# Patient Record
Sex: Male | Born: 1960 | Race: Black or African American | Hispanic: No | Marital: Single | Smoking: Former smoker
Health system: Southern US, Community
[De-identification: ages and names within clinical notes are randomized; demographics above are authoritative.]

## PROBLEM LIST (undated history)

## (undated) DIAGNOSIS — B1920 Unspecified viral hepatitis C without hepatic coma: Secondary | ICD-10-CM

## (undated) DIAGNOSIS — D649 Anemia, unspecified: Secondary | ICD-10-CM

## (undated) DIAGNOSIS — I2699 Other pulmonary embolism without acute cor pulmonale: Secondary | ICD-10-CM

## (undated) DIAGNOSIS — C220 Liver cell carcinoma: Secondary | ICD-10-CM

## (undated) DIAGNOSIS — M109 Gout, unspecified: Secondary | ICD-10-CM

## (undated) HISTORY — DX: Gout, unspecified: M10.9

---

## 2006-07-06 ENCOUNTER — Emergency Department: Payer: Self-pay | Admitting: Emergency Medicine

## 2012-07-17 ENCOUNTER — Emergency Department: Payer: Self-pay | Admitting: Emergency Medicine

## 2013-03-02 ENCOUNTER — Emergency Department: Payer: Self-pay | Admitting: Internal Medicine

## 2017-10-07 ENCOUNTER — Emergency Department
Admission: EM | Admit: 2017-10-07 | Discharge: 2017-10-07 | Disposition: A | Discharge status: Home / Self Care | Payer: Medicaid Other | Attending: Emergency Medicine | Admitting: Emergency Medicine

## 2017-10-07 ENCOUNTER — Emergency Department: Payer: Medicaid Other

## 2017-10-07 ENCOUNTER — Encounter: Payer: Self-pay | Admitting: Emergency Medicine

## 2017-10-07 ENCOUNTER — Other Ambulatory Visit: Payer: Self-pay

## 2017-10-07 DIAGNOSIS — W3182XA Contact with other commercial machinery, initial encounter: Secondary | ICD-10-CM | POA: Insufficient documentation

## 2017-10-07 DIAGNOSIS — S63297A Dislocation of distal interphalangeal joint of left little finger, initial encounter: Secondary | ICD-10-CM | POA: Diagnosis not present

## 2017-10-07 DIAGNOSIS — F17210 Nicotine dependence, cigarettes, uncomplicated: Secondary | ICD-10-CM | POA: Insufficient documentation

## 2017-10-07 DIAGNOSIS — Y939 Activity, unspecified: Secondary | ICD-10-CM | POA: Diagnosis not present

## 2017-10-07 DIAGNOSIS — Y929 Unspecified place or not applicable: Secondary | ICD-10-CM | POA: Diagnosis not present

## 2017-10-07 DIAGNOSIS — S63259A Unspecified dislocation of unspecified finger, initial encounter: Secondary | ICD-10-CM

## 2017-10-07 DIAGNOSIS — Y999 Unspecified external cause status: Secondary | ICD-10-CM | POA: Insufficient documentation

## 2017-10-07 DIAGNOSIS — S61217A Laceration without foreign body of left little finger without damage to nail, initial encounter: Secondary | ICD-10-CM

## 2017-10-07 DIAGNOSIS — S6992XA Unspecified injury of left wrist, hand and finger(s), initial encounter: Secondary | ICD-10-CM | POA: Diagnosis present

## 2017-10-07 MED ORDER — HYDROCODONE-ACETAMINOPHEN 5-325 MG PO TABS: 1.0000 | ORAL_TABLET | ORAL | 0 refills | Status: DC | PRN

## 2017-10-07 MED ORDER — MORPHINE SULFATE (PF) 4 MG/ML IV SOLN
4.0000 mg | Freq: Once | INTRAVENOUS | Status: AC
  Administered 2017-10-07: 4 mg via INTRAVENOUS

## 2017-10-07 MED ORDER — CEPHALEXIN 500 MG PO CAPS
500.0000 mg | ORAL_CAPSULE | Freq: Once | ORAL | Status: AC
  Administered 2017-10-07: 500 mg via ORAL
  Filled 2017-10-07: qty 1

## 2017-10-07 MED ORDER — LIDOCAINE HCL (PF) 1 % IJ SOLN
INTRAMUSCULAR | Status: AC
  Filled 2017-10-07: qty 10

## 2017-10-07 MED ORDER — TETANUS-DIPHTH-ACELL PERTUSSIS 5-2.5-18.5 LF-MCG/0.5 IM SUSP
0.5000 mL | Freq: Once | INTRAMUSCULAR | Status: DC
  Filled 2017-10-07: qty 0.5

## 2017-10-07 MED ORDER — ONDANSETRON HCL 4 MG/2ML IJ SOLN
4.0000 mg | Freq: Once | INTRAMUSCULAR | Status: AC
  Administered 2017-10-07: 4 mg via INTRAVENOUS

## 2017-10-07 MED ORDER — MORPHINE SULFATE (PF) 4 MG/ML IV SOLN
INTRAVENOUS | Status: AC
  Administered 2017-10-07: 4 mg via INTRAVENOUS
  Filled 2017-10-07: qty 1

## 2017-10-07 MED ORDER — CEPHALEXIN 500 MG PO CAPS: 500.0000 mg | ORAL_CAPSULE | Freq: Three times a day (TID) | ORAL | 0 refills | Status: DC

## 2017-10-07 MED ORDER — ONDANSETRON HCL 4 MG/2ML IJ SOLN
INTRAMUSCULAR | Status: AC
  Administered 2017-10-07: 4 mg via INTRAVENOUS
  Filled 2017-10-07: qty 2

## 2017-10-07 NOTE — ED Notes (Signed)
Pt discharged to appointment with [facility] after verbalizing understanding of discharge instructions; nad noted.

## 2017-10-07 NOTE — ED Notes (Signed)
Pt presents with hand wrapped. When he arrived in room, stated that it is no longer numb and "hurts real bad." Pt writhing around and diaphoretic during assessment.

## 2017-10-07 NOTE — ED Notes (Signed)
Pt hand wrapped and finger splint placed.

## 2017-10-07 NOTE — ED Triage Notes (Signed)
Pt got left fourth and fifth digit caught in a drill, bone protruding through skin, bleeding control, hand is pink, positive pulses, pt reports fingers are numb

## 2017-10-07 NOTE — Discharge Instructions (Signed)
You have been seen in the emergency department following a dislocation of your fourth and fifth fingers of her left hand.  The dislocation was reduced in both fingers however as you did suffer from a laceration with exposed bone to the left fifth finger please proceed directly to orthopedics for further evaluation, they are expecting her arrival.  Your tetanus vaccine has been updated today.  Please take your antibiotics and pain medication as written.  Return to the emergency department for any significant increase in pain, any numbness or coldness of the fingers, or any signs of infection such as increased redness, pus or fever.

## 2017-10-07 NOTE — ED Provider Notes (Signed)
[facility] Emergency Department Provider Note  Time seen: 9:21 AM  I have reviewed the triage vital signs and the nursing notes.   HISTORY  Chief Complaint Laceration    HPI Vernon [name] is a 56 y.o. male with no past medical history who presents to the emergency department for left hand pain.  According to the patient he was using a drill mixer.  He states his left gloved hand got stuck in the drill and was pulled into the drill mixer.  Patient states significant pain in the left hand denies any other pain or injuries.  Largely negative review of systems.  History reviewed. No pertinent past medical history.  There are no active problems to display for this patient.   History reviewed. No pertinent surgical history.  Prior to Admission medications   Not on File    No Known Allergies  No family history on file.  Social History Social History   Tobacco Use  . Smoking status: Current Every Day Smoker    Packs/day: 1.00    Types: Cigarettes  Substance Use Topics  . Alcohol use: Yes  . Drug use: Not on file    Review of Systems Constitutional: Negative for LOC Cardiovascular: Negative for chest pain. Respiratory: Negative for shortness of breath. Gastrointestinal: Negative for abdominal pain Musculoskeletal: Left hand pain Neurological: Negative for headache All other ROS negative  ____________________________________________   PHYSICAL EXAM:  Constitutional: Alert and oriented. Well appearing, mild distress due to pain Eyes: Normal exam ENT   Head: Normocephalic and atraumatic.   Mouth/Throat: Mucous membranes are moist. Cardiovascular: Normal rate, regular rhythm. No murmur Respiratory: Normal respiratory effort without tachypnea nor retractions. Breath sounds are clear Gastrointestinal: Soft and nontender. No distention.  Musculoskeletal: Patient has deformity to the left fourth finger, no laceration.  Neurovascularly  intact distally with good cap refill and states normal sensation.  Patient has a deformed left fifth finger with a laceration overlying the PIP joint, with exposed tendon.  Neurovascularly intact distally with good cap refill and normal sensation. Neurologic:  Normal speech and language. No gross focal neurologic deficits Skin:  Skin is warm, dry.  1.5 cm laceration over the palmar aspect of the fifth finger/PIP area. Psychiatric: Mood and affect are normal.   ____________________________________________   RADIOLOGY  IMPRESSION: Dislocations at the fourth and fifth PIP joint levels with dislocation dorsally of the fourth and fifth middle and distal phalanges with respect to the root proximal phalanges. Small avulsion, probably arising from the proximal aspect of the fifth middle phalanx with the small avulsed fragment noted volar to the distal aspect of the fifth proximal phalanx.  No other fractures or dislocation. Areas of osteoarthritic change laterally.  ____________________________________________   INITIAL IMPRESSION / ASSESSMENT AND PLAN / ED COURSE  Pertinent labs & imaging results that were available during my care of the patient were reviewed by me and considered in my medical decision making (see chart for details).  The patient presents to the emergency department after getting his hand stuck in drill mixer.  Differential includes laceration, dislocation, fracture, tendon/ligament injury.  On exam patient appears to have a dislocation of the fourth finger with a dislocation possible fracture of the fifth finger with a laceration over the fifth finger of the left hand.  Both of which are neurovascular intact with good capillary refill and normal sensation.  We will obtain an x-ray.  I performed a digital block on the fourth and fifth fingers for pain  relief.  We will also dose pain medication and continue to closely monitor in the emergency department.  Patient agreeable to  this plan.  X-ray shows dislocations of the fourth and fifth digits without fracture.  After digital block I was able to reduce the fourth digit.  Extensively irrigated the fifth PIP joint and extruding bone prior to reduction.  After the digits were reduced I again assessed each joint and all tendons appeared to be intact patient able to flex each phalanx when the joint was held stable.  Continues to have good capillary refill.  Patient was neurologically intact prior to digital blocks.  I discussed the patient with orthopedics who recommends suturing the laceration and having the patient proceed directly to their office across the street for evaluation.  Patient is agreeable to this plan.  He is not sure when his last tetanus shot was we will update his tetanus in the emergency department.  We will place the patient on pain medication as well as antibiotics as the bone was protruding from the skin.  The patient understands he is to proceed directly to [facility] for further evaluation.  Reduction of dislocation Date/Time: 10:03 AM Performed by: Harvest Dark Authorized by: Harvest Dark Consent: Verbal consent obtained. Consent given by: patient Required items: required equipment available  Patient sedated: No (Digital block  Vitals: Vital signs were monitored during sedation. Patient tolerance: Patient tolerated the procedure well with no immediate complications. Joint: left 4th and 5th PIPs Reduction technique: traction  LACERATION REPAIR Performed by: Harvest Dark Authorized by: Harvest Dark Consent: Verbal consent obtained. Risks and benefits: risks, benefits and alternatives were discussed Consent given by: patient Patient identity confirmed: provided demographic data Prepped and Draped in normal sterile fashion Wound explored  Laceration Location: left 5th PIP   Laceration Length: 1.5cm  No Foreign Bodies seen or palpated  Anesthesia: local  infiltration  Local anesthetic: lidocaine 1% w/o epinephrine (digital block)  Anesthetic total: 4 ml  Irrigation method: syringe Amount of cleaning: standard  Skin closure: 4-0 prolene  Number of sutures: 4  Technique: simple interrupted  Patient tolerance: Patient tolerated the procedure well with no immediate complications.     ____________________________________________   FINAL CLINICAL IMPRESSION(S) / ED DIAGNOSES  Left hand pain    Harvest Dark, MD 10/07/17 1005

## 2017-12-20 ENCOUNTER — Other Ambulatory Visit: Payer: Self-pay | Admitting: *Deleted

## 2017-12-20 ENCOUNTER — Ambulatory Visit
Admission: RE | Admit: 2017-12-20 | Discharge: 2017-12-20 | Disposition: A | Discharge status: Home / Self Care | Payer: Disability Insurance | Source: Ambulatory Visit | Attending: Physician Assistant | Admitting: Physician Assistant

## 2017-12-20 ENCOUNTER — Ambulatory Visit
Admission: RE | Admit: 2017-12-20 | Discharge: 2017-12-20 | Disposition: A | Discharge status: Home / Self Care | Payer: Disability Insurance | Source: Ambulatory Visit | Attending: *Deleted | Admitting: *Deleted

## 2017-12-20 DIAGNOSIS — M25561 Pain in right knee: Secondary | ICD-10-CM

## 2017-12-20 DIAGNOSIS — M25562 Pain in left knee: Secondary | ICD-10-CM | POA: Insufficient documentation

## 2018-05-25 ENCOUNTER — Emergency Department
Admission: EM | Admit: 2018-05-25 | Discharge: 2018-05-25 | Disposition: A | Discharge status: Home / Self Care | Payer: Medicaid Other | Attending: Emergency Medicine | Admitting: Emergency Medicine

## 2018-05-25 ENCOUNTER — Encounter: Payer: Self-pay | Admitting: Emergency Medicine

## 2018-05-25 ENCOUNTER — Other Ambulatory Visit: Payer: Self-pay

## 2018-05-25 DIAGNOSIS — M25571 Pain in right ankle and joints of right foot: Secondary | ICD-10-CM | POA: Diagnosis present

## 2018-05-25 DIAGNOSIS — Z79899 Other long term (current) drug therapy: Secondary | ICD-10-CM | POA: Diagnosis not present

## 2018-05-25 DIAGNOSIS — M10071 Idiopathic gout, right ankle and foot: Secondary | ICD-10-CM | POA: Insufficient documentation

## 2018-05-25 DIAGNOSIS — F17210 Nicotine dependence, cigarettes, uncomplicated: Secondary | ICD-10-CM | POA: Insufficient documentation

## 2018-05-25 DIAGNOSIS — R7309 Other abnormal glucose: Secondary | ICD-10-CM

## 2018-05-25 LAB — CBC WITH DIFFERENTIAL/PLATELET
BASOS ABS: 0 10*3/uL (ref 0–0.1)
BASOS PCT: 0 %
EOS ABS: 0.1 10*3/uL (ref 0–0.7)
Eosinophils Relative: 3 %
HEMATOCRIT: 40.5 % (ref 40.0–52.0)
HEMOGLOBIN: 13.7 g/dL (ref 13.0–18.0)
Lymphocytes Relative: 34 %
Lymphs Abs: 1.6 10*3/uL (ref 1.0–3.6)
MCH: 34.5 pg — ABNORMAL HIGH (ref 26.0–34.0)
MCHC: 33.9 g/dL (ref 32.0–36.0)
MCV: 101.7 fL — ABNORMAL HIGH (ref 80.0–100.0)
Monocytes Absolute: 0.5 10*3/uL (ref 0.2–1.0)
Monocytes Relative: 10 %
NEUTROS ABS: 2.5 10*3/uL (ref 1.4–6.5)
Neutrophils Relative %: 53 %
Platelets: 105 10*3/uL — ABNORMAL LOW (ref 150–440)
RBC: 3.98 MIL/uL — AB (ref 4.40–5.90)
RDW: 14.1 % (ref 11.5–14.5)
WBC: 4.7 10*3/uL (ref 3.8–10.6)

## 2018-05-25 LAB — BASIC METABOLIC PANEL
Anion gap: 9 (ref 5–15)
BUN: 25 mg/dL — ABNORMAL HIGH (ref 6–20)
CHLORIDE: 103 mmol/L (ref 98–111)
CO2: 26 mmol/L (ref 22–32)
Calcium: 9.4 mg/dL (ref 8.9–10.3)
Creatinine, Ser: 1.21 mg/dL (ref 0.61–1.24)
GFR calc Af Amer: >60 mL/min (ref >60)
GFR calc non Af Amer: >60 mL/min (ref >60)
GLUCOSE: 131 mg/dL — AB (ref 70–99)
POTASSIUM: 5 mmol/L (ref 3.5–5.1)
Sodium: 138 mmol/L (ref 135–145)

## 2018-05-25 LAB — URINALYSIS, COMPLETE (UACMP) WITH MICROSCOPIC
BILIRUBIN URINE: NEGATIVE
Bacteria, UA: NONE SEEN
GLUCOSE, UA: NEGATIVE mg/dL
HGB URINE DIPSTICK: NEGATIVE
KETONES UR: NEGATIVE mg/dL
LEUKOCYTES UA: NEGATIVE
NITRITE: NEGATIVE
PH: 5 (ref 5.0–8.0)
Protein, ur: NEGATIVE mg/dL
SPECIFIC GRAVITY, URINE: 1.016 (ref 1.005–1.030)
Squamous Epithelial / LPF: NONE SEEN (ref 0–5)

## 2018-05-25 LAB — SEDIMENTATION RATE: SED RATE: 9 mm/h (ref 0–20)

## 2018-05-25 LAB — URIC ACID: URIC ACID, SERUM: 9.6 mg/dL — AB (ref 3.7–8.6)

## 2018-05-25 MED ORDER — COLCHICINE 0.6 MG PO TABS: 0.6000 mg | ORAL_TABLET | Freq: Every day | ORAL | 0 refills | Status: DC

## 2018-05-25 MED ORDER — SODIUM CHLORIDE 0.9 % IV BOLUS
1000.0000 mL | Freq: Once | INTRAVENOUS | Status: AC
  Administered 2018-05-25: 1000 mL via INTRAVENOUS

## 2018-05-25 MED ORDER — CYCLOBENZAPRINE HCL 10 MG PO TABS: 10.0000 mg | ORAL_TABLET | Freq: Three times a day (TID) | ORAL | 0 refills | Status: DC | PRN

## 2018-05-25 NOTE — ED Provider Notes (Addendum)
[facility] Emergency Department Provider Note   ____________________________________________   First MD Initiated Contact with Patient 05/25/18 1057     (approximate)  I have reviewed the triage vital signs and the nursing notes.   HISTORY  Chief Complaint Leg Pain    HPI Vernon [name] is a 57 y.o. male patient complained of 3 weeks of bilateral leg and ankle cramping.  Patient stated no history of provocative incident except for working outside.  Patient denies dyspnea, chest pain or leg numbness.  Patient has degenerative changes in the patellofemoral compartments bilaterally.  Patient rates his pain discomfort a 10/10.  Patient described the pain as "crampy".  No relief over-the-counter anti-inflammatory medications.   History reviewed. No pertinent past medical history.  There are no active problems to display for this patient.   History reviewed. No pertinent surgical history.  Prior to Admission medications   Medication Sig Start Date End Date Taking? Authorizing Provider  cephALEXin (KEFLEX) 500 MG capsule Take 1 capsule (500 mg total) 3 (three) times daily by mouth. 10/07/17   Harvest Dark, MD  colchicine 0.6 MG tablet Take 1 tablet (0.6 mg total) by mouth daily. 05/25/18 05/25/19  Sable Feil, PA-C  cyclobenzaprine (FLEXERIL) 10 MG tablet Take 1 tablet (10 mg total) by mouth 3 (three) times daily as needed. 05/25/18   Sable Feil, PA-C  HYDROcodone-acetaminophen (NORCO/VICODIN) 5-325 MG tablet Take 1 tablet every 4 (four) hours as needed by mouth. 10/07/17   Harvest Dark, MD    Allergies Patient has no known allergies.  No family history on file.  Social History Social History   Tobacco Use  . Smoking status: Current Every Day Smoker    Packs/day: 1.00    Types: Cigarettes  . Smokeless tobacco: Former Network engineer Use Topics  . Alcohol use: Yes  . Drug use: Not on file    Review of Systems Constitutional: No  fever/chills Eyes: No visual changes. ENT: No sore throat. Cardiovascular: Denies chest pain. Respiratory: Denies shortness of breath. Gastrointestinal: No abdominal pain.  No nausea, no vomiting.  No diarrhea.  No constipation. Genitourinary: Negative for dysuria. Musculoskeletal: Bilateral leg cramping.   Skin: Negative for rash. Neurological: Negative for headaches, focal weakness or numbness.   ____________________________________________   PHYSICAL EXAM:  VITAL SIGNS: ED Triage Vitals  Enc Vitals Group     BP 05/25/18 1015 (!) 128/114     Pulse Rate 05/25/18 1013 (!) 104     Resp --      Temp 05/25/18 1013 98.2 F (36.8 C)     Temp Source 05/25/18 1013 Oral     SpO2 05/25/18 1013 98 %     Weight 05/25/18 1014 170 lb (77.1 kg)     Height 05/25/18 1014 5\' 6"  (1.676 m)     Head Circumference --      Peak Flow --      Pain Score 05/25/18 1020 10     Pain Loc --      Pain Edu? --      Excl. in [place]? --    Constitutional: Alert and oriented. Well appearing and in no acute distress. Neck: No stridor. Cardiovascular: Normal rate, regular rhythm. Grossly normal heart sounds.  Good peripheral circulation. Respiratory: Normal respiratory effort.  No retractions. Lungs CTAB. Musculoskeletal: No lower extremity tenderness nor edema.  No joint effusions. Neurologic:  Normal speech and language. No gross focal neurologic deficits are appreciated. No gait instability. Skin:  Skin is warm, dry and intact. No rash noted. Psychiatric: Mood and affect are normal. Speech and behavior are normal.  ____________________________________________   LABS (all labs ordered are listed, but only abnormal results are displayed)  Labs Reviewed  BASIC METABOLIC PANEL - Abnormal; Notable for the following components:      Result Value   Glucose, Bld 131 (*)    BUN 25 (*)    All other components within normal limits  CBC WITH DIFFERENTIAL/PLATELET - Abnormal; Notable for the following  components:   RBC 3.98 (*)    MCV 101.7 (*)    MCH 34.5 (*)    Platelets 105 (*)    All other components within normal limits  URIC ACID - Abnormal; Notable for the following components:   Uric Acid, Serum 9.6 (*)    All other components within normal limits  URINALYSIS, COMPLETE (UACMP) WITH MICROSCOPIC - Abnormal; Notable for the following components:   Color, Urine YELLOW (*)    APPearance CLEAR (*)    All other components within normal limits  SEDIMENTATION RATE   ____________________________________________  EKG   ____________________________________________  RADIOLOGY  ED MD interpretation:    Official radiology report(s): No results found.  ____________________________________________   PROCEDURES  Procedure(s) performed: None  Procedures  Critical Care performed: No  ____________________________________________   INITIAL IMPRESSION / ASSESSMENT AND PLAN / ED COURSE  As part of my medical decision making, I reviewed the following data within the [place]    Patient presents with bilateral leg pain mostly around the ankles.  Patient also complaining of leg cramps.  Discussed lab results with patient with concern for elevated glucose, uric acid and BUN.  Patient was rehydrated with 1000 cc of normal saline.  Patient advised to establish care with the [facility] for further evaluation.  Patient given a prescription for colchicine and Flexeril.     ____________________________________________   FINAL CLINICAL IMPRESSION(S) / ED DIAGNOSES  Final diagnoses:  Idiopathic gout of right ankle, unspecified chronicity  Elevated glucose     ED Discharge Orders        Ordered    colchicine 0.6 MG tablet  Daily     05/25/18 1245    cyclobenzaprine (FLEXERIL) 10 MG tablet  3 times daily PRN     05/25/18 1245       Note:  This document was prepared using Dragon voice recognition software and may include unintentional  dictation errors.    Sable Feil, PA-C 05/25/18 1248    Sable Feil, PA-C 05/25/18 1249    Earleen Newport, MD 05/25/18 [phone]

## 2018-05-25 NOTE — Discharge Instructions (Addendum)
Advised to contact the [facility] to schedule appointment to establish care and further evaluate elevated glucose and uric acid.

## 2018-05-25 NOTE — ED Triage Notes (Signed)
Pt states left leg and ankle pain for a few weeks now, denies injury to area.

## 2018-05-25 NOTE — ED Notes (Signed)
See triage note  Presents with pain to left leg  Stats cramping started at feet and moves up into lag  Sx's started several weeks ago  No injury  Ambulates well to room

## 2018-06-13 ENCOUNTER — Ambulatory Visit (INDEPENDENT_AMBULATORY_CARE_PROVIDER_SITE_OTHER): Payer: Medicaid Other | Admitting: Nurse Practitioner

## 2018-06-13 ENCOUNTER — Encounter: Payer: Self-pay | Admitting: Nurse Practitioner

## 2018-06-13 ENCOUNTER — Other Ambulatory Visit: Payer: Self-pay

## 2018-06-13 VITALS — BP 114/72 | HR 100 | Temp 98.5°F | Ht 66.0 in | Wt 161.2 lb

## 2018-06-13 DIAGNOSIS — M109 Gout, unspecified: Secondary | ICD-10-CM | POA: Diagnosis not present

## 2018-06-13 DIAGNOSIS — R945 Abnormal results of liver function studies: Secondary | ICD-10-CM

## 2018-06-13 DIAGNOSIS — Z13220 Encounter for screening for lipoid disorders: Secondary | ICD-10-CM | POA: Diagnosis not present

## 2018-06-13 DIAGNOSIS — R42 Dizziness and giddiness: Secondary | ICD-10-CM

## 2018-06-13 DIAGNOSIS — R160 Hepatomegaly, not elsewhere classified: Secondary | ICD-10-CM

## 2018-06-13 DIAGNOSIS — D7589 Other specified diseases of blood and blood-forming organs: Secondary | ICD-10-CM

## 2018-06-13 DIAGNOSIS — R7989 Other specified abnormal findings of blood chemistry: Secondary | ICD-10-CM

## 2018-06-13 DIAGNOSIS — K769 Liver disease, unspecified: Secondary | ICD-10-CM

## 2018-06-13 MED ORDER — ALLOPURINOL 100 MG PO TABS: 100.0000 mg | ORAL_TABLET | Freq: Every day | ORAL | 6 refills | Status: DC

## 2018-06-13 NOTE — Progress Notes (Signed)
Subjective:    Patient ID: Vernon [name], male    DOB: [birth year], 59 y.o.   MRN: [id number]  Vernon [name] is a 57 y.o. male presenting on 06/13/2018 for Leg Pain (bilateral leg and ankle cramping mostly in the left x 5 weeks. Left leg cramping is worse at bedtime) and Establish Care (dizziness w/ sudden movement. x 6 mths)   HPI Establish Care New Provider Pt never seen by PCP in recent or remote past.  Patient was most recently cared for in ED at [facility] for episodic care.   Dizziness occus intermittently and not every day.  Over last 6 weeks is more regular.  Started about 6 months ago with very intermittent symptoms. - Is drinking water regularly at least 1/2 - 1 gallon daily.  History of Gout - Takes colchicine about 4 times per week since flare 2 weeks ago. - Patient has not had any uric acid level drawn or preventative medication in past for gout prevention.  Macrocytosis Found on CBC in ED.  No prior treatment.  No other workup.  Patient has no prior knowledge about this change.  He is regular consumer of alcohol and is reluctant to share his alcohol drinking history.  Currently drinks 1-2 beer per night, but these are 40-oz beers. - Eats lunch, breakfast occasionally, supper daily.  Supper is usually very small meal.  He never eats meals out.  Prepares food at home.   - Has meat product daily.  Past Medical History:  Diagnosis Date  . Gout    History reviewed. No pertinent surgical history.   Social History   Socioeconomic History  . Marital status: Single    Spouse name: Not on file  . Number of children: 3  . Years of education: 10  . Highest education level: 10th grade  Occupational History  . 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 Financial resource strain: Somewhat hard  . Food insecurity:    Worry: Sometimes true    Inability: Sometimes true  . Transportation needs:    Medical: No    Non-medical: No  Tobacco  Use  . Smoking status: Current Every Day Smoker    Packs/day: 1.00    Types: Cigarettes  . Smokeless tobacco: Former Network engineer and Sexual Activity  . Alcohol use: Yes    Alcohol/week: 4.2 oz    Types: 7 Cans of beer per week    Comment: daily  . Drug use: Never  . Sexual activity: Not Currently  Lifestyle  . Physical activity:    Days per week: 2 days    Minutes per session: Not on file  . Stress: To some extent  Relationships  . Social connections:    Talks on phone: Not on file    Gets together: Not on file    Attends religious service: Not on file    Active member of club or organization: Not on file    Attends meetings of clubs or organizations: Not on file    Relationship status: Not on file  . Intimate partner violence:    Fear of current or ex partner: Not on file    Emotionally abused: Not on file    Physically abused: Not on file    Forced sexual activity: Not on file  Other Topics Concern  . Not on file  Social History Narrative  . Not on file   Family History  Problem Relation Age  of Onset  . Cancer Mother   . Cancer Father   . Hypertension Maternal Uncle   . Healthy Daughter   . Healthy Son    Current Outpatient Medications on File Prior to Visit  Medication Sig  . colchicine 0.6 MG tablet Take 1 tablet (0.6 mg total) by mouth daily.  . cyclobenzaprine (FLEXERIL) 10 MG tablet Take 1 tablet (10 mg total) by mouth 3 (three) times daily as needed. (Patient not taking: Reported on 06/13/2018)   No current facility-administered medications on file prior to visit.     Review of Systems Per HPI unless specifically indicated above     Objective:    BP 114/72 (BP Location: Right Arm)   Pulse 100   Temp 98.5 F (36.9 C) (Oral)   Ht 5\' 6"  (1.676 m)   Wt 161 lb 3.2 oz (73.1 kg)   SpO2 97%   BMI 26.02 kg/m   Wt Readings from Last 3 Encounters:  06/13/18 161 lb 3.2 oz (73.1 kg)  05/25/18 170 lb (77.1 kg)  10/07/17 170 lb (77.1 kg)    Physical  Exam  Constitutional: He is oriented to person, place, and time. He appears well-developed. No distress.  Malnourished appearance.  Sunken temples.  Normal body habitus.  HENT:  Head: Normocephalic and atraumatic.  Cardiovascular: Normal rate, regular rhythm, S1 normal, S2 normal, normal heart sounds and intact distal pulses.  Pulmonary/Chest: Effort normal and breath sounds normal. No respiratory distress.  Abdominal: Soft. Bowel sounds are normal. He exhibits no distension. There is no hepatosplenomegaly. There is no tenderness. No hernia.  Neurological: He is alert and oriented to person, place, and time.  Skin: Skin is warm and dry.  Psychiatric: He has a normal mood and affect. His behavior is normal.  Vitals reviewed.    Results for orders placed or performed in visit on 06/13/18  COMPLETE METABOLIC PANEL WITH GFR  Result Value Ref Range   Glucose, Bld 123 (H) 65 - 99 mg/dL   BUN 15 7 - 25 mg/dL   Creat 1.15 0.70 - 1.33 mg/dL   GFR, Est Non African American 69 > OR = 60 mL/min/1.58m2   GFR, Est African American 80 > OR = 60 mL/min/1.2m2   BUN/Creatinine Ratio NOT APPLICABLE 6 - 22 (calc)   Sodium 139 135 - 146 mmol/L   Potassium 5.4 (H) 3.5 - 5.3 mmol/L   Chloride 104 98 - 110 mmol/L   CO2 29 20 - 32 mmol/L   Calcium 9.4 8.6 - 10.3 mg/dL   Total Protein 7.7 6.1 - 8.1 g/dL   Albumin 4.0 3.6 - 5.1 g/dL   Globulin 3.7 1.9 - 3.7 g/dL (calc)   AG Ratio 1.1 1.0 - 2.5 (calc)   Total Bilirubin 0.9 0.2 - 1.2 mg/dL   Alkaline phosphatase (APISO) 115 40 - 115 U/L   AST 122 (H) 10 - 35 U/L   ALT 83 (H) 9 - 46 U/L  Vitamin B12  Result Value Ref Range   Vitamin B-12 558 200 - 1,100 pg/mL  Folate  Result Value Ref Range   Folate 8.2 ng/mL  Hemoglobin A1c  Result Value Ref Range   Hgb A1c MFr Bld 4.9 <5.7 % of total Hgb   Mean Plasma Glucose 94 (calc)   eAG (mmol/L) 5.2 (calc)  TSH  Result Value Ref Range   TSH 1.32 0.40 - 4.50 mIU/L  Iron, TIBC and Ferritin Panel  Result  Value Ref Range   Iron 250 (H)  50 - 180 mcg/dL   TIBC 480 (H) 250 - 425 mcg/dL (calc)   %SAT 52 (H) 20 - 48 % (calc)   Ferritin 254 38 - 380 ng/mL  Lipid panel  Result Value Ref Range   Cholesterol 106 <200 mg/dL   HDL 59 >40 mg/dL   Triglycerides 101 <150 mg/dL   LDL Cholesterol (Calc) 29 mg/dL (calc)   Total CHOL/HDL Ratio 1.8 <5.0 (calc)   Non-HDL Cholesterol (Calc) 47 <130 mg/dL (calc)      Assessment & Plan:   Problem List Items Addressed This Visit    None    Visit Diagnoses    Acute gout of left knee, unspecified cause    -  Primary   Relevant Medications   allopurinol (ZYLOPRIM) 100 MG tablet   Other Relevant Orders   COMPLETE METABOLIC PANEL WITH GFR (Completed)   Dizziness       Relevant Orders   COMPLETE METABOLIC PANEL WITH GFR (Completed)   Vitamin B12 (Completed)   Folate (Completed)   Hemoglobin A1c (Completed)   TSH (Completed)   Iron, TIBC and Ferritin Panel   Lipid screening       Relevant Orders   Lipid panel   Elevated LFTs       Relevant Orders   Ambulatory referral to Gastroenterology   US Abdomen Limited RUQ   Macrocytosis without anemia       Relevant Orders   Ambulatory referral to Gastroenterology   US Abdomen Limited RUQ        # Establish Care No previous PCP. Records in [facility] reviewed.  Past medical, family, and surgical history reviewed w/ patient in clinic.  # Gout Stable and resolving.  Last prior uric acid level elevated.  No preventative medication.  - Stop colchicine after acute flare is resolved. - Start allopurinol 100 mg once daily.  - Recheck uric acid level 4-6 weeks.  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  Cannot exclude nutritional deficiencies. - Labs today: indicate this is macrocytosis without anemia or deficiencies - RUQ [place] evaluate liver in presence of elevated LFTs - Referral GI - Followup prn   # Dizziness Subacute to chronic problem without prior diagnostic  workup.  Cause is currently unknown. - Labs today as ordered above. - Continue adequate hydration with water. - Encouraged good nutrition with regular meals - Followup after labs and if symptoms persist.    Meds ordered this encounter  Medications  . allopurinol (ZYLOPRIM) 100 MG tablet    Sig: Take 1 tablet (100 mg total) by mouth daily.    Dispense:  30 tablet    Refill:  6    Order Specific Question:   Supervising Provider    Answer:   Olin Hauser [[id number]]     Follow up plan: Return in about 1 month (around 07/11/2018) for restless legs.  Cassell Smiles, DNP, AGPCNP-BC Adult Gerontology Primary Care Nurse Practitioner [facility] 06/21/2018, 8:05 AM

## 2018-06-13 NOTE — Patient Instructions (Addendum)
Vernon [name],   Thank you for coming in to clinic today.  1. You are continuing to have gout pain.  Continue colchicine until your pain is gone. - START allopurinol 100 mg once daily to prevent gout.  2. You may also have restless leg syndrome.  We will start medication in about 4 weeks for your as long as your dizziness is getting better.  Sometimes these medications worsen dizziness.  Please schedule a follow-up appointment with Cassell Smiles, AGNP. Return in about 1 month (around 07/11/2018) for restless legs.  If you have any other questions or concerns, please feel free to call the clinic or send a message through [facility]. You may also schedule an earlier appointment if necessary.  You will receive a survey after today's visit either digitally by e-mail or paper by [employer]. Your experiences and feedback matter to [place].  Please respond so we know how we are doing as we provide care for you.   Cassell Smiles, DNP, AGNP-BC Adult Gerontology Nurse Practitioner [facility], [facility]   Restless Legs Syndrome Restless legs syndrome is a condition that causes uncomfortable feelings or sensations in the legs, especially while sitting or lying down. The sensations usually cause an overwhelming urge to move the legs. The arms can also sometimes be affected. The condition can range from mild to severe. The symptoms often interfere with a person's ability to sleep. What are the causes? The cause of this condition is not known. What increases the risk? This condition is more likely to develop in:  People who are older than age 13.  Pregnant women. In general, restless legs syndrome is more common in women than in men.  People who have a family history of the condition.  People who have certain medical conditions, such as iron deficiency, kidney disease, Parkinson disease, or nerve damage.  People who take certain medicines, such as medicines for high blood pressure, nausea,  colds, allergies, depression, and some heart conditions.  What are the signs or symptoms? The main symptom of this condition is uncomfortable sensations in the legs. These sensations may be:  Described as pulling, tingling, prickling, throbbing, crawling, or burning.  Worse while you are sitting or lying down.  Worse during periods of rest or inactivity.  Worse at night, often interfering with your sleep.  Accompanied by a very strong urge to move your legs.  Temporarily relieved by movement of your legs.  The sensations usually affect both sides of the body. The arms can also be affected, but this is rare. People who have this condition often have tiredness during the day because of their lack of sleep at night. How is this diagnosed? This condition may be diagnosed based on your description of the symptoms. You may also have tests, including blood tests, to check for other conditions that may lead to your symptoms. In some cases, you may be asked to spend some time in a sleep lab so your sleeping can be monitored. How is this treated? Treatment for this condition is focused on managing the symptoms. Treatment may include:  Self-help and lifestyle changes.  Medicines.  Follow these instructions at home:  Take medicines only as directed by your health care provider.  Try these methods to get temporary relief from the uncomfortable sensations: ? Massage your legs. ? Walk or stretch. ? Take a cold or hot bath.  Practice good sleep habits. For example, go to bed and get up at the same time every  day.  Exercise regularly.  Practice ways of relaxing, such as yoga or meditation.  Avoid caffeine and alcohol.  Do not use any tobacco products, including cigarettes, chewing tobacco, or electronic cigarettes. If you need help quitting, ask your health care provider.  Keep all follow-up visits as directed by your health care provider. This is important. Contact a health care  provider if: Your symptoms do not improve with treatment, or they get worse. This information is not intended to replace advice given to you by your health care provider. Make sure you discuss any questions you have with your health care provider. Document Released: 11/06/2002 Document Revised: 04/23/2016 Document Reviewed: 11/12/2014 Elsevier Interactive Patient Education  2018 [employer].    Gout Gout is painful swelling that can happen in some of your joints. Gout is a type of arthritis. This condition is caused by having too much uric acid in your body. Uric acid is a chemical that is made when your body breaks down substances called purines. If your body has too much uric acid, sharp crystals can form and build up in your joints. This causes pain and swelling. Gout attacks can happen quickly and be very painful (acute gout). Over time, the attacks can affect more joints and happen more often (chronic gout). Follow these instructions at home: During a Gout Attack  If directed, put ice on the painful area: ? Put ice in a plastic bag. ? Place a towel between your skin and the bag. ? Leave the ice on for 20 minutes, 2-3 times a day.  Rest the joint as much as possible. If the joint is in your leg, you may be given crutches to use.  Raise (elevate) the painful joint above the level of your heart as often as you can.  Drink enough fluids to keep your pee (urine) clear or pale yellow.  Take over-the-counter and prescription medicines only as told by your doctor.  Do not drive or use heavy machinery while taking prescription pain medicine.  Follow instructions from your doctor about what you can or cannot eat and drink.  Return to your normal activities as told by your doctor. Ask your doctor what activities are safe for you. Avoiding Future Gout Attacks  Follow a low-purine diet as told by a specialist (dietitian) or your doctor. Avoid foods and drinks that have a lot of purines,  such as: ? Liver. ? Kidney. ? Anchovies. ? Asparagus. ? Herring. ? Mushrooms ? Mussels. ? Beer.  Limit alcohol intake to no more than 1 drink a day for nonpregnant women and 2 drinks a day for men. One drink equals 12 oz of beer, 5 oz of wine, or 1 oz of hard liquor.  Stay at a healthy weight or lose weight if you are overweight. If you want to lose weight, talk with your doctor. It is important that you do not lose weight too fast.  Start or continue an exercise plan as told by your doctor.  Drink enough fluids to keep your pee clear or pale yellow.  Take over-the-counter and prescription medicines only as told by your doctor.  Keep all follow-up visits as told by your doctor. This is important. Contact a doctor if:  You have another gout attack.  You still have symptoms of a gout attack after10 days of treatment.  You have problems (side effects) because of your medicines.  You have chills or a fever.  You have burning pain when you pee (urinate).  You  have pain in your lower back or belly. Get help right away if:  You have very bad pain.  Your pain cannot be controlled.  You cannot pee. This information is not intended to replace advice given to you by your health care provider. Make sure you discuss any questions you have with your health care provider. Document Released: 08/25/2008 Document Revised: 04/23/2016 Document Reviewed: 08/29/2015 Elsevier Interactive Patient Education  Ozan Schein.

## 2018-06-14 LAB — COMPLETE METABOLIC PANEL WITH GFR
AG Ratio: 1.1 (calc) (ref 1.0–2.5)
ALT: 83 U/L — ABNORMAL HIGH (ref 9–46)
AST: 122 U/L — ABNORMAL HIGH (ref 10–35)
Albumin: 4 g/dL (ref 3.6–5.1)
Alkaline phosphatase (APISO): 115 U/L (ref 40–115)
BUN: 15 mg/dL (ref 7–25)
CO2: 29 mmol/L (ref 20–32)
Calcium: 9.4 mg/dL (ref 8.6–10.3)
Chloride: 104 mmol/L (ref 98–110)
Creat: 1.15 mg/dL (ref 0.70–1.33)
GFR, Est African American: 80 mL/min/{1.73_m2} (ref >=60)
GFR, Est Non African American: 69 mL/min/{1.73_m2} (ref >=60)
Globulin: 3.7 g/dL (calc) (ref 1.9–3.7)
Glucose, Bld: 123 mg/dL — ABNORMAL HIGH (ref 65–99)
Potassium: 5.4 mmol/L — ABNORMAL HIGH (ref 3.5–5.3)
Sodium: 139 mmol/L (ref 135–146)
Total Bilirubin: 0.9 mg/dL (ref 0.2–1.2)
Total Protein: 7.7 g/dL (ref 6.1–8.1)

## 2018-06-14 LAB — LIPID PANEL
Cholesterol: 106 mg/dL (ref <200)
HDL: 59 mg/dL (ref >40)
LDL Cholesterol (Calc): 29 mg/dL (calc)
Non-HDL Cholesterol (Calc): 47 mg/dL (calc) (ref <130)
Total CHOL/HDL Ratio: 1.8 (calc) (ref <5.0)
Triglycerides: 101 mg/dL (ref <150)

## 2018-06-14 LAB — HEMOGLOBIN A1C
Hgb A1c MFr Bld: 4.9 % of total Hgb (ref <5.7)
Mean Plasma Glucose: 94 (calc)
eAG (mmol/L): 5.2 (calc)

## 2018-06-14 LAB — VITAMIN B12: Vitamin B-12: 558 pg/mL (ref 200–1100)

## 2018-06-14 LAB — IRON,TIBC AND FERRITIN PANEL
%SAT: 52 % (calc) — ABNORMAL HIGH (ref 20–48)
Ferritin: 254 ng/mL (ref 38–380)
Iron: 250 ug/dL — ABNORMAL HIGH (ref 50–180)
TIBC: 480 mcg/dL (calc) — ABNORMAL HIGH (ref 250–425)

## 2018-06-14 LAB — TSH: TSH: 1.32 mIU/L (ref 0.40–4.50)

## 2018-06-14 LAB — FOLATE: Folate: 8.2 ng/mL

## 2018-06-15 ENCOUNTER — Encounter: Payer: Self-pay | Admitting: Gastroenterology

## 2018-06-21 ENCOUNTER — Encounter: Payer: Self-pay | Admitting: Nurse Practitioner

## 2018-06-29 ENCOUNTER — Ambulatory Visit: Payer: Medicaid Other

## 2018-06-29 ENCOUNTER — Telehealth: Payer: Self-pay | Admitting: Nurse Practitioner

## 2018-06-29 NOTE — Telephone Encounter (Signed)
Please call patient to ask if he knew about the appointment.   - If not, reschedule and confirm next appointment.   - If so, please ask what his barrier to keeping appointment was?  Does he still want the test to be done? - If so, please reschedule.

## 2018-06-29 NOTE — Telephone Encounter (Signed)
[place]  Called said that pt did not come to his appt. today

## 2018-06-29 NOTE — Telephone Encounter (Signed)
I spoke w/ the pt to f/u with him on why he missed his Ultrasound appt. Pt states he forgot about the appointment. I gave him scheduling phone number for him to call and reschedule his appointment.

## 2018-07-04 ENCOUNTER — Ambulatory Visit: Payer: Medicaid Other

## 2018-07-07 ENCOUNTER — Ambulatory Visit
Admission: RE | Admit: 2018-07-07 | Discharge: 2018-07-07 | Disposition: A | Discharge status: Home / Self Care | Payer: Medicaid Other | Source: Ambulatory Visit | Attending: Nurse Practitioner | Admitting: Nurse Practitioner

## 2018-07-07 DIAGNOSIS — R7989 Other specified abnormal findings of blood chemistry: Secondary | ICD-10-CM | POA: Diagnosis present

## 2018-07-07 DIAGNOSIS — R160 Hepatomegaly, not elsewhere classified: Secondary | ICD-10-CM | POA: Insufficient documentation

## 2018-07-07 DIAGNOSIS — D7589 Other specified diseases of blood and blood-forming organs: Secondary | ICD-10-CM | POA: Diagnosis not present

## 2018-07-07 DIAGNOSIS — R945 Abnormal results of liver function studies: Secondary | ICD-10-CM

## 2018-07-07 NOTE — Addendum Note (Signed)
Addended by: Cleaster Corin on: 07/07/2018 06:01 PM   Modules accepted: Orders

## 2018-07-12 ENCOUNTER — Telehealth: Payer: Self-pay | Admitting: Nurse Practitioner

## 2018-07-12 DIAGNOSIS — T1590XS Foreign body on external eye, part unspecified, unspecified eye, sequela: Secondary | ICD-10-CM

## 2018-07-12 NOTE — Telephone Encounter (Signed)
Pt needs a xray order for orbits put in system due to previous work where he got shavings in eyes before he can have MRI on Friday.

## 2018-07-12 NOTE — Telephone Encounter (Signed)
Order placed

## 2018-07-15 ENCOUNTER — Ambulatory Visit
Admission: RE | Admit: 2018-07-15 | Discharge: 2018-07-15 | Disposition: A | Discharge status: Home / Self Care | Payer: Disability Insurance | Source: Ambulatory Visit | Attending: Family Medicine | Admitting: Family Medicine

## 2018-07-15 ENCOUNTER — Ambulatory Visit
Admission: RE | Admit: 2018-07-15 | Discharge: 2018-07-15 | Disposition: A | Discharge status: Home / Self Care | Payer: Disability Insurance | Source: Ambulatory Visit | Attending: Nurse Practitioner | Admitting: Nurse Practitioner

## 2018-07-15 ENCOUNTER — Other Ambulatory Visit: Payer: Self-pay | Admitting: Family Medicine

## 2018-07-15 DIAGNOSIS — M545 Low back pain: Secondary | ICD-10-CM

## 2018-07-15 DIAGNOSIS — M25511 Pain in right shoulder: Secondary | ICD-10-CM

## 2018-07-15 DIAGNOSIS — K769 Liver disease, unspecified: Secondary | ICD-10-CM | POA: Insufficient documentation

## 2018-07-15 DIAGNOSIS — R160 Hepatomegaly, not elsewhere classified: Secondary | ICD-10-CM | POA: Diagnosis present

## 2018-07-15 DIAGNOSIS — M25512 Pain in left shoulder: Secondary | ICD-10-CM | POA: Diagnosis not present

## 2018-07-15 DIAGNOSIS — T1590XS Foreign body on external eye, part unspecified, unspecified eye, sequela: Secondary | ICD-10-CM

## 2018-07-15 DIAGNOSIS — M4316 Spondylolisthesis, lumbar region: Secondary | ICD-10-CM | POA: Diagnosis not present

## 2018-07-15 DIAGNOSIS — Z0100 Encounter for examination of eyes and vision without abnormal findings: Secondary | ICD-10-CM | POA: Diagnosis not present

## 2018-07-15 MED ORDER — GADOBENATE DIMEGLUMINE 529 MG/ML IV SOLN
15.0000 mL | Freq: Once | INTRAVENOUS | Status: AC | PRN
  Administered 2018-07-15: 15 mL via INTRAVENOUS

## 2018-07-18 ENCOUNTER — Ambulatory Visit: Payer: Medicaid Other | Admitting: Nurse Practitioner

## 2018-07-18 ENCOUNTER — Other Ambulatory Visit: Payer: Self-pay | Admitting: Nurse Practitioner

## 2018-07-18 DIAGNOSIS — R160 Hepatomegaly, not elsewhere classified: Secondary | ICD-10-CM

## 2018-07-18 DIAGNOSIS — R7989 Other specified abnormal findings of blood chemistry: Secondary | ICD-10-CM

## 2018-07-18 DIAGNOSIS — R945 Abnormal results of liver function studies: Principal | ICD-10-CM

## 2018-07-18 NOTE — Addendum Note (Signed)
Addended by: Cassell Smiles R on: 07/18/2018 10:40 AM   Modules accepted: Orders

## 2018-07-21 ENCOUNTER — Ambulatory Visit: Payer: Medicaid Other | Admitting: Nurse Practitioner

## 2018-07-29 ENCOUNTER — Other Ambulatory Visit: Payer: Self-pay

## 2018-07-29 ENCOUNTER — Inpatient Hospital Stay: Payer: Medicaid Other

## 2018-07-29 ENCOUNTER — Encounter: Payer: Self-pay | Admitting: Hematology and Oncology

## 2018-07-29 ENCOUNTER — Other Ambulatory Visit: Payer: Self-pay | Admitting: Hematology and Oncology

## 2018-07-29 ENCOUNTER — Inpatient Hospital Stay: Payer: Medicaid Other | Attending: Hematology and Oncology | Admitting: Hematology and Oncology

## 2018-07-29 VITALS — BP 128/83 | HR 96 | Temp 98.3°F | Ht 66.0 in | Wt 164.8 lb

## 2018-07-29 DIAGNOSIS — R790 Abnormal level of blood mineral: Secondary | ICD-10-CM

## 2018-07-29 DIAGNOSIS — R52 Pain, unspecified: Secondary | ICD-10-CM | POA: Diagnosis not present

## 2018-07-29 DIAGNOSIS — R160 Hepatomegaly, not elsewhere classified: Secondary | ICD-10-CM

## 2018-07-29 DIAGNOSIS — K769 Liver disease, unspecified: Secondary | ICD-10-CM

## 2018-07-29 DIAGNOSIS — F17210 Nicotine dependence, cigarettes, uncomplicated: Secondary | ICD-10-CM | POA: Diagnosis not present

## 2018-07-29 DIAGNOSIS — C220 Liver cell carcinoma: Secondary | ICD-10-CM | POA: Insufficient documentation

## 2018-07-29 LAB — COMPREHENSIVE METABOLIC PANEL
ALT: 75 U/L — ABNORMAL HIGH (ref 0–44)
AST: 89 U/L — ABNORMAL HIGH (ref 15–41)
Albumin: 3.6 g/dL (ref 3.5–5.0)
Alkaline Phosphatase: 102 U/L (ref 38–126)
Anion gap: 7 (ref 5–15)
BUN: 16 mg/dL (ref 6–20)
CO2: 26 mmol/L (ref 22–32)
Calcium: 9 mg/dL (ref 8.9–10.3)
Chloride: 105 mmol/L (ref 98–111)
Creatinine, Ser: 1.15 mg/dL (ref 0.61–1.24)
GFR calc Af Amer: >60 mL/min (ref >60)
GFR calc non Af Amer: >60 mL/min (ref >60)
Glucose, Bld: 109 mg/dL — ABNORMAL HIGH (ref 70–99)
Potassium: 4.9 mmol/L (ref 3.5–5.1)
Sodium: 138 mmol/L (ref 135–145)
Total Bilirubin: 0.5 mg/dL (ref 0.3–1.2)
Total Protein: 7.6 g/dL (ref 6.5–8.1)

## 2018-07-29 LAB — PROTIME-INR
INR: 0.97
Prothrombin Time: 12.8 seconds (ref 11.4–15.2)

## 2018-07-29 LAB — CBC WITH DIFFERENTIAL/PLATELET
Basophils Absolute: 0.1 10*3/uL (ref 0–0.1)
Basophils Relative: 2 %
Eosinophils Absolute: 0.3 10*3/uL (ref 0–0.7)
Eosinophils Relative: 5 %
HCT: 38.2 % — ABNORMAL LOW (ref 40.0–52.0)
Hemoglobin: 12.8 g/dL — ABNORMAL LOW (ref 13.0–18.0)
Lymphocytes Relative: 41 %
Lymphs Abs: 2.2 10*3/uL (ref 1.0–3.6)
MCH: 33.2 pg (ref 26.0–34.0)
MCHC: 33.5 g/dL (ref 32.0–36.0)
MCV: 99.2 fL (ref 80.0–100.0)
Monocytes Absolute: 0.6 10*3/uL (ref 0.2–1.0)
Monocytes Relative: 10 %
Neutro Abs: 2.3 10*3/uL (ref 1.4–6.5)
Neutrophils Relative %: 42 %
Platelets: 128 10*3/uL — ABNORMAL LOW (ref 150–440)
RBC: 3.85 MIL/uL — ABNORMAL LOW (ref 4.40–5.90)
RDW: 12.4 % (ref 11.5–14.5)
WBC: 5.5 10*3/uL (ref 3.8–10.6)

## 2018-07-29 NOTE — Progress Notes (Signed)
Met with Vernon [name], and his daughter, before, during, and after consult with Dr. Mike Gip. Introduced Therapist, nutritional and provided contact information for future needs. PET and follow up arranged. Went over instructions for PET scan. All questions answered. Oncology Nurse Navigator Documentation  Navigator Location: [facility] (07/29/18 1500)   )Navigator Encounter Type: Initial MedOnc (07/29/18 1500)                     Patient Visit Type: MedOnc;Initial (07/29/18 1500)   Barriers/Navigation Needs: Coordination of Care;Education (07/29/18 1500)                Acuity: Level 2 (07/29/18 1500)   Acuity Level 2: Initial guidance, education and coordination as needed;Ongoing guidance and education throughout treatment as needed (07/29/18 1500)     Time Spent with Patient: 45 (07/29/18 1500)

## 2018-07-29 NOTE — Progress Notes (Signed)
Patient here for initial evaluation.  °

## 2018-07-29 NOTE — Progress Notes (Signed)
[facility] day:  07/29/2018  Chief Complaint: Vernon [name] is a 57 y.o. male with liver masses who is referred by Cassell Smiles, NP in consultation for assessment and management.  HPI:  He was seen in clinic on 06/13/2018 to establish care.  He complained of bilateral leg pain and cramping and dizziness with sudden movement x 6 months.  Labs on 05/30/2018 revealed a hematocrit of 40.5, hemoglobin 13.7, MCV 101.7, platelets 105,000, WBC 4700 with an ANC of 2500.  Labs on 06/13/2018 revealed a creatinine 1.15, AST 122, ALT 83, bilirubin 0.9.  Folate was 8.2.  B12 was 558.  Ferritin was 254, iron saturation was 52%, and TIBC 480.  TSH was 1.32.  Right upper quadrant ultrasound on 07/07/2018 revealed abnormal hypoechoic masses of varying sizes within the liver worrisome for malignancy.  Near the dome of the liver was a 4.6 x 4.4 x 4.5 cm mass and a 1.3 x 1.1 x 1.3 cm mass near the porta hepatitis.  Liver MRI on 07/15/2018 revealed multiple hypervascular liver lesions highly suspicious for malignancy.  There was a 5.5 x 4.5 cm lesion in segment 8 and multiple other smaller lesions (up to 2 cm) in both the right and left hepatic lobe.  There was no gross morphologic changes of cirrhosis.  Differential diagnosis included multifocal hepatocellular carcinoma and metastatic disease.  Ultrasound guided percutaneous biopsy was recommended.  Plain films on the pelvis, lumbar, and cervical spine on 07/15/2018 revealed degenerative disease.  He denies any history of hepatitis.  He has never received a blood transfusion.  He smokes 1-2 cigarettes/day. He previously smoked 1 pack.day x 10 years.  He drinks 1-2 beers/night (40 oz).  His parents died of cancer (unknown type).   Past Medical History:  Diagnosis Date  . Gout     History reviewed. No pertinent surgical history.  Family History  Problem Relation Age of Onset  . Cancer Mother   . Cancer Father    . Hypertension Maternal Uncle   . Healthy Daughter   . Healthy Son     Social History:  reports that he has been smoking cigarettes. He has smoked for the past 18.00 years. He has never used smokeless tobacco. He reports that he drank alcohol. He reports that he does not use drugs.  He smokes 1-2 cigarettes/day. He previously smoked 1 pack/day x 10 years.  He drinks 1-2 beers/night (40 oz).  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  He is trying to get disability.  He lives with hsi girlfriend.  The patient is accompanied by his daughter, Delana Meyer, today.  Allergies: No Known Allergies  Possible tape allergy.  Current Medications: Current Outpatient Medications  Medication Sig Dispense Refill  . allopurinol (ZYLOPRIM) 100 MG tablet Take 1 tablet (100 mg total) by mouth daily. 30 tablet 6  . colchicine 0.6 MG tablet Take 1 tablet (0.6 mg total) by mouth daily. (Patient not taking: Reported on 07/29/2018) 30 tablet 0  . cyclobenzaprine (FLEXERIL) 10 MG tablet Take 1 tablet (10 mg total) by mouth 3 (three) times daily as needed. (Patient not taking: Reported on 07/29/2018) 15 tablet 0   No current facility-administered medications for this visit.     Review of Systems:  GENERAL:  Feels "ok".  No fevers, sweats or weight loss. PERFORMANCE STATUS (ECOG):  1 HEENT:  No visual changes, runny nose, sore throat, mouth sores or tenderness. Lungs: No shortness  of breath or cough.  No hemoptysis. Cardiac:  No chest pain, palpitations, orthopnea, or PND. GI:  No nausea, vomiting, diarrhea, constipation, melena or hematochezia.  No prior colonoscopy. GU:  No urgency, frequency, dysuria, or hematuria. Musculoskeletal:  Back, knee and shoulder pain secondary to job.  No muscle tenderness. Extremities:  No pain or swelling. Skin:  No rashes or skin changes. Neuro:  No headache, numbness or weakness, balance or coordination issues. Endocrine:  No diabetes, thyroid issues, hot flashes  or night sweats. Psych:  No mood changes, depression or anxiety. Pain:  No focal pain. Review of systems:  All other systems reviewed and found to be negative.  Physical Exam: Blood pressure 128/83, pulse 96, temperature 98.3 F (36.8 C), temperature source Oral, height 5\' 6"  (1.676 m), weight 164 lb 12.8 oz (74.8 kg). GENERAL:  Well developed, well nourished,gentleman sitting comfortably in the exam room in no acute distress. MENTAL STATUS:  Alert and oriented to person, place and time. HEAD:  Wearing a cap  Dark hair.  Slight goatee.  Normocephalic, atraumatic, face symmetric, no Cushingoid features. EYES:  Brown eyes.  Pupils equal round and reactive to light and accomodation.  No conjunctivitis.  Muddy sclera. ENT:  Oropharynx clear without lesion.  Tongue normal.  Edentulous.  Mucous membranes moist.  RESPIRATORY:  Clear to auscultation without rales, wheezes or rhonchi. CARDIOVASCULAR:  Regular rate and rhythm without murmur, rub or gallop. ABDOMEN:  Soft, non-tender, with active bowel sounds, and no splenomegaly.  Liver palpable 3 FB below RCM.  No masses. SKIN:  Nail trauma.  No rashes, ulcers or lesions. EXTREMITIES: No edema, no skin discoloration or tenderness.  No palpable cords. LYMPH NODES: Right neck fullness/adenopathy.  No palpable supraclavicular, axillary or inguinal adenopathy  NEUROLOGICAL: Unremarkable. PSYCH:  Appropriate.   No visits with results within 3 Day(s) from this visit.  Latest known visit with results is:  Office Visit on 06/13/2018  Component Date Value Ref Range Status  . Glucose, Bld 06/13/2018 123* 65 - 99 mg/dL Final   Comment: .            Fasting reference interval . For someone without known diabetes, a glucose value between 100 and 125 mg/dL is consistent with prediabetes and should be confirmed with a follow-up test. .   . BUN 06/13/2018 15  7 - 25 mg/dL Final  . Creat 06/13/2018 1.15  0.70 - 1.33 mg/dL Final   Comment: For patients  >54 years of age, the reference limit for Creatinine is approximately 13% higher for people identified as African-American. .   . GFR, Est Non African American 06/13/2018 69  > OR = 60 mL/min/1.57m2 Final  . GFR, Est African American 06/13/2018 80  > OR = 60 mL/min/1.55m2 Final  . BUN/Creatinine Ratio 32/99/2426 NOT APPLICABLE  6 - 22 (calc) Final  . Sodium 06/13/2018 139  135 - 146 mmol/L Final  . Potassium 06/13/2018 5.4* 3.5 - 5.3 mmol/L Final  . Chloride 06/13/2018 104  98 - 110 mmol/L Final  . CO2 06/13/2018 29  20 - 32 mmol/L Final  . Calcium 06/13/2018 9.4  8.6 - 10.3 mg/dL Final  . Total Protein 06/13/2018 7.7  6.1 - 8.1 g/dL Final  . Albumin 06/13/2018 4.0  3.6 - 5.1 g/dL Final  . Globulin 06/13/2018 3.7  1.9 - 3.7 g/dL (calc) Final  . AG Ratio 06/13/2018 1.1  1.0 - 2.5 (calc) Final  . Total Bilirubin 06/13/2018 0.9  0.2 - 1.2 mg/dL Final  .  Alkaline phosphatase (APISO) 06/13/2018 115  40 - 115 U/L Final  . AST 06/13/2018 122* 10 - 35 U/L Final  . ALT 06/13/2018 83* 9 - 46 U/L Final  . Vitamin B-12 06/13/2018 558  200 - 1,100 pg/mL Final  . Folate 06/13/2018 8.2  ng/mL Final   Comment:                            Reference Range                            Low:           <3.4                            Borderline:    3.4-5.4                            Normal:        >5.4 .   . Hgb A1c MFr Bld 06/13/2018 4.9  <5.7 % of total Hgb Final   Comment: For the purpose of screening for the presence of diabetes: . <5.7%       Consistent with the absence of diabetes 5.7-6.4%    Consistent with increased risk for diabetes             (prediabetes) > or =6.5%  Consistent with diabetes . This assay result is consistent with a decreased risk of diabetes. . Currently, no consensus exists regarding use of hemoglobin A1c for diagnosis of diabetes in children. . According to American Diabetes Association (ADA) guidelines, hemoglobin A1c <7.0% represents optimal control in  non-pregnant diabetic patients. Different metrics may apply to specific patient populations.  Standards of Medical Care in Diabetes(ADA). .   . Mean Plasma Glucose 06/13/2018 94  (calc) Final  . eAG (mmol/L) 06/13/2018 5.2  (calc) Final  . TSH 06/13/2018 1.32  0.40 - 4.50 mIU/L Final  . Iron 06/13/2018 250* 50 - 180 mcg/dL Final  . TIBC 06/13/2018 480* 250 - 425 mcg/dL (calc) Final  . %SAT 06/13/2018 52* 20 - 48 % (calc) Final  . Ferritin 06/13/2018 254  38 - 380 ng/mL Final  . Cholesterol 06/13/2018 106  <200 mg/dL Final  . HDL 06/13/2018 59  >40 mg/dL Final  . Triglycerides 06/13/2018 101  <150 mg/dL Final  . LDL Cholesterol (Calc) 06/13/2018 29  mg/dL (calc) Final   Comment: Reference range: <100 . Desirable range <100 mg/dL for primary prevention;   <70 mg/dL for patients with CHD or diabetic patients  with > or = 2 CHD risk factors. Marland Kitchen LDL-C is now calculated using the [name]-Hopkins  calculation, which is a validated novel method providing  better accuracy than the Friedewald equation in the  estimation of LDL-C.  Cresenciano Genre et al. Annamaria Helling. 5638;756(43): 2061-2068  (http://education.[employer].com/faq/FAQ164)   . Total CHOL/HDL Ratio 06/13/2018 1.8  <5.0 (calc) Final  . Non-HDL Cholesterol (Calc) 06/13/2018 47  <130 mg/dL (calc) Final   Comment: For patients with diabetes plus 1 major ASCVD risk  factor, treating to a non-HDL-C goal of <100 mg/dL  (LDL-C of <70 mg/dL) is considered a therapeutic  option.     Assessment:  COLVIN BLATT is a 58 y.o. male with multiple liver lesions worrisome for hepatocellular carcinoma or metastatic disease.  Liver MRI on 07/15/2018  revealed multiple hypervascular liver lesions highly suspicious for malignancy.  There was a 5.5 x 4.5 cm lesion in segment 8 and multiple other smaller lesions (up to 2 cm) in both the right and left hepatic lobe.  There was no gross morphologic changes of cirrhosis.  Differential diagnosis included  multifocal hepatocellalur carcinoma and metastatic disease.  Ultrasound guided percutaneous biopsy was recommended.  He has never had a colonoscopy. Ferritin was 254 and iron saturation was 52% on 05/30/2018.  He has a > 10 pack year smoking history.  Symptomatically, he has occupational related pain (tiler).  Exam reveals hepatomegaly and right neck fullness.  Plan: 1.   Review imaging with patient and daughter.  Images personally reviewed.  Etiology of liver lesions unclear, but may be due to multi-focal hepatocellular carcinoma or metastatic disease.  Exam reveals right neck fullness.  Discuss obtaining a PET scan to assess for disease outside of liver and best site to biopsy.  Unclear significance of elevated iron saturation.  Discuss checking hemochromatosis assay (increased risk of [place]).  Multiple questions asked and answered. 2.   Labs today:  CBC with diff, CMP, hepatitis B surface antigen, hepatitis B core antibody, hepatitis C antibody, AFP, hemochromatosis assay, CEA. 3.   Schedule PET scan. 4.   RTC after PET scan for MD assessment, review of work-up nd discussion regarding direction of therapy.   Lequita Asal, MD  07/29/2018, 2:42 PM

## 2018-07-30 LAB — HEPATITIS C ANTIBODY: HCV Ab: >11 s/co ratio — ABNORMAL HIGH (ref 0.0–0.9)

## 2018-07-30 LAB — CEA: CEA: 7.5 ng/mL — ABNORMAL HIGH (ref 0.0–4.7)

## 2018-07-30 LAB — HEPATITIS B CORE ANTIBODY, TOTAL: Hep B Core Total Ab: NEGATIVE

## 2018-07-30 LAB — AFP TUMOR MARKER: AFP, Serum, Tumor Marker: 21.6 ng/mL — ABNORMAL HIGH (ref 0.0–8.3)

## 2018-08-03 LAB — HEMOCHROMATOSIS DNA-PCR(C282Y,H63D)

## 2018-08-04 ENCOUNTER — Ambulatory Visit: Payer: Medicaid Other

## 2018-08-05 ENCOUNTER — Ambulatory Visit: Payer: Disability Insurance | Admitting: Hematology and Oncology

## 2018-08-11 ENCOUNTER — Encounter
Admission: RE | Admit: 2018-08-11 | Discharge: 2018-08-11 | Disposition: A | Discharge status: Home / Self Care | Payer: Medicaid Other | Source: Ambulatory Visit | Attending: Hematology and Oncology | Admitting: Hematology and Oncology

## 2018-08-11 DIAGNOSIS — K769 Liver disease, unspecified: Secondary | ICD-10-CM | POA: Diagnosis present

## 2018-08-11 LAB — GLUCOSE, CAPILLARY: Glucose-Capillary: 119 mg/dL — ABNORMAL HIGH (ref 70–99)

## 2018-08-11 MED ORDER — FLUDEOXYGLUCOSE F - 18 (FDG) INJECTION
8.6400 | Freq: Once | INTRAVENOUS | Status: AC | PRN
  Administered 2018-08-11: 8.64 via INTRAVENOUS

## 2018-08-12 ENCOUNTER — Other Ambulatory Visit: Payer: Self-pay | Admitting: Hematology and Oncology

## 2018-08-12 ENCOUNTER — Telehealth: Payer: Self-pay

## 2018-08-12 ENCOUNTER — Inpatient Hospital Stay: Payer: Disability Insurance | Attending: Hematology and Oncology | Admitting: Hematology and Oncology

## 2018-08-12 DIAGNOSIS — B1920 Unspecified viral hepatitis C without hepatic coma: Secondary | ICD-10-CM

## 2018-08-12 DIAGNOSIS — R160 Hepatomegaly, not elsewhere classified: Secondary | ICD-10-CM

## 2018-08-12 NOTE — Progress Notes (Deleted)
[facility] day:  08/12/2018  Chief Complaint: Vernon [name] is a 57 y.o. male with liver masses who is referred by Cassell Smiles, NP in consultation for assessment and management.  HPI:  The patient was last seen in the medical oncology clinic on 07/29/2018 for new patient assessment.   He had multiple liver lesions worrisome for hepatocellular carcinoma or metastatic disease.   Exam reveals hepatomegaly and right neck fullness.  Laboratory work-up was positive for hepatitis C.  AFP was 21.6 (0-8.3).  PT was 12.8 (INR 0.97).  CEA was 7.5.  Hematocrit was 38.2, hemoglobin 12.8, MCV 99.2, and platelets 128,000.  AST was 89 and ALT 75.  Negative studies included:  hepatitis B core antibody and hemochromatosis assay.  PET scan on 08/11/2018 revealed no hypermetabolic masses identified within the liver, or elsewhere within the neck, chest, abdomen, or pelvis. This makes hepatic metastases extremely unlikely, however multifocal hepatocellular carcinoma cannot be excluded.  Symptomatically,   Past Medical History:  Diagnosis Date  . Gout     No past surgical history on file.  Family History  Problem Relation Age of Onset  . Cancer Mother   . Cancer Father   . Hypertension Maternal Uncle   . Healthy Daughter   . Healthy Son     Social History:  reports that he has been smoking cigarettes. He has smoked for the past 18.00 years. He has never used smokeless tobacco. He reports that he drank alcohol. He reports that he does not use drugs.  He smokes 1-2 cigarettes/day. He previously smoked 1 pack/day x 10 years.  He drinks 1-2 beers/night (40 oz).  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  He is trying to get disability.  He lives with hsi girlfriend.  The patient is accompanied by his daughter, Vernon [name], today.  Allergies: No Known Allergies  Possible tape allergy.  Current Medications: Current Outpatient Medications   Medication Sig Dispense Refill  . allopurinol (ZYLOPRIM) 100 MG tablet Take 1 tablet (100 mg total) by mouth daily. 30 tablet 6  . colchicine 0.6 MG tablet Take 1 tablet (0.6 mg total) by mouth daily. (Patient not taking: Reported on 07/29/2018) 30 tablet 0  . cyclobenzaprine (FLEXERIL) 10 MG tablet Take 1 tablet (10 mg total) by mouth 3 (three) times daily as needed. (Patient not taking: Reported on 07/29/2018) 15 tablet 0   No current facility-administered medications for this visit.     Review of Systems:  GENERAL:  Feels "ok".  No fevers, sweats or weight loss. PERFORMANCE STATUS (ECOG):  1 HEENT:  No visual changes, runny nose, sore throat, mouth sores or tenderness. Lungs: No shortness of breath or cough.  No hemoptysis. Cardiac:  No chest pain, palpitations, orthopnea, or PND. GI:  No nausea, vomiting, diarrhea, constipation, melena or hematochezia.  No prior colonoscopy. GU:  No urgency, frequency, dysuria, or hematuria. Musculoskeletal:  Back, knee and shoulder pain secondary to job.  No muscle tenderness. Extremities:  No pain or swelling. Skin:  No rashes or skin changes. Neuro:  No headache, numbness or weakness, balance or coordination issues. Endocrine:  No diabetes, thyroid issues, hot flashes or night sweats. Psych:  No mood changes, depression or anxiety. Pain:  No focal pain. Review of systems:  All other systems reviewed and found to be negative.  Physical Exam: There were no vitals taken for this visit. GENERAL:  Well developed, well nourished,gentleman sitting comfortably in  the exam room in no acute distress. MENTAL STATUS:  Alert and oriented to person, place and time. HEAD:  Wearing a cap  Dark hair.  Slight goatee.  Normocephalic, atraumatic, face symmetric, no Cushingoid features. EYES:  Brown eyes.  Pupils equal round and reactive to light and accomodation.  No conjunctivitis.  Muddy sclera. ENT:  Oropharynx clear without lesion.  Tongue normal.  Edentulous.   Mucous membranes moist.  RESPIRATORY:  Clear to auscultation without rales, wheezes or rhonchi. CARDIOVASCULAR:  Regular rate and rhythm without murmur, rub or gallop. ABDOMEN:  Soft, non-tender, with active bowel sounds, and no splenomegaly.  Liver palpable 3 FB below RCM.  No masses. SKIN:  Nail trauma.  No rashes, ulcers or lesions. EXTREMITIES: No edema, no skin discoloration or tenderness.  No palpable cords. LYMPH NODES: Right neck fullness/adenopathy.  No palpable supraclavicular, axillary or inguinal adenopathy  NEUROLOGICAL: Unremarkable. PSYCH:  Appropriate.   Hospital Outpatient Visit on 08/11/2018  Component Date Value Ref Range Status  . Glucose-Capillary 08/11/2018 119* 70 - 99 mg/dL Final    Assessment:  Vernon [name] is a 57 y.o. male with multiple liver lesions worrisome for hepatocellular carcinoma or metastatic disease.  Liver MRI on 07/15/2018 revealed multiple hypervascular liver lesions highly suspicious for malignancy.  There was a 5.5 x 4.5 cm lesion in segment 8 and multiple other smaller lesions (up to 2 cm) in both the right and left hepatic lobe.  There was no gross morphologic changes of cirrhosis.  Differential diagnosis included multifocal hepatocellalur carcinoma and metastatic disease.  Ultrasound guided percutaneous biopsy was recommended.  PET scan on 08/11/2018 revealed no hypermetabolic masses identified within the liver, or elsewhere within the neck, chest, abdomen, or pelvis. This makes hepatic metastases extremely unlikely, however multifocal hepatocellular carcinoma cannot be excluded.  Laboratory work-up on 07/29/2018 revealed hepatitis C.  AFP was 21.6 (0-8.3).  CEA was 7.5.  Negative studies included:  hepatitis B core antibody and hemochromatosis assay.  He has never had a colonoscopy. Ferritin was 254 and iron saturation was 52% on 05/30/2018.  He has a > 10 pack year smoking history.  Symptomatically,  he has occupational related pain  (tiler).  Exam reveals hepatomegaly and right neck fullness.  Plan: 1.   Review laboratory work-up.  Elevated AFP and CEA.  Hepatitis C +. 2.   Review PET scan.  No evidence of metastatic disease.  Suspect multi-focal hepatocellular carcinoma. 3.   Liver lesions:  Etiology suspicious for hepatocellular carcinoma.  Discuss ultrasound guided liver biopsy. 4.  Hepatitis C:  Check hepatitis C genotype and quantitative assay. 5.  Schedule ultrasound guided liver biopsy. 6.  RTC 3 days after biopsy for MD assessment, review of pathology, and discussion regarding direction of therapy.    Lequita Asal, MD  08/12/2018, 3:35 AM   I saw and evaluated the patient, participating in the key portions of the service and reviewing pertinent diagnostic studies and records.  I reviewed the nurse practitioner's note and agree with the findings and the plan.  The assessment and plan were discussed with the patient.  Additional diagnostic studies of *** are needed to clarify *** and would change the clinical management.  A few ***multiple questions were asked by the patient and answered.   Nolon Stalls, MD 08/12/2018,3:35 AM

## 2018-08-12 NOTE — Telephone Encounter (Signed)
Voicemail left with Mr. Godbolt to return call. Would like to discuss PET results and discuss U/S liver biopsy recommended by Dr. Mike Gip. Oncology Nurse Navigator Documentation  Navigator Location: [facility] (08/12/18 1400)   )Navigator Encounter Type: Telephone (08/12/18 1400) Telephone: [place] Call (08/12/18 1400)                                                  Time Spent with Patient: 15 (08/12/18 1400)

## 2018-08-15 ENCOUNTER — Telehealth: Payer: Self-pay

## 2018-08-15 ENCOUNTER — Telehealth: Payer: Self-pay | Admitting: *Deleted

## 2018-08-15 ENCOUNTER — Ambulatory Visit: Payer: Medicaid Other | Admitting: Nurse Practitioner

## 2018-08-15 NOTE — Telephone Encounter (Signed)
Called patient to inform him that his liver biopsy has been scheduled for Thursday, September 26th.  Patient instructed to arrive at the medical mall @ 7:30 AM for 8:30 AM procedure.  Patient verbalized understanding.

## 2018-08-15 NOTE — Telephone Encounter (Signed)
Called and discussed PET results with Mr. Vernon [name]. Educated that Dr. Mike Gip would like to proceed with liver biopsy as previously discussed. Educated further on liver biopsy. He is agreeable to biopsy. Orders have been placed and we will call him with the details once scheduled. Oncology Nurse Navigator Documentation  Navigator Location: [facility] (08/15/18 0900)   )Navigator Encounter Type: Telephone (08/15/18 0900) Telephone: Outgoing Call;Diagnostic Results;Education (08/15/18 0900)                                                  Time Spent with Patient: 15 (08/15/18 0900)

## 2018-08-22 ENCOUNTER — Inpatient Hospital Stay: Payer: Disability Insurance | Admitting: Hematology and Oncology

## 2018-08-23 ENCOUNTER — Encounter

## 2018-08-23 ENCOUNTER — Encounter: Payer: Self-pay | Admitting: Gastroenterology

## 2018-08-23 ENCOUNTER — Ambulatory Visit: Payer: Medicaid Other | Admitting: Gastroenterology

## 2018-08-23 DIAGNOSIS — B1710 Acute hepatitis C without hepatic coma: Secondary | ICD-10-CM

## 2018-08-24 ENCOUNTER — Other Ambulatory Visit: Payer: Self-pay | Admitting: Radiology

## 2018-08-25 ENCOUNTER — Ambulatory Visit
Admission: RE | Admit: 2018-08-25 | Discharge: 2018-08-25 | Disposition: A | Discharge status: Home / Self Care | Payer: Medicaid Other | Source: Ambulatory Visit | Attending: Urgent Care | Admitting: Urgent Care

## 2018-08-25 ENCOUNTER — Ambulatory Visit: Admission: RE | Admit: 2018-08-25 | Payer: Disability Insurance | Source: Ambulatory Visit

## 2018-08-25 MED ORDER — SODIUM CHLORIDE 0.9 % IV SOLN: INTRAVENOUS | Status: DC

## 2018-08-26 ENCOUNTER — Telehealth: Payer: Self-pay

## 2018-08-26 NOTE — Telephone Encounter (Signed)
Call placed to Mr. Brands due to him missing his liver biopsy appointment. He stated he just did not feel well and we can reschedule. We will reschedule liver biopsy and have his appointment with Dr.Corcoran rescheduled for results. Message sent to scheduling. Oncology Nurse Navigator Documentation  Navigator Location: [facility] (08/26/18 1500)   )Navigator Encounter Type: Telephone (08/26/18 1500) Telephone: Outgoing Call (08/26/18 1500)                                                  Time Spent with Patient: 15 (08/26/18 1500)

## 2018-08-29 ENCOUNTER — Inpatient Hospital Stay: Payer: Disability Insurance | Admitting: Hematology and Oncology

## 2018-09-02 ENCOUNTER — Telehealth: Payer: Self-pay

## 2018-09-02 NOTE — Telephone Encounter (Signed)
Vernon [name] called and biopsy was rescheduled for 10/10. Specials will call him with further instructions. Follow up with Dr. Mike Gip for results being arranged. Oncology Nurse Navigator Documentation  Navigator Location: [facility] (09/02/18 1000)   )Navigator Encounter Type: Telephone (09/02/18 1000) Telephone: Appt Confirmation/Clarification (09/02/18 1000)                                                  Time Spent with Patient: 15 (09/02/18 1000)

## 2018-09-02 NOTE — Telephone Encounter (Signed)
Called and spoke with Vernon [name]. Scheduling has attempted to contact him several times with no success. At this time they need him to call to arrange for his liver biopsy. I have given him the phone number and he stated he would call as soon as we hung up. Scheduling notified. Oncology Nurse Navigator Documentation  Navigator Location: [facility] (09/02/18 0900)   )Navigator Encounter Type: Telephone (09/02/18 0900) Telephone: Outgoing Call (09/02/18 0900)                                                  Time Spent with Patient: 15 (09/02/18 0900)

## 2018-09-07 ENCOUNTER — Other Ambulatory Visit: Payer: Self-pay | Admitting: Student

## 2018-09-08 ENCOUNTER — Ambulatory Visit
Admission: RE | Admit: 2018-09-08 | Discharge: 2018-09-08 | Disposition: A | Discharge status: Home / Self Care | Payer: Medicaid Other | Source: Ambulatory Visit | Attending: Urgent Care | Admitting: Urgent Care

## 2018-09-08 DIAGNOSIS — R160 Hepatomegaly, not elsewhere classified: Secondary | ICD-10-CM | POA: Diagnosis present

## 2018-09-08 DIAGNOSIS — Z79899 Other long term (current) drug therapy: Secondary | ICD-10-CM | POA: Insufficient documentation

## 2018-09-08 DIAGNOSIS — C220 Liver cell carcinoma: Secondary | ICD-10-CM | POA: Diagnosis not present

## 2018-09-08 DIAGNOSIS — M109 Gout, unspecified: Secondary | ICD-10-CM | POA: Insufficient documentation

## 2018-09-08 DIAGNOSIS — B1920 Unspecified viral hepatitis C without hepatic coma: Secondary | ICD-10-CM | POA: Insufficient documentation

## 2018-09-08 DIAGNOSIS — Z87891 Personal history of nicotine dependence: Secondary | ICD-10-CM | POA: Diagnosis not present

## 2018-09-08 DIAGNOSIS — Z8249 Family history of ischemic heart disease and other diseases of the circulatory system: Secondary | ICD-10-CM | POA: Insufficient documentation

## 2018-09-08 LAB — CBC
HEMATOCRIT: 42.9 % (ref 39.0–52.0)
HEMOGLOBIN: 14.1 g/dL (ref 13.0–17.0)
MCH: 32.5 pg (ref 26.0–34.0)
MCHC: 32.9 g/dL (ref 30.0–36.0)
MCV: 98.8 fL (ref 80.0–100.0)
NRBC: 0 % (ref 0.0–0.2)
Platelets: 119 10*3/uL — ABNORMAL LOW (ref 150–400)
RBC: 4.34 MIL/uL (ref 4.22–5.81)
RDW: 12 % (ref 11.5–15.5)
WBC: 5.6 10*3/uL (ref 4.0–10.5)

## 2018-09-08 LAB — PROTIME-INR
INR: 0.9
Prothrombin Time: 12.1 seconds (ref 11.4–15.2)

## 2018-09-08 LAB — APTT: aPTT: 26 seconds (ref 24–36)

## 2018-09-08 MED ORDER — SODIUM CHLORIDE 0.9 % IV SOLN
INTRAVENOUS | Status: DC
  Administered 2018-09-08: 09:00:00 via INTRAVENOUS

## 2018-09-08 MED ORDER — MIDAZOLAM HCL 5 MG/5ML IJ SOLN
INTRAMUSCULAR | Status: AC | PRN
  Administered 2018-09-08: 0.5 mg via INTRAVENOUS
  Administered 2018-09-08 (×2): 1 mg via INTRAVENOUS

## 2018-09-08 MED ORDER — FENTANYL CITRATE (PF) 100 MCG/2ML IJ SOLN
INTRAMUSCULAR | Status: AC
  Filled 2018-09-08: qty 4

## 2018-09-08 MED ORDER — MIDAZOLAM HCL 5 MG/5ML IJ SOLN
INTRAMUSCULAR | Status: AC
  Filled 2018-09-08: qty 5

## 2018-09-08 MED ORDER — FENTANYL CITRATE (PF) 100 MCG/2ML IJ SOLN
INTRAMUSCULAR | Status: AC | PRN
  Administered 2018-09-08: 25 ug via INTRAVENOUS
  Administered 2018-09-08: 50 ug via INTRAVENOUS

## 2018-09-08 NOTE — Consult Note (Signed)
Chief Complaint: Liver masses  Referring Physician(s): Corcoran  Patient Status: [facility] - Out-pt  History of Present Illness: Vernon [name] is a 57 y.o. male with past medical history significant for gout and hepatitis C, now with multiple indeterminate liver lesions worrisome for multifocal hepatocellular carcinoma.  Patient presents today for ultrasound-guided liver lesion biopsy.  The patient is accompanied by his girlfriend though serves as his own historian.  Patient is currently without complaint.  Specifically, no abdominal pain.  No chest pain or shortness of breath.  No fever or chills.  No yellowing of the skin or eyes.  No increased abdominal girth.  No altered mental status.  Past Medical History:  Diagnosis Date  . Gout     No past surgical history on file.  Allergies: Patient has no known allergies.  Medications: Prior to Admission medications   Medication Sig Start Date End Date Taking? Authorizing Provider  allopurinol (ZYLOPRIM) 100 MG tablet Take 1 tablet (100 mg total) by mouth daily. 06/13/18  Yes Mikey College, NP  colchicine 0.6 MG tablet Take 1 tablet (0.6 mg total) by mouth daily. Patient not taking: Reported on 07/29/2018 05/25/18 05/25/19  Sable Feil, PA-C  cyclobenzaprine (FLEXERIL) 10 MG tablet Take 1 tablet (10 mg total) by mouth 3 (three) times daily as needed. Patient not taking: Reported on 07/29/2018 05/25/18   Sable Feil, PA-C     Family History  Problem Relation Age of Onset  . Cancer Mother   . Cancer Father   . Hypertension Maternal Uncle   . Healthy Daughter   . Healthy Son     Social History   Socioeconomic History  . Marital status: Single    Spouse name: Not on file  . Number of children: 3  . Years of education: 10  . Highest education level: 10th grade  Occupational History  . 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 Financial resource strain: Somewhat hard  .  Food insecurity:    Worry: Sometimes true    Inability: Sometimes true  . Transportation needs:    Medical: No    Non-medical: No  Tobacco Use  . Smoking status: Former Smoker    Years: 18.00    Types: Cigarettes    Last attempt to quit: 08/25/2018    Years since quitting: 0.0  . Smokeless tobacco: Never Used  . Tobacco comment: smokes about 3 cigarretes a month  Substance and Sexual Activity  . Alcohol use: Not Currently    Comment: occasional beer  . Drug use: Never  . Sexual activity: Not Currently  Lifestyle  . Physical activity:    Days per week: 2 days    Minutes per session: Not on file  . Stress: To some extent  Relationships  . Social connections:    Talks on phone: Not on file    Gets together: Not on file    Attends religious service: Not on file    Active member of club or organization: Not on file    Attends meetings of clubs or organizations: Not on file    Relationship status: Not on file  Other Topics Concern  . Not on file  Social History Narrative  . Not on file    ECOG Status: 0 - Asymptomatic  Review of Systems: A 12 point ROS discussed and pertinent positives are indicated in the HPI above.  All other systems are negative.  Review of Systems  Constitutional: Negative for activity change, fatigue, fever and unexpected weight change.  Cardiovascular: Negative.   Gastrointestinal: Negative for abdominal pain.  Skin: Negative for color change.  Psychiatric/Behavioral: Negative for confusion.    Vital Signs: BP 139/89   Pulse 83   Temp 97.6 F (36.4 C) (Oral)   Resp 16   Ht 5\' 6"  (1.676 m)   Wt 75.8 kg   SpO2 99%   BMI 26.95 kg/m   Physical Exam  Constitutional: He appears well-developed.  HENT:  Head: Normocephalic and atraumatic.  Cardiovascular: Normal rate and regular rhythm.  Pulmonary/Chest: Effort normal and breath sounds normal.  Skin: Skin is warm and dry.  Psychiatric: He has a normal mood and affect. His behavior is  normal.  Nursing note and vitals reviewed.   Imaging: Nm Pet Image Initial (pi) Skull Base To Thigh  Result Date: 08/11/2018 CLINICAL DATA:  Initial treatment strategy for liver masses. EXAM: NUCLEAR MEDICINE PET SKULL BASE TO THIGH TECHNIQUE: 8.6 mCi F-18 FDG was injected intravenously. Full-ring PET imaging was performed from the skull base to thigh after the radiotracer. CT data was obtained and used for attenuation correction and anatomic localization. Fasting blood glucose: 119 mg/dl COMPARISON:  Abdomen MRI on 07/15/2018 FINDINGS: (Background/reference mediastinal blood pool activity: SUV max = 2.2) NECK:  No hypermetabolic lymph nodes or masses. Incidental CT findings:  None. CHEST: No hypermetabolic masses or lymphadenopathy. No suspicious pulmonary nodules seen on CT images. Incidental CT findings:  None. ABDOMEN/PELVIS: No abnormal hypermetabolic activity within the liver, pancreas, adrenal glands, or spleen. No hypermetabolic lymph nodes in the abdomen or pelvis. Incidental CT findings:  None. SKELETON: No focal hypermetabolic bone lesions to suggest skeletal metastasis. Incidental CT findings:  None. IMPRESSION: No hypermetabolic masses identified within the liver, or elsewhere within the neck, chest, abdomen, or pelvis. This makes hepatic metastases extremely unlikely, however multifocal hepatocellular carcinoma cannot be excluded. Recommend correlation with tumor markers, and consider ultrasound-guided needle biopsy for tissue diagnosis. Electronically Signed   By: Earle Gell M.D.   On: 08/11/2018 14:02    Labs:  CBC: Recent Labs    05/25/18 1119 07/29/18 1511  WBC 4.7 5.5  HGB 13.7 12.8*  HCT 40.5 38.2*  PLT 105* 128*    COAGS: Recent Labs    07/29/18 1511  INR 0.97    BMP: Recent Labs    05/25/18 1119 06/13/18 1139 07/29/18 1511  NA 138 139 138  K 5.0 5.4* 4.9  CL 103 104 105  CO2 26 29 26   GLUCOSE 131* 123* 109*  BUN 25* 15 16  CALCIUM 9.4 9.4 9.0    CREATININE 1.21 1.15 1.15  GFRNONAA >60 69 >60  GFRAA >60 80 >60    LIVER FUNCTION TESTS: Recent Labs    06/13/18 1139 07/29/18 1511  BILITOT 0.9 0.5  AST 122* 89*  ALT 83* 75*  ALKPHOS  --  102  PROT 7.7 7.6  ALBUMIN  --  3.6    TUMOR MARKERS: No results for input(s): AFPTM, CEA, CA199, CHROMGRNA in the last 8760 hours.  Assessment and Plan:  Vernon [name] is a 57 y.o. male with past medical history significant for gout and hepatitis C, now with multiple indeterminate liver lesions worrisome for multifocal hepatocellular carcinoma.  Patient presents today for ultrasound-guided liver lesion biopsy.    Patient is currently without complaint.    Risks and benefits of US guided liver lesion biopsy was discussed with the patient including, but not limited to bleeding,  infection, damage to adjacent structures or low yield requiring additional tests.  All of the patient's questions were answered, patient is agreeable to proceed.  Consent signed and in chart.  Thank you for this interesting consult.  I greatly enjoyed meeting Vernon [name] and look forward to participating in their care.  A copy of this report was sent to the requesting provider on this date.  Electronically Signed: Sandi Mariscal, MD 09/08/2018, 8:55 AM   I spent a total of 15 Minutes in face to face in clinical consultation, greater than 50% of which was counseling/coordinating care for liver lesion biopsy.

## 2018-09-08 NOTE — Progress Notes (Signed)
Patient clinically stable post liver biopsy per Dr Pascal Lux, vitals stable. Pt denies complaints at this time. Dr Pascal Lux out to speak with patient with questions answered.

## 2018-09-08 NOTE — Progress Notes (Signed)
No changes, vitals remain stable. Denies complaints. Girlfriend with patient. Discharge instructions given with questions answered.

## 2018-09-08 NOTE — Discharge Instructions (Signed)
Moderate Conscious Sedation, Adult, Care After °These instructions provide you with information about caring for yourself after your procedure. Your health care provider may also give you more specific instructions. Your treatment has been planned according to current medical practices, but problems sometimes occur. Call your health care provider if you have any problems or questions after your procedure. °What can I expect after the procedure? °After your procedure, it is common: °· To feel sleepy for several hours. °· To feel clumsy and have poor balance for several hours. °· To have poor judgment for several hours. °· To vomit if you eat too soon. ° °Follow these instructions at home: °For at least 24 hours after the procedure: ° °· Do not: °? Participate in activities where you could fall or become injured. °? Drive. °? Use heavy machinery. °? Drink alcohol. °? Take sleeping pills or medicines that cause drowsiness. °? Make important decisions or sign legal documents. °? Take care of children on your own. °· Rest. °Eating and drinking °· Follow the diet recommended by your health care provider. °· If you vomit: °? Drink water, juice, or soup when you can drink without vomiting. °? Make sure you have little or no nausea before eating solid foods. °General instructions °· Have a responsible adult stay with you until you are awake and alert. °· Take over-the-counter and prescription medicines only as told by your health care provider. °· If you smoke, do not smoke without supervision. °· Keep all follow-up visits as told by your health care provider. This is important. °Contact a health care provider if: °· You keep feeling nauseous or you keep vomiting. °· You feel light-headed. °· You develop a rash. °· You have a fever. °Get help right away if: °· You have trouble breathing. °This information is not intended to replace advice given to you by your health care provider. Make sure you discuss any questions you have  with your health care provider. °Document Released: 09/06/2013 Document Revised: 04/20/2016 Document Reviewed: 03/07/2016 °Elsevier Interactive Patient Education © 2018 [employer] ° ° °Liver Biopsy, Care After °These instructions give you information on caring for yourself after your procedure. Your doctor may also give you more specific instructions. Call your doctor if you have any problems or questions after your procedure. °Follow these instructions at home: °· Rest at home for 1-2 days or as told by your doctor. °· Have someone stay with you for at least 24 hours. °· Do not do these things in the first 24 hours: °? Drive. °? Use machinery. °? Take care of other people. °? Sign legal documents. °? Take a bath or shower. °· There are many different ways to close and cover a cut (incision). For example, a cut can be closed with stitches, skin glue, or adhesive strips. Follow your doctor's instructions on: °? Taking care of your cut. °? Changing and removing your bandage (dressing). °? Removing whatever was used to close your cut. °· Do not drink alcohol in the first week. °· Do not lift more than 5 pounds or play contact sports for the first 2 weeks. °· Take medicines only as told by your doctor. For 1 week, do not take medicine that has aspirin in it or medicines like ibuprofen. °· Get your test results. °Contact a doctor if: °· A cut bleeds and leaves more than just a small spot of blood. °· A cut is red, puffs up (swells), or hurts more than before. °· Fluid or something else   comes from a cut. °· A cut smells bad. °· You have a fever or chills. °Get help right away if: °· You have swelling, bloating, or pain in your belly (abdomen). °· You get dizzy or faint. °· You have a rash. °· You feel sick to your stomach (nauseous) or throw up (vomit). °· You have trouble breathing, feel short of breath, or feel faint. °· Your chest hurts. °· You have problems talking or seeing. °· You have trouble balancing or moving  your arms or legs. °This information is not intended to replace advice given to you by your health care provider. Make sure you discuss any questions you have with your health care provider. °Document Released: 08/25/2008 Document Revised: 04/23/2016 Document Reviewed: 01/12/2014 °Elsevier Interactive Patient Education © 2018 [employer] ° ° °

## 2018-09-08 NOTE — Procedures (Signed)
Pre Procedure Dx: Liver lesion Post Procedural Dx: Same  Technically successful US guided biopsy of indeterminate mass within the right lobe of the liver.   EBL: None  No immediate complications.   Ronny Bacon, MD Pager #: [phone]

## 2018-09-09 ENCOUNTER — Other Ambulatory Visit: Payer: Self-pay | Admitting: Hematology and Oncology

## 2018-09-09 LAB — SURGICAL PATHOLOGY

## 2018-09-14 ENCOUNTER — Encounter: Payer: Self-pay | Admitting: Hematology and Oncology

## 2018-09-14 ENCOUNTER — Inpatient Hospital Stay: Payer: Medicaid Other | Attending: Hematology and Oncology | Admitting: Hematology and Oncology

## 2018-09-14 ENCOUNTER — Inpatient Hospital Stay: Payer: Medicaid Other

## 2018-09-14 ENCOUNTER — Other Ambulatory Visit: Payer: Self-pay | Admitting: Hematology and Oncology

## 2018-09-14 VITALS — BP 122/83 | HR 96 | Temp 95.7°F | Resp 18 | Wt 164.2 lb

## 2018-09-14 DIAGNOSIS — C20 Malignant neoplasm of rectum: Secondary | ICD-10-CM | POA: Diagnosis not present

## 2018-09-14 DIAGNOSIS — Z7189 Other specified counseling: Secondary | ICD-10-CM

## 2018-09-14 DIAGNOSIS — C220 Liver cell carcinoma: Secondary | ICD-10-CM

## 2018-09-14 DIAGNOSIS — K759 Inflammatory liver disease, unspecified: Secondary | ICD-10-CM

## 2018-09-14 DIAGNOSIS — B1920 Unspecified viral hepatitis C without hepatic coma: Secondary | ICD-10-CM | POA: Diagnosis not present

## 2018-09-14 LAB — HEPATIC FUNCTION PANEL
ALT: 126 U/L — ABNORMAL HIGH (ref 0–44)
AST: 148 U/L — ABNORMAL HIGH (ref 15–41)
Albumin: 4 g/dL (ref 3.5–5.0)
Alkaline Phosphatase: 102 U/L (ref 38–126)
Bilirubin, Direct: 0.3 mg/dL — ABNORMAL HIGH (ref 0.0–0.2)
Indirect Bilirubin: 0.9 mg/dL (ref 0.3–0.9)
Total Bilirubin: 1.2 mg/dL (ref 0.3–1.2)
Total Protein: 8.9 g/dL — ABNORMAL HIGH (ref 6.5–8.1)

## 2018-09-14 NOTE — Progress Notes (Signed)
[facility] day:  [name]  Chief Complaint: Vernon [name] is a 57 y.o. male with liver masses who is seen for review of interval PET scan and liver biopsy and discussion regarding direction of therapy.  HPI:  The patient was last seen in the medical oncology clinic on 07/29/2018 for new patient assessment.   He had multiple liver lesions worrisome for hepatocellular carcinoma or metastatic disease.   Exam reveals hepatomegaly and right neck fullness.  Laboratory work-up was positive for hepatitis C.  AFP was 21.6 (0-8.3).  PT was 12.8 (INR 0.97).  CEA was 7.5.  Hematocrit was 38.2, hemoglobin 12.8, MCV 99.2, and platelets 128,000.  AST was 89 and ALT 75.  Negative studies included:  hepatitis B core antibody and hemochromatosis assay.  PET scan on 08/11/2018 revealed no hypermetabolic masses identified within the liver, or elsewhere within the neck, chest, abdomen, or pelvis. Imaging made hepatic metastases extremely unlikely, however multifocal hepatocellular carcinoma could not be excluded.  He missed his appointment on 08/12/2018.  Liver biopsy was scheduled.  He missed his initial liver biopsy appointment.  He underwent ultrasound guided liver biopsy on 09/08/2018.  Pathology revealed hepatocellular carcinoma, moderately differentiated.  There was background liver with focally intense lymphoplasmacytic infiltrate and areas worrisome for parenchymal necrosis.  These findings raise the possibility of an underlying and concurrent autoimmune hepatitis.  Symptomatically, patient is doing well overall. He notes some exertional dyspnea and difficulties with his sleep. Patient states, " I feel like I am even too tired to sleep. I up up every hour on the hour". Patient has been using Tylenol pm, which has been ineffective in improving his ability to fall asleep, and his overall sleep quality.   He denies abdominal pain, nausea, and vomiting. He has not  experienced any changes to his bowel habits. Patient denies that he has experienced any B symptoms. He denies any interval infections. Patient advises that he maintains an adequate appetite. He is eating well. Weight today is 164 lb 3.9 oz (74.5 kg), which compared to his last visit to the clinic, represents a stable weight.   Patient denies pain in the clinic today.   Past Medical History:  Diagnosis Date  . Gout     History reviewed. No pertinent surgical history.  Family History  Problem Relation Age of Onset  . Cancer Mother   . Cancer Father   . Hypertension Maternal Uncle   . Healthy Daughter   . Healthy Son     Social History:  reports that he quit smoking about 2 weeks ago. His smoking use included cigarettes. He quit after 18.00 years of use. He has never used smokeless tobacco. He reports that he drank alcohol. He reports that he does not use drugs.  He smokes 1-2 cigarettes/day. He previously smoked 1 pack/day x 10 years.  He drinks 1-2 beers/night (40 oz).  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  He is trying to get disability.  He lives with his girlfriend.  The patient is accompanied by his daughter, Vernon [name], today.  Allergies: No Known Allergies  Possible tape allergy.  Current Medications: Current Outpatient Medications  Medication Sig Dispense Refill  . allopurinol (ZYLOPRIM) 100 MG tablet Take 1 tablet (100 mg total) by mouth daily. 30 tablet 6  . colchicine 0.6 MG tablet Take 1 tablet (0.6 mg total) by mouth daily. 30 tablet 0  . cyclobenzaprine (FLEXERIL) 10 MG tablet  Take 1 tablet (10 mg total) by mouth 3 (three) times daily as needed. 15 tablet 0   No current facility-administered medications for this visit.      Review of Systems  Constitutional: Negative for diaphoresis, fever, malaise/fatigue and weight loss (stable).  HENT: Negative.   Eyes: Negative.   Respiratory: Positive for shortness of breath (exertional). Negative for  cough, hemoptysis and sputum production.   Cardiovascular: Negative for chest pain, palpitations, orthopnea, leg swelling and PND.  Gastrointestinal: Negative for abdominal pain, blood in stool, constipation, diarrhea, melena, nausea and vomiting.  Genitourinary: Negative for dysuria, frequency, hematuria and urgency.  Musculoskeletal: Negative for back pain, falls, joint pain and myalgias.  Skin: Negative for itching and rash.  Neurological: Negative for dizziness, tremors, weakness and headaches.  Endo/Heme/Allergies: Does not bruise/bleed easily.  Psychiatric/Behavioral: Negative for depression, memory loss and suicidal ideas. The patient has insomnia. The patient is not nervous/anxious.   All other systems reviewed and are negative.  Performance status (ECOG): 1 - Symptomatic but completely ambulatory  Vital Signs BP 122/83 (BP Location: Left Arm, Patient Position: Sitting)   Pulse 96   Temp (!) 95.7 F (35.4 C) (Tympanic)   Resp 18   Wt 164 lb 3.9 oz (74.5 kg)   BMI 26.51 kg/m   Physical Exam  Constitutional: He is oriented to person, place, and time and well-developed, well-nourished, and in no distress.  HENT:  Head: Normocephalic and atraumatic.  Mouth/Throat: Oropharynx is clear and moist and mucous membranes are normal.  Wearing black and red [place] cap. Black hair.   Eyes: Pupils are equal, round, and reactive to light. EOM are normal. No scleral icterus.  Brown eyes.   Neck: Normal range of motion. Neck supple. No tracheal deviation present. No thyromegaly present.  Cardiovascular: Normal rate, regular rhythm, normal heart sounds and intact distal pulses. Exam reveals no gallop and no friction rub.  No murmur heard. Pulmonary/Chest: Effort normal and breath sounds normal. No respiratory distress. He has no wheezes. He has no rales.  Abdominal: Soft. Bowel sounds are normal. He exhibits no distension. There is hepatomegaly (3 FB below RCM). There is no tenderness.   Musculoskeletal: Normal range of motion. He exhibits no edema or tenderness.  Lymphadenopathy:    He has no cervical adenopathy.    He has no axillary adenopathy.       Right: No inguinal and no supraclavicular adenopathy present.       Left: No inguinal and no supraclavicular adenopathy present.  Neurological: He is alert and oriented to person, place, and time.  Skin: Skin is warm and dry. No rash noted. No erythema.  Psychiatric: Mood, affect and judgment normal.  Nursing note and vitals reviewed.   No visits with results within 3 Day(s) from this visit.  Latest known visit with results is:  Hospital Outpatient Visit on 09/08/2018  Component Date Value Ref Range Status  . aPTT 09/08/2018 26  24 - 36 seconds Final   Performed at [facility], [address] [place], [place] [address]  . WBC 09/08/2018 5.6  4.0 - 10.5 K/uL Final  . RBC 09/08/2018 4.34  4.22 - 5.81 MIL/uL Final  . Hemoglobin 09/08/2018 14.1  13.0 - 17.0 g/dL Final  . HCT 09/08/2018 42.9  39.0 - 52.0 % Final  . MCV 09/08/2018 98.8  80.0 - 100.0 fL Final  . MCH 09/08/2018 32.5  26.0 - 34.0 pg Final  . MCHC 09/08/2018 32.9  30.0 - 36.0 g/dL Final  .  RDW 09/08/2018 12.0  11.5 - 15.5 % Final  . Platelets 09/08/2018 119* 150 - 400 K/uL Final  . nRBC 09/08/2018 0.0  0.0 - 0.2 % Final   Performed at [facility], [address], [place], [place] [address]  . Prothrombin Time 09/08/2018 12.1  11.4 - 15.2 seconds Final  . INR 09/08/2018 0.90   Final   Performed at [facility], [address] [place], [place] [address]  . SURGICAL PATHOLOGY 09/08/2018    Final                   Value:Surgical Pathology CASE: [id number] PATIENT: Kathlen Mody Surgical Pathology Report     SPECIMEN SUBMITTED: A. Liver mass; bx  CLINICAL HISTORY: History of HCC, now with multiple liver lesions and masses worrisome for multifocal HCC, AFP elevated  PRE-OPERATIVE DIAGNOSIS: HCC  POST-OPERATIVE  DIAGNOSIS: Same as above     DIAGNOSIS: A. LIVER MASS; ULTRASOUND-GUIDED BIOPSY: - HEPATOCELLULAR CARCINOMA, MODERATELY DIFFERENTIATED (WHO 2019). - BACKGROUND LIVER WITH FOCALLY INTENSE LYMPHOPLASMACYTIC INFILTRATE AND AREAS WORRISOME FOR PARENCHYMAL NECROSIS, SEE COMMENT.  Comment: The patient's history of hepatitis C is noted.  The background liver demonstrates areas of intense lymphoplasmacytic inflammation with brisk piecemeal necrosis.  Areas worrisome for parenchymal necrosis are present.  These findings raise the possibility of an underlying and concurrent autoimmune hepatitis.  Recommend autoimmune hepatitis serologies (e.g. ANA).  These findings were                          discussed with Dr. Mike Gip on 09/09/2018.   GROSS DESCRIPTION: A. Labeled: Liver mass biopsy Received: In formalin Tissue fragment(s): 5 Size: 0.6 to 2.5 cm in length and in diameter 0.1 cm Description: Pink-tan cores of tissue Entirely submitted in two cassettes.    Final Diagnosis performed by Quay Burow, MD.   Electronically signed 09/09/2018 11:20:51AM The electronic signature indicates that the named Attending Pathologist has evaluated the specimen  Technical component performed at [facility], [address], [place], [place] [address] Lab: [phone] Dir: Rush Farmer, MD, MMM  Professional component performed at [facility], [facility], [place], [place], [place] [address] Lab: [phone] Dir: Dellia Nims. Rubinas, MD     Assessment:  DRAYKE GRABEL is a 57 y.o. male with multi-focal hepatocellular carcinoma s/p ultrasound guided liver biopsy on 09/08/2018.  He presented with multiple liver lesions.  He is Child Pugh class A (score 5).  Pathology on 12/09/2017 revealed hepatocellular carcinoma, moderately differentiated.  The background liver revealed focally intense lymphoplasmacytic infiltrate and areas worrisome for parenchymal necrosis.  These findings raise  the possibility of an underlying and concurrent autoimmune hepatitis.  Liver MRI on 07/15/2018 revealed multiple hypervascular liver lesions highly suspicious for malignancy.  There was a 5.5 x 4.5 cm lesion in segment 8 and multiple other smaller lesions (up to 2 cm) in both the right and left hepatic lobe.  There was no gross morphologic changes of cirrhosis.    PET scan on 08/11/2018 revealed no hypermetabolic masses identified within the liver, or elsewhere within the neck, chest, abdomen, or pelvis.  Laboratory work-up on 07/29/2018 revealed hepatitis C.  AFP was 21.6 (0-8.3).  CEA was 7.5.  Negative studies included:  hepatitis B core antibody and hemochromatosis assay.  He has never had a colonoscopy. Ferritin was 254 and iron saturation was 52% on 05/30/2018.  He has a > 10 pack year smoking history.  Symptomatically, patient is feeling  better overall. His major complaint today is issues with his sleep quality. Patient states, "I am to tired to even sleep". He is using Tylenol pm with little to no appreciated improvements. Patient continues to have periods of exertional dyspnea. Exam reveals hepatomegaly.   Plan: 1.   Review laboratory work-up.  Elevated AFP and CEA.  Hepatitis C (+).  2.   Review PET scan.  No evidence of metastatic disease.  Concern raised for multi-focal hepatocellular carcinoma. 3.   Hepatocellular carcinoma:  Review liver biopsy- moderately differentiated hepatocellular carcinoma.  Patient is not a surgical candidate.  Child Pugh class A.  Discuss systemic treatment (sorafenib or lenvatinib).   Will discuss case with Dr. Leslie Andrea and refer for consult based on recommendations.  Initiation of therapy with these medications will be based on results of further lab testing that is being obtained today.    Will need to schedule baseline EKG and echocardiogram if plans are to pursue therapy with either of these medications.   Concern for autoimmune  hepatitis. 4.  Possible autoimmune hepatitis:  Discuss unusual pathology with intense lymphoplasmacytic infiltrate and parenchymal necrosis.   Discuss work-up and referral to tertiary care center.  Labs today: LFTs, ANA with reflex, anti-smooth muscle antibodies, anti-mitochondrial antibodies, anti-liver/kidney microsomal antibodies, IgG.   5.  Hepatitis C:  Labs today: hepatitis C genotype and quantitative assay. 6.  RTC based on test results - will call for appt.   Addendum:  Hepatitis C genotype 1b.  Hepatitis C RNA 11,400,000 IU/ml.  Hepatitis C 7.057 log 10 IU/ml.  ANA negative.  Mitochondrial/smooth muscle antibody panel negative, antimicrosomal antibody-liver/kidney negative, IgG 1603, AST 148, ALT 126, and bilirubin 0.3.   Honor Loh, NP  [name], 3:30 PM   I saw and evaluated the patient, participating in the key portions of the service and reviewing pertinent diagnostic studies and records.  I reviewed the nurse practitioner's note and agree with the findings and the plan.  The assessment and plan were discussed with the patient.  Multiple questions were asked by the patient and answered.   Nolon Stalls, MD [name],3:30 PM

## 2018-09-14 NOTE — Patient Instructions (Addendum)
Lenvatinib oral capsule What is this medicine? LENVATINIB (len VA ti nib) is a medicine that targets proteins in cancer cells and stops the cancer cells from growing. It is used to treat thyroid cancer and kidney cancer. This medicine may be used for other purposes; ask your health care provider or pharmacist if you have questions. COMMON BRAND NAME(S): [place] What should I tell my health care provider before I take this medicine? They need to know if you have any of these conditions: -diabetes -high blood pressure -heart disease -history of blood clots -history of a drug or alcohol abuse problem -history of irregular heartbeat -history of low levels of calcium, magnesium, or potassium in the blood -inflammatory bowel disease -kidney disease -liver disease -protein in your urine -an unusual or allergic reaction to lenvatinib, other medicines, foods, dyes, or preservatives -pregnant or trying to get pregnant -breast-feeding How should I use this medicine? Take this medicine by mouth with a glass of water. Follow the directions on the prescription label. Swallow the capsules whole. Do not cut, crush or chew this medicine. Take your medicine at regular intervals. Do not take it more often than directed. Do not stop taking except on your doctor's advice. Talk to your pediatrician regarding the use of this medicine in children. Special care may be needed. Overdosage: If you think you have taken too much of this medicine contact a poison control center or emergency room at once. NOTE: This medicine is only for you. Do not share this medicine with others. What if I miss a dose? If you miss a dose, take it as soon as you can. If your next dose is to be taken in less than 12 hours, then do not take the missed dose. Take the next dose at your regular time. Do not take double or extra doses. What may interact with this medicine? Do not take this medicine with any of the following  medications: -cisapride -dofetilide -dronedarone -pimozide -thioridazine -ziprasidone This medicine may also interact with the following medications: -alfuzosin -bedaquiline -certain antibiotics like azithromycin, clarithromycin or erythromycin -certain medicines for bladder problems like solifenacin, tolterodine -certain medicines for depression, anxiety, or psychotic disturbances -certain medicines for fungal infections like ketoconazole, posaconazole, voriconazole, fluconazole, and itraconazole -certain medicines for irregular heart beat like amiodarone, dofetilide, flecainide, propafenone, quinidine -chloroquine -ciprofloxacin -cyclobenzaprine -ezogabine -fingolimod -granisetron -leuprolide -ritonavir -lopinavir; ritonavir -methadone -metronidazole -mifepristone -octreotide -ondansetron -other medicines that prolong the QT interval (cause an abnormal heart rhythm) -pasireotide -pentamidine -promethazine -quinine -ranolazine -rifampin -rilpivirine -romidepsin -saquinavir -tacrolimus -telavancin -telithromycin -tetrabenazine -tizanidine -toremifene -vardenafil -vorinostat This list may not describe all possible interactions. Give your health care provider a list of all the medicines, herbs, non-prescription drugs, or dietary supplements you use. Also tell them if you smoke, drink alcohol, or use illegal drugs. Some items may interact with your medicine. What should I watch for while using this medicine? Visit your doctor for regular check ups. Report any side effects. Continue your course of treatment unless your doctor tells you to stop. You will need blood work done while you are taking this medicine. Do not become pregnant while taking this medicine or for 2 weeks after stopping it. Women should inform their doctor if they wish to become pregnant or think they might be pregnant. There is a potential for serious side effects to an unborn child. Talk to your  health care professional or pharmacist for more information. Do not breast-feed an infant while taking this medicine. Be careful brushing and flossing  your teeth or using a toothpick because you may get an infection or bleed more easily. If you have any dental work done, tell your dentist you are receiving this medicine. This medicine may increase your risk to bruise or bleed. Call your doctor or health care professional if you notice any unusual bleeding. This drug may make you feel generally unwell. This is not uncommon, as chemotherapy can affect healthy cells as well as cancer cells. Report any side effects. Continue your course of treatment even though you feel ill unless your doctor tells you to stop. This medicine may interfere with the ability to have a child. You should talk with your doctor or health care professional if you are concerned about your fertility. What side effects may I notice from receiving this medicine? Side effects that you should report to your doctor or health care professional as soon as possible: -allergic reactions like skin rash, itching or hives, swelling of the face, lips, or tongue -breathing problems -chest pain or palpitations -dizziness -feeling faint or lightheaded, falls -headache -high blood pressure -seizures -signs and symptoms of bleeding such as bloody or black, tarry stools; red or dark-brown urine; spitting up blood or brown material that looks like coffee grounds; red spots on the skin; unusual bruising or bleeding from the eye, gums, or nose -signs and symptoms of a dangerous change in heartbeat or heart rhythm like chest pain; dizziness; fast or irregular heartbeat; palpitations; feeling faint or lightheaded, falls; breathing problems -signs and symptoms of kidney injury like trouble passing urine or change in the amount of urine -signs and symptoms of liver injury like dark yellow or brown urine; general ill feeling or flu-like symptoms;  light-colored stools; loss of appetite; nausea; right upper belly pain; unusually weak or tired; yellowing of the eyes or skin -signs and symptoms of low potassium like muscle cramps or muscle pain; chest pain; dizziness; feeling faint or lightheaded, falls; palpitations; breathing problems; or fast, irregular heartbeat -signs and symptoms of a stroke like changes in vision; confusion; trouble speaking or understanding; severe headaches; sudden numbness or weakness of the face, arm or leg; trouble walking; dizziness; loss of balance or coordination -stomach pain -swelling of the legs or ankles -unusually weak or tired Side effects that usually do not require medical attention (report to your doctor or health care professional if they continue or are bothersome): -diarrhea -joint pain -loss of appetite -mouth sores -muscle pain -nausea, vomiting -weight loss This list may not describe all possible side effects. Call your doctor for medical advice about side effects. You may report side effects to FDA at 1-800-FDA-1088. Where should I keep my medicine? Keep out of the reach of children. Store between 20 and 25 degrees C (68 and 77 degrees F). Throw away any unused medicine after the expiration date. NOTE: This sheet is a summary. It may not cover all possible information. If you have questions about this medicine, talk to your doctor, pharmacist, or health care provider.  2018 Elsevier/Gold Standard (2015-12-19 11:15:33) Sorafenib Oral Tablet What is this medicine? SORAFENIB (soe RAF e nib) is a medicine that targets proteins in cancer cells and stops the cancer cells from growing. It is used to treat liver cancer, kidney cancer, and thyroid cancer. This medicine may be used for other purposes; ask your health care provider or pharmacist if you have questions. COMMON BRAND NAME(S): Nexavar What should I tell my health care provider before I take this medicine? They need to know if you  have  any of these conditions: -bleeding problems -heart disease -high blood pressure -kidney disease -liver disease -lung cancer -recent surgery -an unusual or allergic reaction to sorafenib, other medicines, foods, dyes, or preservatives -pregnant or trying to get pregnant -breast-feeding How should I use this medicine? Take this medicine by mouth with a glass of water. Follow the directions on the prescription label. Do not cut, crush or chew this medicine. Take this medicine on an empty stomach, at least 1 hour before or 2 hours after meals. Do not take with food. Take your medicine at regular intervals. Do not take it more often than directed. Do not stop taking except on your doctor's advice. Talk to your pediatrician regarding the use of this medicine in children. Special care may be needed. Overdosage: If you think you have taken too much of this medicine contact a poison control center or emergency room at once. NOTE: This medicine is only for you. Do not share this medicine with others. What if I miss a dose? If you miss a dose, take it as soon as you can. If it is almost time for your next dose, take only that dose. Do not take double or extra doses. What may interact with this medicine? This medicine may interact with the following medications: -carbamazepine -dexamethasone -medicines for seizures like carbamazepine, phenobarbital, phenytoin -neomycin -rifabutin -rifampin -St. John's Wort -warfarin This list may not describe all possible interactions. Give your health care provider a list of all the medicines, herbs, non-prescription drugs, or dietary supplements you use. Also tell them if you smoke, drink alcohol, or use illegal drugs. Some items may interact with your medicine. What should I watch for while using this medicine? This drug may make you feel generally unwell. This is not uncommon, as chemotherapy can affect healthy cells as well as cancer cells. Report any side  effects. Continue your course of treatment even though you feel ill unless your doctor tells you to stop. Men and women should use effective birth control while taking this medicine and for 2 weeks after stopping this medicine. Do not become pregnant while taking this medicine. Women should inform their doctor if they wish to become pregnant or think they might be pregnant. There is a potential for serious side effects to an unborn child. Talk to your health care professional or pharmacist for more information. Do not breast-feed an infant while taking this medicine. If you are going to have surgery or any other procedures, tell your doctor you are taking this medicine. What side effects may I notice from receiving this medicine? Side effects that you should report to your doctor or health care professional as soon as possible: -allergic reactions like skin rash, itching or hives, swelling of the face, lips, or tongue -black, tarry stools -breathing problems -chest pain or chest tightness -dark urine -dizziness -fast or irregular heartbeat -feeling faint or lightheaded -high fever -light-colored stools -nausea, vomiting -redness, blistering, peeling or loosening of the skin, including inside the mouth -right upper belly pain -sores on the hands or feet -spitting up blood or brown material that looks like coffee grounds -stomach pain -yellowing of the eyes or skin Side effects that usually do not require medical attention (report to your doctor or health care professional if they continue or are bothersome): -diarrhea -hair loss -loss of appetite -tiredness -weight loss This list may not describe all possible side effects. Call your doctor for medical advice about side effects. You may report side effects to  FDA at 1-800-FDA-1088. Where should I keep my medicine? Keep out of the reach of children. Store at room temperature between 15 and 30 degrees C (59 and 86 degrees F). Protect from  moisture. Throw away any unused medicine after the expiration date. NOTE: This sheet is a summary. It may not cover all possible information. If you have questions about this medicine, talk to your doctor, pharmacist, or health care provider.  2018 Elsevier/Gold Standard (2015-12-22 21:34:08)  Melatonin oral solid dosage forms What is this medicine? MELATONIN (mel uh TOH nin) is a dietary supplement. It is mostly promoted to help maintain normal sleep patterns. The FDA has not approved this supplement for any medical use. This supplement may be used for other purposes; ask your health care provider or pharmacist if you have questions. This medicine may be used for other purposes; ask your health care provider or pharmacist if you have questions. COMMON BRAND NAME(S): Melatonex What should I tell my health care provider before I take this medicine? They need to know if you have any of these conditions: -cancer -depression or mental illness -diabetes -hormone problems -if you often drink alcohol -immune system problems -liver disease -lung or breathing disease, like asthma -organ transplant -seizure disorder -an unusual or allergic reaction to melatonin, other medicines, foods, dyes, or preservatives -pregnant or trying to get pregnant -breast-feeding How should I use this medicine? Take this supplement by mouth with a glass of water. Do not take with food. This supplement is usually taken 1 or 2 hours before bedtime. After taking this supplement, limit your activities to those needed to prepare for bed. Some products may be chewed or dissolved in the mouth before swallowing. Some tablets or capsules must be swallowed whole; do not cut, crush or chew. Follow the directions on the package labeling, or take as directed by your health care professional. Do not take this supplement more often than directed. Talk to your pediatrician regarding the use of this supplement in children. Special care  may be needed. This supplement is not recommended for use in children without a prescription. Overdosage: If you think you have taken too much of this medicine contact a poison control center or emergency room at once. NOTE: This medicine is only for you. Do not share this medicine with others. What if I miss a dose? If you miss taking your dose at the usual time, skip that dose. If it is almost time for your next dose, take only that dose. Do not take double or extra doses. What may interact with this medicine? Do not take this medicine with any of the following medications: -fluvoxamine -ramelteon -tasimelteon This medicine may also interact with the following medications: -alcohol -caffeine -carbamazepine -certain antibiotics like ciprofloxacin -certain medicines for depression, anxiety, or psychotic disturbances -cimetidine -male hormones, like estrogens and birth control pills, patches, rings, or injections -methoxsalen -nifedipine -other medications for sleep -other herbal or dietary supplements -phenobarbital -rifampin -smoking tobacco -tamoxifen -warfarin This list may not describe all possible interactions. Give your health care provider a list of all the medicines, herbs, non-prescription drugs, or dietary supplements you use. Also tell them if you smoke, drink alcohol, or use illegal drugs. Some items may interact with your medicine. What should I watch for while using this medicine? See your doctor if your symptoms do not get better or if they get worse. Do not take this supplement for more than 2 weeks unless your doctor tells you to. You may get drowsy or  dizzy. Do not drive, use machinery, or do anything that needs mental alertness until you know how this medicine affects you. Do not stand or sit up quickly, especially if you are an older patient. This reduces the risk of dizzy or fainting spells. Alcohol may interfere with the effect of this medicine. Avoid alcoholic  drinks. Talk to your doctor before you use this supplement if you are currently being treated for an emotional, mental, or sleep problem. This medicine may interfere with your treatment. Herbal or dietary supplements are not regulated like medicines. Rigid quality control standards are not required for dietary supplements. The purity and strength of these products can vary. The safety and effect of this dietary supplement for a certain disease or illness is not well known. This product is not intended to diagnose, treat, cure or prevent any disease. The Food and Drug Administration suggests the following to help consumers protect themselves: -Always read product labels and follow directions. -Natural does not mean a product is safe for humans to take. -Look for products that include USP after the ingredient name. This means that the manufacturer followed the standards of the [place] Pharmacopoeia. -Supplements made or sold by a nationally known food or drug company are more likely to be made under tight controls. You can write to the company for more information about how the product was made. What side effects may I notice from receiving this medicine? Side effects that you should report to your doctor or health care professional as soon as possible: -allergic reactions like skin rash, itching or hives, swelling of the face, lips, or tongue -breathing problems -confusion -depressed mood, irritable, or other changes in moods or behaviors -feeling faint or lightheaded, falls -increased blood pressure -irregular or missed menstrual periods -signs and symptoms of liver injury like dark yellow or brown urine; general ill feeling or flu-like symptoms; light-colored stools; loss of appetite; nausea; right upper belly pain; unusually weak or tired; yellowing of the eyes or skin -trouble staying awake or alert during the day -unusual activities while you are still asleep like driving, eating, making phone  calls -unusual bleeding or bruising Side effects that usually do not require medical attention (report to your doctor or health care professional if they continue or are bothersome): -dizziness -drowsiness -headache -hot flashes -nausea -tiredness -unusual dreams or nightmares -upset stomach This list may not describe all possible side effects. Call your doctor for medical advice about side effects. You may report side effects to FDA at 1-800-FDA-1088. Where should I keep my medicine? Keep out of the reach of children. Store at room temperature or as directed on the package label. Protect from moisture. Throw away any unused supplement after the expiration date. NOTE: This sheet is a summary. It may not cover all possible information. If you have questions about this medicine, talk to your doctor, pharmacist, or health care provider.  2018 Elsevier/Gold Standard (2016-08-10 14:38:22)

## 2018-09-14 NOTE — Progress Notes (Signed)
Patient has problems sleeping. Patient asking if he can have something to help him to relax and rest.  Also has exertional SOB.

## 2018-09-15 LAB — MITOCHONDRIAL/SMOOTH MUSCLE AB PNL
F-Actin IgG: 17 Units (ref 0–19)
Mitochondrial M2 Ab, IgG: <20 Units (ref 0.0–20.0)

## 2018-09-15 LAB — ANA W/REFLEX: Anti Nuclear Antibody(ANA): NEGATIVE

## 2018-09-15 LAB — IGG: IgG (Immunoglobin G), Serum: 1603 mg/dL — ABNORMAL HIGH (ref 700–1600)

## 2018-09-15 LAB — ANTI-MICROSOMAL ANTIBODY LIVER / KIDNEY: LKM1 Ab: 1.3 Units (ref 0.0–20.0)

## 2018-09-16 ENCOUNTER — Ambulatory Visit: Payer: Disability Insurance | Admitting: Hematology and Oncology

## 2018-09-17 LAB — HCV RNA QUANT RFLX ULTRA OR GENOTYP

## 2018-09-17 LAB — HEPATITIS C GENOTYPE

## 2018-09-17 LAB — HCV RNA (INTERNATIONAL UNITS)
HCV log10: 7.057 log10 IU/mL
Hcv Rna (International Units): 11400000 IU/mL

## 2018-09-29 ENCOUNTER — Ambulatory Visit: Payer: Medicaid Other | Admitting: Nurse Practitioner

## 2018-10-06 ENCOUNTER — Other Ambulatory Visit: Payer: Self-pay

## 2018-10-06 ENCOUNTER — Emergency Department
Admission: EM | Admit: 2018-10-06 | Discharge: 2018-10-06 | Disposition: A | Discharge status: Home / Self Care | Payer: Medicaid Other | Attending: Emergency Medicine | Admitting: Emergency Medicine

## 2018-10-06 DIAGNOSIS — M47816 Spondylosis without myelopathy or radiculopathy, lumbar region: Secondary | ICD-10-CM

## 2018-10-06 DIAGNOSIS — Z87891 Personal history of nicotine dependence: Secondary | ICD-10-CM | POA: Insufficient documentation

## 2018-10-06 DIAGNOSIS — B1710 Acute hepatitis C without hepatic coma: Secondary | ICD-10-CM | POA: Insufficient documentation

## 2018-10-06 DIAGNOSIS — Z79899 Other long term (current) drug therapy: Secondary | ICD-10-CM | POA: Diagnosis not present

## 2018-10-06 DIAGNOSIS — M47896 Other spondylosis, lumbar region: Secondary | ICD-10-CM | POA: Diagnosis not present

## 2018-10-06 DIAGNOSIS — M545 Low back pain: Secondary | ICD-10-CM | POA: Diagnosis present

## 2018-10-06 MED ORDER — TRAMADOL HCL 50 MG PO TABS: 50.0000 mg | ORAL_TABLET | Freq: Four times a day (QID) | ORAL | 0 refills | Status: AC | PRN

## 2018-10-06 MED ORDER — KETOROLAC TROMETHAMINE 10 MG PO TABS: 10.0000 mg | ORAL_TABLET | Freq: Four times a day (QID) | ORAL | 0 refills | Status: DC | PRN

## 2018-10-06 MED ORDER — KETOROLAC TROMETHAMINE 60 MG/2ML IM SOLN
60.0000 mg | Freq: Once | INTRAMUSCULAR | Status: AC
  Administered 2018-10-06: 60 mg via INTRAMUSCULAR
  Filled 2018-10-06: qty 2

## 2018-10-06 NOTE — Discharge Instructions (Signed)
Your condition requires supervision and treatment by family doctor.

## 2018-10-06 NOTE — ED Provider Notes (Signed)
[facility] Emergency Department Provider Note   ____________________________________________   First MD Initiated Contact with Patient 10/06/18 0932     (approximate)  I have reviewed the triage vital signs and the nursing notes.   HISTORY  Chief Complaint Back Pain    HPI Vernon [name] is a 57 y.o. male patient complain of low back pain for 2 weeks.  Patient has a history of intermittent back pain which was evaluated and diagnosed with arthritis on previous ER visit.  Patient has not followed up with PCP as directed.  Patient denies radicular component to his back pain.  Patient denies bladder bowel dysfunction.  Patient rates pain as a 10/10.  Patient described the pain as "achy".  Patient stated no relief of over-the-counter anti-inflammatory medications.  Past Medical History:  Diagnosis Date  . Gout     Patient Active Problem List   Diagnosis Date Noted  . Hepatitis C virus infection without hepatic coma 08/12/2018  . Hepatocellular carcinoma ([place]) 07/29/2018  . Abnormal iron saturation 07/29/2018    History reviewed. No pertinent surgical history.  Prior to Admission medications   Medication Sig Start Date End Date Taking? Authorizing Provider  allopurinol (ZYLOPRIM) 100 MG tablet Take 1 tablet (100 mg total) by mouth daily. 06/13/18   Mikey College, NP  colchicine 0.6 MG tablet Take 1 tablet (0.6 mg total) by mouth daily. 05/25/18 05/25/19  Sable Feil, PA-C  cyclobenzaprine (FLEXERIL) 10 MG tablet Take 1 tablet (10 mg total) by mouth 3 (three) times daily as needed. 05/25/18   Sable Feil, PA-C  ketorolac (TORADOL) 10 MG tablet Take 1 tablet (10 mg total) by mouth every 6 (six) hours as needed. 10/06/18   Sable Feil, PA-C  traMADol (ULTRAM) 50 MG tablet Take 1 tablet (50 mg total) by mouth every 6 (six) hours as needed. 10/06/18 10/06/19  Sable Feil, PA-C    Allergies Patient has no known allergies.  Family History    Problem Relation Age of Onset  . Cancer Mother   . Cancer Father   . Hypertension Maternal Uncle   . Healthy Daughter   . Healthy Son     Social History Social History   Tobacco Use  . Smoking status: Former Smoker    Years: 18.00    Types: Cigarettes    Last attempt to quit: 08/25/2018    Years since quitting: 0.1  . Smokeless tobacco: Never Used  . Tobacco comment: smokes about 3 cigarretes a month  Substance Use Topics  . Alcohol use: Yes    Comment: occasional beer  . Drug use: Never    Review of Systems Constitutional: No fever/chills Eyes: No visual changes. ENT: No sore throat. Cardiovascular: Denies chest pain. Respiratory: Denies shortness of breath. Gastrointestinal: No abdominal pain.  No nausea, no vomiting.  No diarrhea.  No constipation. Genitourinary: Negative for dysuria. Musculoskeletal: Positive for back pain. Skin: Negative for rash. Neurological: Negative for headaches, focal weakness or numbness. Endocrine:Gout  ____________________________________________   PHYSICAL EXAM:  VITAL SIGNS: ED Triage Vitals [10/06/18 0858]  Enc Vitals Group     BP (!) 153/81     Pulse Rate 88     Resp 17     Temp (!) 97.5 F (36.4 C)     Temp Source Oral     SpO2 98 %     Weight 165 lb (74.8 kg)     Height 5\' 6"  (1.676 m)  Head Circumference      Peak Flow      Pain Score 10     Pain Loc      Pain Edu?      Excl. in [place]?    Constitutional: Alert and oriented. Well appearing and in no acute distress. Cardiovascular: Normal rate, regular rhythm. Grossly normal heart sounds.  Good peripheral circulation.  Elevated systolic blood pressure. Respiratory: Normal respiratory effort.  No retractions. Lungs CTAB. Musculoskeletal: No obvious spinal deformity.  Patient states in [place] reliance on upper extremities.  Patient is moderate guarding palpation of L4-S1.  Patient has negative straight leg test bilaterally in supine position.  Patient is atypical  gait. Neurologic:  Normal speech and language. No gross focal neurologic deficits are appreciated. No gait instability. Skin:  Skin is warm, dry and intact. No rash noted. Psychiatric: Mood and affect are normal. Speech and behavior are normal.  ____________________________________________   LABS (all labs ordered are listed, but only abnormal results are displayed)  Labs Reviewed - No data to display ____________________________________________  EKG   ____________________________________________  RADIOLOGY  ED MD interpretation:    Official radiology report(s): No results found.  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  Patient again advised to establish care with PCP for definitive pain continued evaluation and treatment.      ____________________________________________   FINAL CLINICAL IMPRESSION(S) / ED DIAGNOSES  Final diagnoses:  Osteoarthritis of lumbar spine, unspecified spinal osteoarthritis complication status     ED Discharge Orders         Ordered    ketorolac (TORADOL) 10 MG tablet  Every 6 hours PRN     10/06/18 0938    traMADol (ULTRAM) 50 MG tablet  Every 6 hours PRN     10/06/18 0034           Note:  This document was prepared using Dragon voice recognition software and may include unintentional dictation errors.    Sable Feil, PA-C 10/06/18 [place], [place], MD 10/11/18 1345

## 2018-10-06 NOTE — ED Notes (Signed)
See triage note presents with lower back pain for several days  States pain is non radiating  Ambulates well

## 2018-10-06 NOTE — ED Triage Notes (Signed)
Pt c/o lower back pain across the lower back for the past 2 weeks, states he has intermittent back issue but usually resolves with rest.

## 2018-10-10 ENCOUNTER — Other Ambulatory Visit: Payer: Self-pay

## 2018-10-10 ENCOUNTER — Ambulatory Visit (INDEPENDENT_AMBULATORY_CARE_PROVIDER_SITE_OTHER): Payer: Medicaid Other | Admitting: Nurse Practitioner

## 2018-10-10 ENCOUNTER — Encounter: Payer: Self-pay | Admitting: Nurse Practitioner

## 2018-10-10 VITALS — BP 131/72 | HR 79 | Temp 97.8°F | Ht 66.0 in | Wt 165.0 lb

## 2018-10-10 DIAGNOSIS — M545 Low back pain, unspecified: Secondary | ICD-10-CM

## 2018-10-10 MED ORDER — DICLOFENAC SODIUM 75 MG PO TBEC: 75.0000 mg | DELAYED_RELEASE_TABLET | Freq: Two times a day (BID) | ORAL | 1 refills | Status: DC | PRN

## 2018-10-10 MED ORDER — CYCLOBENZAPRINE HCL 10 MG PO TABS: 10.0000 mg | ORAL_TABLET | Freq: Every day | ORAL | 1 refills | Status: DC
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
## 2018-10-10 NOTE — Patient Instructions (Addendum)
Conni Slipper,   Thank you for coming in to clinic today.  1. You have a low back arthritis. - Start taking diclofenac 75 mg one tablet twice daily for 14 days.  Then take twice daily as needed for moderate back pain after this. - May also Tylenol extra strength 1 to 2 tablets every 6-8 hours for aches or fever/chills for next few days as needed.  Do not take more than 3,000 mg in 24 hours from all medicines.  Do not take ibuprofen, Aleeve, aspirin at this time.  - Use heat and ice.  Apply this for 15 minutes at a time 6-8 times per day.   - Muscle rub with lidocaine, lidocaine patch, Biofreeze, or tiger balm for topical pain relief.  Avoid using this with heat and ice to avoid burns. - START muscle relaxer cyclobenzaprine (Flexeril) 10 mg one tablet at bedtime.  This can cause drowsiness, so use caution.  It may be best to only take this at night for helping you during sleep. - May use a small tennis ball, racquet ball, or other soft ball for self massage. Use against a wall to help put pressure and release your muscle soreness. - START exercises while lying in bed.   2. Let us know if you want to start physical therapy or see an orthopedic doctor for additional treatment.  Please schedule a follow-up appointment with Cassell Smiles, AGNP. Return in about 3 months (around 01/10/2019).  If you have any other questions or concerns, please feel free to call the clinic or send a message through [place]. You may also schedule an earlier appointment if necessary.  You will receive a survey after today's visit either digitally by e-mail or paper by [employer]. Your experiences and feedback matter to [place].  Please respond so we know how we are doing as we provide care for you.   Cassell Smiles, DNP, AGNP-BC Adult Gerontology Nurse Practitioner [facility]  Low Back Pain Exercises See other page with pictures of each exercise.  Start with 1 or 2 of these exercises that you are  most comfortable with. Do not do any exercises that cause you significant worsening pain. Some of these may cause some "stretching soreness" but it should go away after you stop the exercise, and get better over time. Gradually increase up to 3-4 exercises as tolerated.  Standing hamstring stretch: Place the heel of your leg on a stool about 15 inches high. Keep your knee straight. Lean forward, bending at the hips until you feel a mild stretch in the back of your thigh. Make sure you do not roll your shoulders and bend at the waist when doing this or you will stretch your lower back instead. Hold the stretch for 15 to 30 seconds. Repeat 3 times. Repeat the same stretch on your other leg.  Cat and camel: Get down on your hands and knees. Let your stomach sag, allowing your back to curve downward. Hold this position for 5 seconds. Then arch your back and hold for 5 seconds. Do 3 sets of 10.  Quadriped Arm/Leg Raises: Get down on your hands and knees. Tighten your abdominal muscles to stiffen your spine. While keeping your abdominals tight, raise one arm and the opposite leg away from you. Hold this position for 5 seconds. Lower your arm and leg slowly and alternate sides. Do this 10 times on each side.  Pelvic tilt: Lie on your back with your knees bent and  your feet flat on the floor. Tighten your abdominal muscles and push your lower back into the floor. Hold this position for 5 seconds, then relax. Do 3 sets of 10.  Partial curl: Lie on your back with your knees bent and your feet flat on the floor. Tighten your stomach muscles and flatten your back against the floor. Tuck your chin to your chest. With your hands stretched out in front of you, curl your upper body forward until your shoulders clear the floor. Hold this position for 3 seconds. Don't hold your breath. It helps to breathe out as you lift your shoulders up. Relax. Repeat 10 times. Build to 3 sets of 10. To challenge yourself, clasp your  hands behind your head and keep your elbows out to the side.  Lower trunk rotation: Lie on your back with your knees bent and your feet flat on the floor. Tighten your abdominal muscles and push your lower back into the floor. Keeping your shoulders down flat, gently rotate your legs to one side, then the other as far as you can. Repeat 10 to 20 times.  Single knee to chest stretch: Lie on your back with your legs straight out in front of you. Bring one knee up to your chest and grasp the back of your thigh. Pull your knee toward your chest, stretching your buttock muscle. Hold this position for 15 to 30 seconds and return to the starting position. Repeat 3 times on each side.  Double knee to chest: Lie on your back with your knees bent and your feet flat on the floor. Tighten your abdominal muscles and push your lower back into the floor. Pull both knees up to your chest. Hold for 5 seconds and repeat 10 to 20 times.

## 2018-10-11 DIAGNOSIS — Z7189 Other specified counseling: Secondary | ICD-10-CM | POA: Insufficient documentation

## 2018-10-11 DIAGNOSIS — K759 Inflammatory liver disease, unspecified: Secondary | ICD-10-CM | POA: Insufficient documentation

## 2018-10-17 ENCOUNTER — Encounter: Payer: Self-pay | Admitting: Nurse Practitioner

## 2018-10-18 ENCOUNTER — Other Ambulatory Visit: Payer: Self-pay | Admitting: Hematology and Oncology

## 2018-10-18 MED ORDER — LENVATINIB (12 MG DAILY DOSE) 3 X 4 MG PO CPPK: 12.0000 mg | ORAL_CAPSULE | Freq: Every day | ORAL | 1 refills | Status: DC

## 2018-10-19 ENCOUNTER — Telehealth: Payer: Self-pay | Admitting: Pharmacist

## 2018-10-19 DIAGNOSIS — C220 Liver cell carcinoma: Secondary | ICD-10-CM

## 2018-10-19 NOTE — Telephone Encounter (Signed)
Oral Oncology Pharmacist Encounter  Received new prescription for Lenvima (lenvatinib) for the treatment of [place], planned duration until disease progression or unacceptable drug toxicity.  Recommend checking LFTs, SCr/CrCl, electrolytes, and UA for proteinuria prior to the start of [place]. BP from 10/10/18 well controlled. Prescription dose and frequency assessed.   Current medication list in Epic reviewed, no DDIs with lenvatinib identified.  Prescription has been e-scribed to the [facility] for benefits analysis and approval.  Oral Oncology Clinic will continue to follow for insurance authorization, copayment issues, initial counseling and start date.  Darl Pikes, PharmD, BCPS, [facility] Hematology/Oncology Clinical Pharmacist [facility]/AP Oral [facility] [phone]  10/19/2018 4:20 PM

## 2018-10-20 ENCOUNTER — Telehealth: Payer: Self-pay | Admitting: Pharmacy Technician

## 2018-10-20 MED ORDER — LENVATINIB (12 MG DAILY DOSE) 3 X 4 MG PO CPPK: 12.0000 mg | ORAL_CAPSULE | Freq: Every day | ORAL | 1 refills | Status: DC

## 2018-10-20 NOTE — Telephone Encounter (Signed)
Oral Oncology Patient Advocate Encounter  After completing a benefits investigation, prior authorization for Vernon [name] is not required for Medicaid.  Copay is $3.00  [place] Patient [place] Phone [phone] Fax [phone] 10/20/2018 11:25 AM

## 2018-11-07 NOTE — Telephone Encounter (Signed)
Oral Chemotherapy Pharmacist Encounter   Followed up with medonc team in reference to this patient. Honor Loh NP stated that the patient no showed and the [facility] plan was on hold for now.  Darl Pikes, PharmD, BCPS, [facility] Hematology/Oncology Clinical Pharmacist [facility]/AP Oral [facility] [phone]  11/07/2018 3:55 PM

## 2018-11-10 ENCOUNTER — Telehealth: Payer: Self-pay | Admitting: Nurse Practitioner

## 2018-11-10 NOTE — Telephone Encounter (Signed)
Pt called states that he was think appt was suppose to be set up with a liver dr. Abbott Pao. Cal back # is  [phone]

## 2019-01-04 ENCOUNTER — Emergency Department
Admission: EM | Admit: 2019-01-04 | Discharge: 2019-01-04 | Disposition: A | Discharge status: Home / Self Care | Payer: Medicaid Other | Attending: Emergency Medicine | Admitting: Emergency Medicine

## 2019-01-04 ENCOUNTER — Encounter: Payer: Self-pay | Admitting: Emergency Medicine

## 2019-01-04 ENCOUNTER — Emergency Department: Payer: Medicaid Other

## 2019-01-04 ENCOUNTER — Other Ambulatory Visit: Payer: Self-pay

## 2019-01-04 DIAGNOSIS — Z87891 Personal history of nicotine dependence: Secondary | ICD-10-CM | POA: Insufficient documentation

## 2019-01-04 DIAGNOSIS — R42 Dizziness and giddiness: Secondary | ICD-10-CM | POA: Diagnosis present

## 2019-01-04 LAB — CBC
HCT: 41.5 % (ref 39.0–52.0)
HEMOGLOBIN: 13.4 g/dL (ref 13.0–17.0)
MCH: 32.4 pg (ref 26.0–34.0)
MCHC: 32.3 g/dL (ref 30.0–36.0)
MCV: 100.2 fL — AB (ref 80.0–100.0)
PLATELETS: 116 10*3/uL — AB (ref 150–400)
RBC: 4.14 MIL/uL — ABNORMAL LOW (ref 4.22–5.81)
RDW: 12.3 % (ref 11.5–15.5)
WBC: 6.1 10*3/uL (ref 4.0–10.5)
nRBC: 0 % (ref 0.0–0.2)

## 2019-01-04 LAB — DIFFERENTIAL
ABS IMMATURE GRANULOCYTES: 0.02 10*3/uL (ref 0.00–0.07)
Basophils Absolute: 0 10*3/uL (ref 0.0–0.1)
Basophils Relative: 1 %
EOS ABS: 0.1 10*3/uL (ref 0.0–0.5)
Eosinophils Relative: 2 %
Immature Granulocytes: 0 %
LYMPHS ABS: 1.5 10*3/uL (ref 0.7–4.0)
LYMPHS PCT: 25 %
MONOS PCT: 9 %
Monocytes Absolute: 0.5 10*3/uL (ref 0.1–1.0)
NEUTROS ABS: 3.8 10*3/uL (ref 1.7–7.7)
Neutrophils Relative %: 63 %

## 2019-01-04 LAB — COMPREHENSIVE METABOLIC PANEL
ALK PHOS: 95 U/L (ref 38–126)
ALT: 147 U/L — AB (ref 0–44)
AST: 195 U/L — AB (ref 15–41)
Albumin: 4 g/dL (ref 3.5–5.0)
Anion gap: 9 (ref 5–15)
BILIRUBIN TOTAL: 1.5 mg/dL — AB (ref 0.3–1.2)
BUN: 16 mg/dL (ref 6–20)
CALCIUM: 9.1 mg/dL (ref 8.9–10.3)
CO2: 25 mmol/L (ref 22–32)
CREATININE: 1.08 mg/dL (ref 0.61–1.24)
Chloride: 99 mmol/L (ref 98–111)
GFR calc Af Amer: >60 mL/min (ref >60)
Glucose, Bld: 125 mg/dL — ABNORMAL HIGH (ref 70–99)
Potassium: 3.9 mmol/L (ref 3.5–5.1)
Sodium: 133 mmol/L — ABNORMAL LOW (ref 135–145)
TOTAL PROTEIN: 8.1 g/dL (ref 6.5–8.1)

## 2019-01-04 LAB — TROPONIN I

## 2019-01-04 LAB — GLUCOSE, CAPILLARY: Glucose-Capillary: 116 mg/dL — ABNORMAL HIGH (ref 70–99)

## 2019-01-04 NOTE — ED Provider Notes (Signed)
[facility] Emergency Department Provider Note  Time seen: 12:05 PM  I have reviewed the triage vital signs and the nursing notes.   HISTORY  Chief Complaint Dizziness    HPI Vernon [name] is a 58 y.o. male with a past medical history of hepatitis, presents to the emergency department for lightheadedness/dizziness today.  According to the patient he awoke this morning feeling somewhat lightheaded and dizzy.  States he was concerned so he thought he better come to the emergency department just to get checked out per patient.  Denies any chest pain, shortness of breath, recent illnesses, cough congestion or fever, nausea vomiting or diarrhea, chest or abdominal pain.  Overall the patient appears very well, states he feels well currently denies any dizziness or lightheadedness at this time.   Past Medical History:  Diagnosis Date  . Gout     Patient Active Problem List   Diagnosis Date Noted  . Goals of care, counseling/discussion 10/11/2018  . Hepatitis 10/11/2018  . Hepatitis C virus infection without hepatic coma 08/12/2018  . Hepatocellular carcinoma ([place]) 07/29/2018  . Abnormal iron saturation 07/29/2018    History reviewed. No pertinent surgical history.  Prior to Admission medications   Medication Sig Start Date End Date Taking? Authorizing Provider  allopurinol (ZYLOPRIM) 100 MG tablet Take 1 tablet (100 mg total) by mouth daily. 06/13/18   Mikey College, NP  cyclobenzaprine (FLEXERIL) 10 MG tablet Take 1 tablet (10 mg total) by mouth at bedtime. 10/10/18   Mikey College, NP  diclofenac (VOLTAREN) 75 MG EC tablet Take 1 tablet (75 mg total) by mouth 2 (two) times daily as needed for moderate pain (back pain). 10/10/18   Mikey College, NP  ketorolac (TORADOL) 10 MG tablet Take 1 tablet (10 mg total) by mouth every 6 (six) hours as needed. Patient not taking: Reported on 10/10/2018 10/06/18   Sable Feil, PA-C  Lenvatinib 12  mg daily dose (LENVIMA) 3 x 4 MG capsule Take 12 mg by mouth daily. 10/20/18   Lequita Asal, MD  traMADol (ULTRAM) 50 MG tablet Take 1 tablet (50 mg total) by mouth every 6 (six) hours as needed. 10/06/18 10/06/19  Sable Feil, PA-C    No Known Allergies  Family History  Problem Relation Age of Onset  . Cancer Mother   . Cancer Father   . Hypertension Maternal Uncle   . Healthy Daughter   . Healthy Son     Social History Social History   Tobacco Use  . Smoking status: Former Smoker    Years: 18.00    Types: Cigarettes    Last attempt to quit: 08/25/2018    Years since quitting: 0.3  . Smokeless tobacco: Never Used  . Tobacco comment: smokes about 3 cigarretes a month  Substance Use Topics  . Alcohol use: Yes    Comment: occasional beer  . Drug use: Never    Review of Systems Constitutional: Negative for fever.  Also for dizziness/lightheadedness this morning, now largely resolved. ENT: Negative for recent illness/congestion Cardiovascular: Negative for chest pain. Respiratory: Negative for shortness of breath. Gastrointestinal: Negative for abdominal pain, vomiting and diarrhea. Genitourinary: Negative for urinary compaints Musculoskeletal: Negative for musculoskeletal complaints Skin: Negative for skin complaints  Neurological: Negative for headache All other ROS negative  ____________________________________________   PHYSICAL EXAM:  VITAL SIGNS: ED Triage Vitals  Enc Vitals Group     BP 01/04/19 1022 114/75     Pulse Rate 01/04/19  1022 96     Resp 01/04/19 1022 16     Temp 01/04/19 1022 98.5 F (36.9 C)     Temp Source 01/04/19 1022 Oral     SpO2 01/04/19 1022 97 %     Weight 01/04/19 1023 165 lb (74.8 kg)     Height 01/04/19 1023 5\' 7"  (1.702 m)     Head Circumference --      Peak Flow --      Pain Score 01/04/19 1023 0     Pain Loc --      Pain Edu? --      Excl. in [place]? --    Constitutional: Alert and oriented. Well appearing and in no  distress. Eyes: Normal exam ENT   Head: Normocephalic and atraumatic   Mouth/Throat: Mucous membranes are moist. Cardiovascular: Normal rate, regular rhythm. No murmur Respiratory: Normal respiratory effort without tachypnea nor retractions. Breath sounds are clear  Gastrointestinal: Soft and nontender. No distention.  Musculoskeletal: Nontender with normal range of motion in all extremities.  Neurologic:  Normal speech and language. No gross focal neurologic deficits Skin:  Skin is warm, dry and intact.  Psychiatric: Mood and affect are normal. Speech and behavior are normal.   ____________________________________________    EKG  EKG viewed and interpreted by myself shows a normal sinus rhythm 87 bpm with a narrow QRS, normal axis, normal intervals, no concerning ST changes.  ____________________________________________    RADIOLOGY  CT head negative for acute abnormality.  ____________________________________________   INITIAL IMPRESSION / ASSESSMENT AND PLAN / ED COURSE  Pertinent labs & imaging results that were available during my care of the patient were reviewed by me and considered in my medical decision making (see chart for details).  Patient presents to the emergency department for dizziness/lightheadedness since awakening this morning.  Differential at this time would include vertigo, metabolic abnormality, dehydration, ACS, less likely CVA/ICH/mass/tumor.  Patient's work-up is reassuring, lab work is largely at baseline with moderate LFT elevation.  Patient is troponin is negative, kidney function is normal.  EKG is reassuring, CT scan of the head is normal.  Overall the patient appears very well, admits that he does feel much better but wanted to come to get checked out.  I discussed with the patient taking it easy, lots of rest, plenty of fluids and having the patient follow-up with his primary care doctor.  I also discussed return precautions.  Patient  agreeable to plan of care.  ____________________________________________   FINAL CLINICAL IMPRESSION(S) / ED DIAGNOSES  Dizziness   Harvest Dark, MD 01/04/19 1207

## 2019-01-04 NOTE — ED Triage Notes (Signed)
Patient states he woke up this morning like normal. Started having dizziness and felt like he was going to pass out. Reports he is still feeling dizzy and "not right". Patient alert and oriented in triage. Denies headache.

## 2019-01-10 ENCOUNTER — Ambulatory Visit: Payer: Medicaid Other | Admitting: Nurse Practitioner

## 2019-01-13 ENCOUNTER — Ambulatory Visit: Payer: Medicaid Other | Admitting: Nurse Practitioner

## 2019-06-03 ENCOUNTER — Other Ambulatory Visit: Payer: Self-pay | Admitting: Nurse Practitioner

## 2019-06-03 DIAGNOSIS — M109 Gout, unspecified: Secondary | ICD-10-CM

## 2019-07-05 ENCOUNTER — Other Ambulatory Visit: Payer: Self-pay | Admitting: Nurse Practitioner

## 2019-07-05 DIAGNOSIS — M109 Gout, unspecified: Secondary | ICD-10-CM

## 2019-08-06 NOTE — Progress Notes (Signed)
Patient no showed for this appt on 08/12/2018 - MD note deleted to close encounter

## 2020-01-01 DEATH — deceased

## 2020-04-19 ENCOUNTER — Other Ambulatory Visit: Payer: Self-pay | Admitting: Nurse Practitioner

## 2020-04-22 ENCOUNTER — Other Ambulatory Visit: Payer: Self-pay | Admitting: Nurse Practitioner

## 2020-04-22 DIAGNOSIS — C220 Liver cell carcinoma: Secondary | ICD-10-CM

## 2020-04-24 ENCOUNTER — Ambulatory Visit
Admission: RE | Admit: 2020-04-24 | Discharge: 2020-04-24 | Disposition: A | Discharge status: Home / Self Care | Payer: Disability Insurance | Source: Ambulatory Visit | Attending: Nurse Practitioner | Admitting: Nurse Practitioner

## 2020-04-24 ENCOUNTER — Encounter (HOSPITAL_COMMUNITY): Payer: Self-pay | Admitting: *Deleted

## 2020-04-24 ENCOUNTER — Inpatient Hospital Stay (HOSPITAL_COMMUNITY)
Admission: EM | Admit: 2020-04-24 | Discharge: 2020-04-26 | DRG: 175 | Disposition: A | Discharge status: Home / Self Care | Payer: Medicaid Other | Attending: Internal Medicine | Admitting: Internal Medicine

## 2020-04-24 ENCOUNTER — Other Ambulatory Visit: Payer: Self-pay

## 2020-04-24 DIAGNOSIS — Z79899 Other long term (current) drug therapy: Secondary | ICD-10-CM

## 2020-04-24 DIAGNOSIS — M109 Gout, unspecified: Secondary | ICD-10-CM | POA: Diagnosis present

## 2020-04-24 DIAGNOSIS — K769 Liver disease, unspecified: Secondary | ICD-10-CM

## 2020-04-24 DIAGNOSIS — M25551 Pain in right hip: Secondary | ICD-10-CM | POA: Diagnosis present

## 2020-04-24 DIAGNOSIS — D649 Anemia, unspecified: Secondary | ICD-10-CM

## 2020-04-24 DIAGNOSIS — Z809 Family history of malignant neoplasm, unspecified: Secondary | ICD-10-CM

## 2020-04-24 DIAGNOSIS — C220 Liver cell carcinoma: Secondary | ICD-10-CM | POA: Diagnosis present

## 2020-04-24 DIAGNOSIS — D509 Iron deficiency anemia, unspecified: Secondary | ICD-10-CM | POA: Diagnosis present

## 2020-04-24 DIAGNOSIS — R5383 Other fatigue: Secondary | ICD-10-CM | POA: Diagnosis present

## 2020-04-24 DIAGNOSIS — I2699 Other pulmonary embolism without acute cor pulmonale: Principal | ICD-10-CM | POA: Diagnosis present

## 2020-04-24 DIAGNOSIS — G893 Neoplasm related pain (acute) (chronic): Secondary | ICD-10-CM | POA: Diagnosis present

## 2020-04-24 DIAGNOSIS — C7951 Secondary malignant neoplasm of bone: Secondary | ICD-10-CM | POA: Diagnosis present

## 2020-04-24 DIAGNOSIS — Z20822 Contact with and (suspected) exposure to covid-19: Secondary | ICD-10-CM | POA: Diagnosis present

## 2020-04-24 DIAGNOSIS — I2510 Atherosclerotic heart disease of native coronary artery without angina pectoris: Secondary | ICD-10-CM | POA: Diagnosis present

## 2020-04-24 DIAGNOSIS — Z8249 Family history of ischemic heart disease and other diseases of the circulatory system: Secondary | ICD-10-CM

## 2020-04-24 DIAGNOSIS — Z87891 Personal history of nicotine dependence: Secondary | ICD-10-CM

## 2020-04-24 DIAGNOSIS — I81 Portal vein thrombosis: Secondary | ICD-10-CM | POA: Diagnosis present

## 2020-04-24 DIAGNOSIS — Z515 Encounter for palliative care: Secondary | ICD-10-CM | POA: Diagnosis present

## 2020-04-24 DIAGNOSIS — K5900 Constipation, unspecified: Secondary | ICD-10-CM | POA: Diagnosis present

## 2020-04-24 DIAGNOSIS — Z791 Long term (current) use of non-steroidal anti-inflammatories (NSAID): Secondary | ICD-10-CM

## 2020-04-24 DIAGNOSIS — B1920 Unspecified viral hepatitis C without hepatic coma: Secondary | ICD-10-CM | POA: Diagnosis present

## 2020-04-24 DIAGNOSIS — M546 Pain in thoracic spine: Secondary | ICD-10-CM | POA: Diagnosis present

## 2020-04-24 DIAGNOSIS — M79651 Pain in right thigh: Secondary | ICD-10-CM | POA: Diagnosis present

## 2020-04-24 HISTORY — DX: Liver cell carcinoma: C22.0

## 2020-04-24 HISTORY — DX: Unspecified viral hepatitis C without hepatic coma: B19.20

## 2020-04-24 LAB — BASIC METABOLIC PANEL
Anion gap: 9 (ref 5–15)
BUN: 11 mg/dL (ref 8–23)
CO2: 22 mmol/L (ref 22–32)
Calcium: 9.1 mg/dL (ref 8.9–10.3)
Chloride: 103 mmol/L (ref 98–111)
Creatinine, Ser: 1.24 mg/dL (ref 0.61–1.24)
GFR calc Af Amer: >60 mL/min (ref >60)
GFR calc non Af Amer: >60 mL/min (ref >60)
Glucose, Bld: 85 mg/dL (ref 70–99)
Potassium: 4.3 mmol/L (ref 3.5–5.1)
Sodium: 134 mmol/L — ABNORMAL LOW (ref 135–145)

## 2020-04-24 LAB — HEPATIC FUNCTION PANEL
ALT: 81 U/L — ABNORMAL HIGH (ref 0–44)
AST: 103 U/L — ABNORMAL HIGH (ref 15–41)
Albumin: 3.2 g/dL — ABNORMAL LOW (ref 3.5–5.0)
Alkaline Phosphatase: 205 U/L — ABNORMAL HIGH (ref 38–126)
Bilirubin, Direct: 0.4 mg/dL — ABNORMAL HIGH (ref 0.0–0.2)
Indirect Bilirubin: 0.9 mg/dL (ref 0.3–0.9)
Total Bilirubin: 1.3 mg/dL — ABNORMAL HIGH (ref 0.3–1.2)
Total Protein: 7.2 g/dL (ref 6.5–8.1)

## 2020-04-24 LAB — CBC
HCT: 28.7 % — ABNORMAL LOW (ref 39.0–52.0)
Hemoglobin: 8.7 g/dL — ABNORMAL LOW (ref 13.0–17.0)
MCH: 27.1 pg (ref 26.0–34.0)
MCHC: 30.3 g/dL (ref 30.0–36.0)
MCV: 89.4 fL (ref 80.0–100.0)
Platelets: 293 10*3/uL (ref 150–400)
RBC: 3.21 MIL/uL — ABNORMAL LOW (ref 4.22–5.81)
RDW: 14.7 % (ref 11.5–15.5)
WBC: 6.5 10*3/uL (ref 4.0–10.5)
nRBC: 0 % (ref 0.0–0.2)

## 2020-04-24 LAB — POC OCCULT BLOOD, ED: Fecal Occult Bld: NEGATIVE

## 2020-04-24 LAB — PROTIME-INR
INR: 1 (ref 0.8–1.2)
Prothrombin Time: 13 seconds (ref 11.4–15.2)

## 2020-04-24 MED ORDER — HEPARIN BOLUS VIA INFUSION
4500.0000 [IU] | Freq: Once | INTRAVENOUS | Status: AC
  Administered 2020-04-24: 4500 [IU] via INTRAVENOUS
  Filled 2020-04-24: qty 4500

## 2020-04-24 MED ORDER — IOPAMIDOL (ISOVUE-300) INJECTION 61%
100.0000 mL | Freq: Once | INTRAVENOUS | Status: AC | PRN
  Administered 2020-04-24: 100 mL via INTRAVENOUS

## 2020-04-24 MED ORDER — HEPARIN (PORCINE) 25000 UT/250ML-% IV SOLN
850.0000 [IU]/h | INTRAVENOUS | Status: DC
  Administered 2020-04-24: 1200 [IU]/h via INTRAVENOUS
  Filled 2020-04-24 (×2): qty 250

## 2020-04-24 MED ORDER — HEPARIN (PORCINE) 25000 UT/250ML-% IV SOLN: 10.0000 [IU]/kg/h | INTRAVENOUS | Status: DC

## 2020-04-24 MED ORDER — HEPARIN SODIUM (PORCINE) 5000 UNIT/ML IJ SOLN: 60.0000 [IU]/kg | Freq: Once | INTRAMUSCULAR | Status: DC

## 2020-04-24 MED ORDER — SODIUM CHLORIDE 0.9% FLUSH: 3.0000 mL | Freq: Once | INTRAVENOUS | Status: DC

## 2020-04-24 NOTE — Progress Notes (Signed)
ANTICOAGULATION CONSULT NOTE - Initial Consult  Pharmacy Consult for heparin Indication: pulmonary embolus  No Known Allergies  Patient Measurements: Height: 5\' 6"  (167.6 cm) Weight: 74.8 kg (165 lb) IBW/kg (Calculated) : 63.8 Heparin Dosing Weight: 74.8kg  Vital Signs: Temp: 98.8 F (37.1 C) (05/26 1854) Temp Source: Oral (05/26 1854) BP: 144/83 (05/26 1854) Pulse Rate: 97 (05/26 1854)  Labs: Recent Labs    04/24/20 1945  HGB 8.7*  HCT 28.7*  PLT 293  CREATININE 1.24    Estimated Creatinine Clearance: 56.5 mL/min (by C-G formula based on SCr of 1.24 mg/dL).   Medical History: Past Medical History:  Diagnosis Date  . Gout     Medications:  Infusions:  . heparin      Assessment: 60 yom presented to the ED with acute PE. To start IV heparin for anticoagulation. Baseline Hgb is low at 8.7 and platelets are WNL. He is no on anticoagulation PTA.   Goal of Therapy:  Heparin level 0.3-0.7 units/ml Monitor platelets by anticoagulation protocol: Yes   Plan:  Heparin bolus 4500 units IV x 1 Heparin gtt 1200 units/hr Check a 6 hr heparin level Daily heparin level and CBC  Vernon [name], Rande Lawman 04/24/2020,10:08 PM

## 2020-04-24 NOTE — ED Provider Notes (Signed)
[facility] EMERGENCY DEPARTMENT Provider Note   CSN: [id number] Arrival date & time: 04/24/20  1829     History Chief Complaint  Patient presents with  . Abnormal Test    Vernon [name] is a 59 y.o. male past no history of hepatitis, hepatocellular carcinoma who presents for evaluation of pulmonary embolism seen on CT scan.  Patient reports that he had a scan today that he was told was abnormal was told to come to emergency department for further evaluation.  He states that his primary care doctor ordered the scans after he had been having some pain in his right hip and right leg.  He apparently also had had some decreased appetite and just overall feeling poorly.  He was told that the scans today were positive for PE and that he needed to go to the emergency department.  States he has not had any shortness of breath or chest pain.  He states that the only pain that he is really had has been in the right hip goes down into his right leg.  No preceding trauma, injury.  He has not noticed any swelling, warmth, erythema of his bilateral lower extremities.  Patient states that he had a history of some liver abnormalities back in 2019.  He had been seen by the cancer center at [facility].  He states that he thought that they were due to hepatitis but he never got a clear answer.  They discussed about sending him to a liver specialist at [facility] but patient states he was never contacted and he never followed up with anybody.  He does report that he used to drink alcohol.  He states that he is for the most part quit but did report a small binge a few weeks ago.  He has not had any fevers, chest pain, difficulty breathing, vomiting, nausea/vomiting, numbness/weakness of his arms or legs he denies any testosterone use, recent surgeries, admissions, long trips, leg swelling, history of PEs or DVTs.  The history is provided by the patient.       Past Medical History:  Diagnosis Date   . Gout     Patient Active Problem List   Diagnosis Date Noted  . Goals of care, counseling/discussion 10/11/2018  . Hepatitis 10/11/2018  . Hepatitis C virus infection without hepatic coma 08/12/2018  . Hepatocellular carcinoma ([place]) 07/29/2018  . Abnormal iron saturation 07/29/2018    History reviewed. No pertinent surgical history.     Family History  Problem Relation Age of Onset  . Cancer Mother   . Cancer Father   . Hypertension Maternal Uncle   . Healthy Daughter   . Healthy Son     Social History   Tobacco Use  . Smoking status: Former Smoker    Years: 18.00    Types: Cigarettes    Quit date: 08/25/2018    Years since quitting: 1.6  . Smokeless tobacco: Never Used  . Tobacco comment: smokes about 3 cigarretes a month  Substance Use Topics  . Alcohol use: Yes    Comment: occasional beer  . Drug use: Never    Home Medications Prior to Admission medications   Medication Sig Start Date End Date Taking? Authorizing Provider  meloxicam (MOBIC) 7.5 MG tablet Take 7.5 mg by mouth 2 (two) times daily. 02/28/20  Yes [provider]  allopurinol (ZYLOPRIM) 100 MG tablet Take 1 tablet (100 mg total) by mouth daily. Patient not taking: Reported on 04/24/2020 06/13/18  Mikey College, NP  cyclobenzaprine (FLEXERIL) 10 MG tablet Take 1 tablet (10 mg total) by mouth at bedtime. Patient not taking: Reported on 04/24/2020 10/10/18   Mikey College, NP  diclofenac (VOLTAREN) 75 MG EC tablet Take 1 tablet (75 mg total) by mouth 2 (two) times daily as needed for moderate pain (back pain). Patient not taking: Reported on 04/24/2020 10/10/18   Mikey College, NP  ketorolac (TORADOL) 10 MG tablet Take 1 tablet (10 mg total) by mouth every 6 (six) hours as needed. Patient not taking: Reported on 10/10/2018 10/06/18   Sable Feil, PA-C  Lenvatinib 12 mg daily dose (LENVIMA) 3 x 4 MG capsule Take 12 mg by mouth daily. Patient not taking: Reported on  04/24/2020 10/20/18   Lequita Asal, MD    Allergies    Patient has no known allergies.  Review of Systems   Review of Systems  Constitutional: Negative for fever.  Respiratory: Negative for cough and shortness of breath.   Cardiovascular: Negative for chest pain.  Gastrointestinal: Negative for abdominal pain, nausea and vomiting.  Genitourinary: Negative for dysuria and hematuria.  Musculoskeletal:       Right hip pain  Neurological: Negative for weakness, numbness and headaches.  All other systems reviewed and are negative.   Physical Exam Updated Vital Signs BP (!) 145/84   Pulse 97   Temp 98.8 F (37.1 C) (Oral)   Resp 17   Ht 5\' 6"  (1.676 m)   Wt 74.8 kg   SpO2 97%   BMI 26.63 kg/m   Physical Exam Vitals and nursing note reviewed.  Constitutional:      Appearance: Normal appearance. He is well-developed.  HENT:     Head: Normocephalic and atraumatic.  Eyes:     General: Lids are normal.     Conjunctiva/sclera: Conjunctivae normal.     Pupils: Pupils are equal, round, and reactive to light.  Cardiovascular:     Rate and Rhythm: Normal rate and regular rhythm.     Pulses: Normal pulses.     Heart sounds: Normal heart sounds. No murmur. No friction rub. No gallop.   Pulmonary:     Effort: Pulmonary effort is normal.     Breath sounds: Normal breath sounds.     Comments: Lungs clear to auscultation bilaterally.  Symmetric chest rise.  No wheezing, rales, rhonchi. Abdominal:     Palpations: Abdomen is soft. Abdomen is not rigid.     Tenderness: There is no abdominal tenderness. There is no guarding.     Comments: Abdomen is soft, non-distended, non-tender. No rigidity, No guarding. No peritoneal signs.  Musculoskeletal:        General: Normal range of motion.     Cervical back: Full passive range of motion without pain.     Comments: Bilateral lower extremities are symmetric in appearance without any overlying warmth, erythema.  Skin:    General: Skin  is warm and dry.     Capillary Refill: Capillary refill takes less than 2 seconds.  Neurological:     Mental Status: He is alert and oriented to person, place, and time.  Psychiatric:        Speech: Speech normal.     ED Results / Procedures / Treatments   Labs (all labs ordered are listed, but only abnormal results are displayed) Labs Reviewed  BASIC METABOLIC PANEL - Abnormal; Notable for the following components:      Result Value   Sodium 134 (*)  All other components within normal limits  CBC - Abnormal; Notable for the following components:   RBC 3.21 (*)    Hemoglobin 8.7 (*)    HCT 28.7 (*)    All other components within normal limits  HEPATIC FUNCTION PANEL - Abnormal; Notable for the following components:   Albumin 3.2 (*)    AST 103 (*)    ALT 81 (*)    Alkaline Phosphatase 205 (*)    Total Bilirubin 1.3 (*)    Bilirubin, Direct 0.4 (*)    All other components within normal limits  SARS CORONAVIRUS 2 BY RT PCR (HOSPITAL ORDER, [facility])  PROTIME-INR  HEPARIN LEVEL (UNFRACTIONATED)  CBC  POC OCCULT BLOOD, ED    EKG EKG Interpretation  Date/Time:  Wednesday Apr 24 2020 19:27:06 EDT Ventricular Rate:  91 PR Interval:  140 QRS Duration: 78 QT Interval:  342 QTC Calculation: 420 R Axis:   19 Text Interpretation: Normal sinus rhythm Possible Anterior infarct , age undetermined Abnormal ECG Confirmed by Fredia Sorrow [phone]) on 04/24/2020 9:07:31 PM   Radiology CT CHEST W CONTRAST  Result Date: 04/24/2020 CLINICAL DATA:  Hepatocellular carcinoma, restaging. EXAM: CT CHEST WITH CONTRAST CT ABDOMEN WITH AND WITHOUT CONTRAST TECHNIQUE: Multidetector CT imaging of the chest was performed during intravenous contrast administration. Multidetector CT imaging of the abdomen was performed following the standard protocol before and during bolus administration of intravenous contrast. CONTRAST:  187mL ISOVUE-300 IOPAMIDOL (ISOVUE-300)  INJECTION 61% COMPARISON:  PET of 08/11/2018. Abdominal MRI of 07/15/2018. No prior chest CT. FINDINGS: CT CHEST FINDINGS Cardiovascular: Aortic atherosclerosis. Tortuous thoracic aorta. Normal heart size, without pericardial effusion. Lad coronary artery calcification. Right lower lobe lobar and segmental nearly occlusive pulmonary emboli, including on 91/15 and 99/17. No evidence of right heart strain. Mediastinum/Nodes: No mediastinal or hilar adenopathy. Lungs/Pleura: No pleural fluid. Moderate centrilobular and paraseptal emphysema. No pulmonary metastasis. Musculoskeletal: Degenerative changes of the left acromioclavicular joint are advanced. Lytic lesion within the left side of the T12 vertebral body measures 2.3 cm on 151/15 and 103/19. CT ABDOMEN FINDINGS Hepatobiliary: Mild cirrhosis. Multifocal hepatocellular carcinoma. Dominant mass or masses throughout the majority of the right lobe. Example at 10.2 x 14.6 cm on 47/10. At the site of a 5.0 x 4.3 cm lesion on 07/15/2018 (when remeasured). Left-sided lesions including a segment 2 hypervascular mass at 4.5 x 3.3 cm, new on 55/10. Normal gallbladder, without biliary ductal dilatation. Pancreas: Calcifications within the pancreatic head may relate to chronic calcific pancreatitis. No duct dilatation or acute inflammation. Spleen: Normal in size, without focal abnormality. Adrenals/Urinary Tract: Normal adrenal glands. Normal kidneys, without hydronephrosis. Stomach/Bowel: Normal stomach, without wall thickening. Normal colon, appendix, and terminal ileum. Normal small bowel. Vascular/Lymphatic: Advanced aortic and branch vessel atherosclerosis. 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 Right hepatic vein thrombus including on 143/15. Prominent porta hepatis nodes are likely reactive in the setting of cirrhosis, grossly similar to on the prior MRI. Other: No ascites.  No evidence of omental or peritoneal disease. Musculoskeletal: Right-sided  L4 osseous and soft tissue mass including at 5.5 x 5.0 cm on 217/15. L 4-5 grade 1 anterolisthesis. IMPRESSION: 1. Significant progression of multifocal bilateral hepatocellular carcinoma. 2. Osseous metastasis, including a dominant right-sided L4 lesion. Consider further evaluation with lumbar and possibly thoracic spine MRI, given the size of the dominant right-sided L4 lesion. 3. Right-sided pulmonary embolism, without evidence of right heart strain. 4. No evidence of metastatic disease within the chest.  5. 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 6. Aortic atherosclerosis (ICD10-I70.0), coronary artery atherosclerosis and emphysema (ICD10-J43.9). These results will be called to the ordering clinician or representative by the Radiologist Assistant, and communication documented in the PACS or [employer]. Electronically Signed   By: Abigail Miyamoto M.D.   On: 04/24/2020 12:32   CT ABDOMEN W WO CONTRAST  Result Date: 04/24/2020 CLINICAL DATA:  Hepatocellular carcinoma, restaging. EXAM: CT CHEST WITH CONTRAST CT ABDOMEN WITH AND WITHOUT CONTRAST TECHNIQUE: Multidetector CT imaging of the chest was performed during intravenous contrast administration. Multidetector CT imaging of the abdomen was performed following the standard protocol before and during bolus administration of intravenous contrast. CONTRAST:  112mL ISOVUE-300 IOPAMIDOL (ISOVUE-300) INJECTION 61% COMPARISON:  PET of 08/11/2018. Abdominal MRI of 07/15/2018. No prior chest CT. FINDINGS: CT CHEST FINDINGS Cardiovascular: Aortic atherosclerosis. Tortuous thoracic aorta. Normal heart size, without pericardial effusion. Lad coronary artery calcification. Right lower lobe lobar and segmental nearly occlusive pulmonary emboli, including on 91/15 and 99/17. No evidence of right heart strain. Mediastinum/Nodes: No mediastinal or hilar adenopathy. Lungs/Pleura: No pleural fluid. Moderate centrilobular and paraseptal emphysema.  No pulmonary metastasis. Musculoskeletal: Degenerative changes of the left acromioclavicular joint are advanced. Lytic lesion within the left side of the T12 vertebral body measures 2.3 cm on 151/15 and 103/19. CT ABDOMEN FINDINGS Hepatobiliary: Mild cirrhosis. Multifocal hepatocellular carcinoma. Dominant mass or masses throughout the majority of the right lobe. Example at 10.2 x 14.6 cm on 47/10. At the site of a 5.0 x 4.3 cm lesion on 07/15/2018 (when remeasured). Left-sided lesions including a segment 2 hypervascular mass at 4.5 x 3.3 cm, new on 55/10. Normal gallbladder, without biliary ductal dilatation. Pancreas: Calcifications within the pancreatic head may relate to chronic calcific pancreatitis. No duct dilatation or acute inflammation. Spleen: Normal in size, without focal abnormality. Adrenals/Urinary Tract: Normal adrenal glands. Normal kidneys, without hydronephrosis. Stomach/Bowel: Normal stomach, without wall thickening. Normal colon, appendix, and terminal ileum. Normal small bowel. Vascular/Lymphatic: Advanced aortic and branch vessel atherosclerosis. 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 Right hepatic vein thrombus including on 143/15. Prominent porta hepatis nodes are likely reactive in the setting of cirrhosis, grossly similar to on the prior MRI. Other: No ascites.  No evidence of omental or peritoneal disease. Musculoskeletal: Right-sided L4 osseous and soft tissue mass including at 5.5 x 5.0 cm on 217/15. L 4-5 grade 1 anterolisthesis. IMPRESSION: 1. Significant progression of multifocal bilateral hepatocellular carcinoma. 2. Osseous metastasis, including a dominant right-sided L4 lesion. Consider further evaluation with lumbar and possibly thoracic spine MRI, given the size of the dominant right-sided L4 lesion. 3. Right-sided pulmonary embolism, without evidence of right heart strain. 4. No evidence of metastatic disease within the chest. 5. Right portal and hepatic  vein occlusion, presumably by tumor thrombus. 6. Aortic atherosclerosis (ICD10-I70.0), coronary artery atherosclerosis and emphysema (ICD10-J43.9). These results will be called to the ordering clinician or representative by the Radiologist Assistant, and communication documented in the PACS or [employer]. Electronically Signed   By: Abigail Miyamoto M.D.   On: 04/24/2020 12:32    Procedures .Critical Care Performed by: Volanda Napoleon, PA-C Authorized by: Volanda Napoleon, PA-C   Critical care provider statement:    Critical care time (minutes):  35   Critical care was necessary to treat or prevent imminent or life-threatening deterioration of the following conditions: PE.   Critical care was time spent personally by me on the following activities:  Discussions with  consultants, evaluation of patient's response to treatment, examination of patient, ordering and performing treatments and interventions, ordering and review of laboratory studies, ordering and review of radiographic studies, pulse oximetry, re-evaluation of patient's condition, obtaining history from patient or surrogate and review of old charts   (including critical care time)  Medications Ordered in ED Medications  sodium chloride flush (NS) 0.9 % injection 3 mL (has no administration in time range)  heparin ADULT infusion 100 units/mL (25000 units/240mL sodium chloride 0.45%) (1,200 Units/hr Intravenous New Bag/Given 04/24/20 2310)  heparin bolus via infusion 4,500 Units (4,500 Units Intravenous Bolus from Bag 04/24/20 2307)    ED Course  I have reviewed the triage vital signs and the nursing notes.  Pertinent labs & imaging results that were available during my care of the patient were reviewed by me and considered in my medical decision making (see chart for details).    MDM Rules/Calculators/A&P                      59 year old male who presents for evaluation of abnormal CT.  He reports that he has been having  some worsening hip and back pain and some abdominal pain.  He reports that the last time, he had the symptoms, he had a CT scan that showed some liver abnormalities with his primary care doctor ordered the scans.  He was called today and told that the CT scans were abnormal and that he needed to come to the emergency department.  On initial arrival, he is afebrile, nontoxic-appearing.  Vital signs are stable.  No evidence of respiratory distress.  He has no PE risk factors.  He has never had a PE prior.  Plan to check labs.  We will review his scans.  I reviewed his imaging from today.  CT chest shows significant progression of multifocal bilateral hepatocellular carcinoma.  There is osseous metastasis including a dominant right-sided L4 lesion.  He also has evidence of right portal and hepatic vein occlusion.  There is also mention of right-sided pulmonary embolism that is in the lower lobe and segmental area.  There is a lytic lesion within the left side of the T12 vertebral body that measures 2.3 cm.  At this time, I suspect that the PE is likely secondary to cancer.  No other PE risk factors that would provoke this.  CBC shows no leukocytosis.  Hemoglobin is 8.7.  The last hemoglobin I have on him was in February 2020 which was 13.4.  His fecal occult was negative.  He has not noted any blood in stool.  BMP shows sodium of 134.  Review of his chart showed that he had evidence of hepatic abnormalities on the scan back in 2019.  There was some concern at that time that it was hepatocellular carcinoma.  He had a PET scan that showed no evidence of metastasis.  He also had evidence of hepatitis.  07/15/2018: Liver MRI revealed multiple hypervascular liver lesions highly suspicious for malignancy. 08/11/2018: PET scan showed no hypermetabolic masses identified within the liver or elsewhere.  However multifocal hepatocellular carcinoma could not be excluded. 09/08/2018: He had ultrasound-guided liver biopsy  which revealed pathology concerning for hepatocellular carcinoma.  There is comes concerned that this may be some concurrent autoimmune hepatitis. 09/14/2018: He had lab work that was positive for hepatitis C.  AFP was 21.6.  CEA was 7.5.  This was the last follow-up appointment that I see with oncology. 10/10/2018: He followed up with  primary care doctor.  He had called back to the cancer center about referral to a liver specialist but did not get any answer.  Discussed patient with Dr. Burr Medico (oncology). She will plan to consult on patient tomorrow. Recommends proceeding with heparin.   Updated patient on plan. He is agreeable. Plan for admission.   Discussed patient with Dr. Cyd Silence (hospitalist) who accepts patient for admission.   Portions of this note were generated with [employer]. Dictation errors may occur despite best attempts at proofreading.  Final Clinical Impression(s) / ED Diagnoses Final diagnoses:  Acute pulmonary embolism without acute cor pulmonale, unspecified pulmonary embolism type (HCC)  Anemia, unspecified type  Hepatic lesion    Rx / DC Orders ED Discharge Orders    None       Desma Mcgregor 04/24/20 2358    Fredia Sorrow, MD 04/25/20 1536

## 2020-04-24 NOTE — ED Triage Notes (Signed)
Pt arrives to ED, NAD, stating his doctor sent him here because of his CT Scan results. (See results in chart). Pt says he has on and off SOB and pain in the right hip/leg.

## 2020-04-25 ENCOUNTER — Encounter (HOSPITAL_COMMUNITY): Payer: Self-pay | Admitting: Internal Medicine

## 2020-04-25 ENCOUNTER — Observation Stay (HOSPITAL_COMMUNITY): Payer: Medicaid Other

## 2020-04-25 DIAGNOSIS — Z515 Encounter for palliative care: Secondary | ICD-10-CM | POA: Diagnosis not present

## 2020-04-25 DIAGNOSIS — D649 Anemia, unspecified: Secondary | ICD-10-CM

## 2020-04-25 DIAGNOSIS — C220 Liver cell carcinoma: Secondary | ICD-10-CM | POA: Diagnosis present

## 2020-04-25 DIAGNOSIS — I81 Portal vein thrombosis: Secondary | ICD-10-CM | POA: Diagnosis present

## 2020-04-25 DIAGNOSIS — M79651 Pain in right thigh: Secondary | ICD-10-CM | POA: Diagnosis not present

## 2020-04-25 DIAGNOSIS — Z8249 Family history of ischemic heart disease and other diseases of the circulatory system: Secondary | ICD-10-CM | POA: Diagnosis not present

## 2020-04-25 DIAGNOSIS — R5383 Other fatigue: Secondary | ICD-10-CM | POA: Diagnosis present

## 2020-04-25 DIAGNOSIS — C7951 Secondary malignant neoplasm of bone: Secondary | ICD-10-CM | POA: Diagnosis present

## 2020-04-25 DIAGNOSIS — B1920 Unspecified viral hepatitis C without hepatic coma: Secondary | ICD-10-CM | POA: Diagnosis present

## 2020-04-25 DIAGNOSIS — Z87891 Personal history of nicotine dependence: Secondary | ICD-10-CM | POA: Diagnosis not present

## 2020-04-25 DIAGNOSIS — Z791 Long term (current) use of non-steroidal anti-inflammatories (NSAID): Secondary | ICD-10-CM | POA: Diagnosis not present

## 2020-04-25 DIAGNOSIS — G893 Neoplasm related pain (acute) (chronic): Secondary | ICD-10-CM | POA: Diagnosis not present

## 2020-04-25 DIAGNOSIS — D509 Iron deficiency anemia, unspecified: Secondary | ICD-10-CM | POA: Diagnosis present

## 2020-04-25 DIAGNOSIS — I2699 Other pulmonary embolism without acute cor pulmonale: Principal | ICD-10-CM

## 2020-04-25 DIAGNOSIS — Z7189 Other specified counseling: Secondary | ICD-10-CM | POA: Diagnosis not present

## 2020-04-25 DIAGNOSIS — M546 Pain in thoracic spine: Secondary | ICD-10-CM | POA: Diagnosis present

## 2020-04-25 DIAGNOSIS — Z79899 Other long term (current) drug therapy: Secondary | ICD-10-CM | POA: Diagnosis not present

## 2020-04-25 DIAGNOSIS — I2510 Atherosclerotic heart disease of native coronary artery without angina pectoris: Secondary | ICD-10-CM | POA: Diagnosis present

## 2020-04-25 DIAGNOSIS — M25551 Pain in right hip: Secondary | ICD-10-CM | POA: Diagnosis not present

## 2020-04-25 DIAGNOSIS — K5900 Constipation, unspecified: Secondary | ICD-10-CM | POA: Diagnosis present

## 2020-04-25 DIAGNOSIS — Z20822 Contact with and (suspected) exposure to covid-19: Secondary | ICD-10-CM | POA: Diagnosis present

## 2020-04-25 DIAGNOSIS — M109 Gout, unspecified: Secondary | ICD-10-CM | POA: Diagnosis present

## 2020-04-25 DIAGNOSIS — Z809 Family history of malignant neoplasm, unspecified: Secondary | ICD-10-CM | POA: Diagnosis not present

## 2020-04-25 LAB — MAGNESIUM: Magnesium: 1.8 mg/dL (ref 1.7–2.4)

## 2020-04-25 LAB — CBC
HCT: 28 % — ABNORMAL LOW (ref 39.0–52.0)
Hemoglobin: 8.5 g/dL — ABNORMAL LOW (ref 13.0–17.0)
MCH: 27.3 pg (ref 26.0–34.0)
MCHC: 30.4 g/dL (ref 30.0–36.0)
MCV: 90 fL (ref 80.0–100.0)
Platelets: 271 10*3/uL (ref 150–400)
RBC: 3.11 MIL/uL — ABNORMAL LOW (ref 4.22–5.81)
RDW: 15 % (ref 11.5–15.5)
WBC: 5.8 10*3/uL (ref 4.0–10.5)
nRBC: 0 % (ref 0.0–0.2)

## 2020-04-25 LAB — SARS CORONAVIRUS 2 BY RT PCR (HOSPITAL ORDER, PERFORMED IN ~~LOC~~ HOSPITAL LAB): SARS Coronavirus 2: NEGATIVE

## 2020-04-25 LAB — HIV ANTIBODY (ROUTINE TESTING W REFLEX): HIV Screen 4th Generation wRfx: NONREACTIVE

## 2020-04-25 LAB — IRON AND TIBC
Iron: 31 ug/dL — ABNORMAL LOW (ref 45–182)
Saturation Ratios: 5 % — ABNORMAL LOW (ref 17.9–39.5)
TIBC: 589 ug/dL — ABNORMAL HIGH (ref 250–450)
UIBC: 558 ug/dL

## 2020-04-25 LAB — VITAMIN D 25 HYDROXY (VIT D DEFICIENCY, FRACTURES): Vit D, 25-Hydroxy: 32.75 ng/mL (ref 30–100)

## 2020-04-25 LAB — FOLATE: Folate: 10.3 ng/mL (ref >5.9)

## 2020-04-25 LAB — TSH: TSH: 1 u[IU]/mL (ref 0.350–4.500)

## 2020-04-25 LAB — HEPARIN LEVEL (UNFRACTIONATED)
Heparin Unfractionated: 0.87 IU/mL — ABNORMAL HIGH (ref 0.30–0.70)
Heparin Unfractionated: 0.87 IU/mL — ABNORMAL HIGH (ref 0.30–0.70)
Heparin Unfractionated: 0.69 IU/mL (ref 0.30–0.70)

## 2020-04-25 LAB — VITAMIN B12: Vitamin B-12: 260 pg/mL (ref 180–914)

## 2020-04-25 MED ORDER — ONDANSETRON HCL 4 MG/2ML IJ SOLN: 4.0000 mg | Freq: Four times a day (QID) | INTRAMUSCULAR | Status: DC | PRN

## 2020-04-25 MED ORDER — SODIUM CHLORIDE 0.9 % IV SOLN
510.0000 mg | Freq: Once | INTRAVENOUS | Status: AC
  Administered 2020-04-25: 510 mg via INTRAVENOUS
  Filled 2020-04-25: qty 17

## 2020-04-25 MED ORDER — OXYCODONE HCL 5 MG PO TABS
5.0000 mg | ORAL_TABLET | ORAL | Status: DC | PRN
  Administered 2020-04-25 – 2020-04-26 (×5): 5 mg via ORAL
  Filled 2020-04-25 (×4): qty 1

## 2020-04-25 MED ORDER — ACETAMINOPHEN 325 MG PO TABS: 325.0000 mg | ORAL_TABLET | Freq: Four times a day (QID) | ORAL | Status: DC | PRN

## 2020-04-25 MED ORDER — FERROUS SULFATE 325 (65 FE) MG PO TABS
325.0000 mg | ORAL_TABLET | Freq: Two times a day (BID) | ORAL | Status: DC
  Administered 2020-04-26: 325 mg via ORAL
  Filled 2020-04-25: qty 1

## 2020-04-25 MED ORDER — SENNOSIDES-DOCUSATE SODIUM 8.6-50 MG PO TABS: 2.0000 | ORAL_TABLET | Freq: Every evening | ORAL | Status: DC | PRN

## 2020-04-25 MED ORDER — ACETAMINOPHEN 650 MG RE SUPP: 325.0000 mg | Freq: Four times a day (QID) | RECTAL | Status: DC | PRN

## 2020-04-25 MED ORDER — GADOBUTROL 1 MMOL/ML IV SOLN
7.0000 mL | Freq: Once | INTRAVENOUS | Status: AC | PRN
  Administered 2020-04-25: 7 mL via INTRAVENOUS

## 2020-04-25 MED ORDER — FENTANYL CITRATE (PF) 100 MCG/2ML IJ SOLN
50.0000 ug | INTRAMUSCULAR | Status: AC | PRN
  Administered 2020-04-25 (×2): 50 ug via INTRAVENOUS
  Filled 2020-04-25 (×2): qty 2

## 2020-04-25 MED ORDER — POLYETHYLENE GLYCOL 3350 17 G PO PACK
17.0000 g | PACK | Freq: Every day | ORAL | Status: DC | PRN
  Filled 2020-04-25: qty 1

## 2020-04-25 MED ORDER — ONDANSETRON HCL 4 MG PO TABS: 4.0000 mg | ORAL_TABLET | Freq: Four times a day (QID) | ORAL | Status: DC | PRN

## 2020-04-25 MED ORDER — IPRATROPIUM-ALBUTEROL 0.5-2.5 (3) MG/3ML IN SOLN: 3.0000 mL | RESPIRATORY_TRACT | Status: DC | PRN

## 2020-04-25 MED ORDER — MORPHINE SULFATE (PF) 4 MG/ML IV SOLN: 4.0000 mg | INTRAVENOUS | Status: DC | PRN

## 2020-04-25 NOTE — ED Notes (Signed)
Pharmacy has called to reduce the hepatin drip to 1050  Rate changed

## 2020-04-25 NOTE — Progress Notes (Signed)
ANTICOAGULATION CONSULT NOTE - Follow Up Consult  Pharmacy Consult for heparin Indication: pulmonary embolus and portal vein thrombosis  Labs: Recent Labs    04/24/20 1945 04/24/20 2216 04/25/20 0604  HGB 8.7*  --  8.5*  HCT 28.7*  --  28.0*  PLT 293  --  271  LABPROT  --  13.0  --   INR  --  1.0  --   HEPARINUNFRC  --   --  0.87*  CREATININE 1.24  --   --     Assessment: 59yo male supratherapeutic on heparin with initial dosing for PE and portal vein thrombosis; no gtt issues or signs of bleeding per RN.  Goal of Therapy:  Heparin level 0.3-0.7 units/ml   Plan:  Will decrease heparin gtt by 2 units/kg/hr to 1050 units/hr and check level in 6 hours.    Wynona Neat, PharmD, BCPS  04/25/2020,6:41 AM

## 2020-04-25 NOTE — H&P (Signed)
History and Physical    KARO LEINONEN [id number] DOB: 1961/04/25 DOA: 04/24/2020  PCP: Mikey College, NP (Inactive)  Patient coming from: Home   Chief Complaint:  Chief Complaint  Patient presents with  . Abnormal Test     HPI:   59 year old male with past medical history of gout, hepatitis C and hepatocellular carcinoma diagnosed 08/2018 via liver biopsy lost to follow-up who presents to [facility] emergency department after being sent by his primary care provider due to an acute pulmonary embolism found on CT imaging.  Patient explains that for the past several months he has been experiencing right hip pain.  Patient describes this pain initially as mild in intensity but progressively worsening over the next several months.  Pain is now severe in intensity, throbbing in quality, radiating down the right thigh and worse with movement of the affected extremity or weightbearing.  Patient denies any fevers, redness or swelling of the affected extremity.  Patient denies any sick contacts.  Patient denies any recent trauma to the area.  Upon further questioning patient also admits to generalized malaise and weakness for the past several months.  Patient denies any changes in appetite, night sweats, shortness of breath or chest pain.  Patient does endorse a several week history of nonproductive cough.  Patient symptoms continue to worsen until he eventually presented to his primary care provider's office.  Considering the patient's diagnosis of hepatocellular carcinoma in 08/2018 without subsequent treatment, CT imaging of the chest abdomen pelvis was ordered.  Not only was there evidence of significant progression of hepatocellular carcinoma with metastases patient was also found to have a right lung pulmonary embolism and the patient was sent to [facility] emergency department for evaluation.  Upon evaluation in the emergency department, CT imaging of the chest  abdomen pelvis was reviewed with Dr. Burr Medico with oncology.  She agreed with initiation of intravenous heparin infusion and stated that oncology would evaluate the patient in the morning as a consult.  The hospitalist group was then called to assess patient for admission the hospital.    Review of Systems: A 10-system review of systems has been performed and all systems are negative with the exception of what is listed in the HPI.    Past Medical History:  Diagnosis Date  . Gout     History reviewed. No pertinent surgical history.   reports that he quit smoking about 20 months ago. His smoking use included cigarettes. He quit after 18.00 years of use. He has never used smokeless tobacco. He reports current alcohol use. He reports that he does not use drugs.  No Known Allergies  Family History  Problem Relation Age of Onset  . Cancer Mother   . Cancer Father   . Hypertension Maternal Uncle   . Healthy Daughter   . Healthy Son      Prior to Admission medications   Medication Sig Start Date End Date Taking? Authorizing Provider  meloxicam (MOBIC) 7.5 MG tablet Take 7.5 mg by mouth 2 (two) times daily. 02/28/20  Yes [provider]  allopurinol (ZYLOPRIM) 100 MG tablet Take 1 tablet (100 mg total) by mouth daily. Patient not taking: Reported on 04/24/2020 06/13/18   Mikey College, NP  cyclobenzaprine (FLEXERIL) 10 MG tablet Take 1 tablet (10 mg total) by mouth at bedtime. Patient not taking: Reported on 04/24/2020 10/10/18   Mikey College, NP  diclofenac (VOLTAREN) 75 MG EC tablet Take 1 tablet (75 mg  total) by mouth 2 (two) times daily as needed for moderate pain (back pain). Patient not taking: Reported on 04/24/2020 10/10/18   Mikey College, NP  ketorolac (TORADOL) 10 MG tablet Take 1 tablet (10 mg total) by mouth every 6 (six) hours as needed. Patient not taking: Reported on 10/10/2018 10/06/18   Sable Feil, PA-C  Lenvatinib 12 mg daily dose  (LENVIMA) 3 x 4 MG capsule Take 12 mg by mouth daily. Patient not taking: Reported on 04/24/2020 10/20/18   Lequita Asal, MD    Physical Exam: Vitals:   04/24/20 2130 04/24/20 2200 04/24/20 2215 04/24/20 2300  BP: 135/87  (!) 145/84 (!) 146/92  Pulse: 90  97 90  Resp: 16  17 (!) 24  Temp:      TempSrc:      SpO2: 98%  97% 97%  Weight:  74.8 kg    Height:  5\' 6"  (1.676 m)      Constitutional: Acute alert and oriented x3, patient is in distress due to right lower extremity pain. Skin: no rashes, no lesions, good skin turgor noted. Eyes: Pupils are equally reactive to light.  No evidence of scleral icterus or conjunctival pallor.  ENMT: Moist mucous membranes noted.  Posterior pharynx clear of any exudate or lesions.   Neck: normal, supple, no masses, no thyromegaly.  No evidence of jugular venous distension.   Respiratory: Bibasilar rales noted without evidence of wheezing.  Normal respiratory effort. No accessory muscle use.  Cardiovascular: Regular rate and rhythm, no murmurs / rubs / gallops. No extremity edema. 2+ pedal pulses. No carotid bruits.  Chest:   Nontender without crepitus or deformity.   Back:   Nontender without crepitus or deformity. Abdomen: Abdomen is soft and nontender.  No evidence of intra-abdominal masses.  Positive bowel sounds noted in all quadrants.   Musculoskeletal: No joint deformity upper and lower extremities. Good ROM, no contractures. Normal muscle tone.  Neurologic: CN 2-12 grossly intact. Sensation intact, strength noted to be 5 out of 5 in all 4 extremities.  Patient is following all commands.  Patient is responsive to verbal stimuli.   Psychiatric: Patient presents as a normal mood with appropriate affect.  Patient seems to possess insight as to theircurrent situation.     Labs on Admission: I have personally reviewed following labs and imaging studies -   CBC: Recent Labs  Lab 04/24/20 1945  WBC 6.5  HGB 8.7*  HCT 28.7*  MCV 89.4    PLT 0000000   Basic Metabolic Panel: Recent Labs  Lab 04/24/20 1945  NA 134*  K 4.3  CL 103  CO2 22  GLUCOSE 85  BUN 11  CREATININE 1.24  CALCIUM 9.1   GFR: Estimated Creatinine Clearance: 56.5 mL/min (by C-G formula based on SCr of 1.24 mg/dL). Liver Function Tests: Recent Labs  Lab 04/24/20 2216  AST 103*  ALT 81*  ALKPHOS 205*  BILITOT 1.3*  PROT 7.2  ALBUMIN 3.2*   No results for input(s): LIPASE, AMYLASE in the last 168 hours. No results for input(s): AMMONIA in the last 168 hours. Coagulation Profile: Recent Labs  Lab 04/24/20 2216  INR 1.0   Cardiac Enzymes: No results for input(s): CKTOTAL, CKMB, CKMBINDEX, TROPONINI in the last 168 hours. BNP (last 3 results) No results for input(s): PROBNP in the last 8760 hours. HbA1C: No results for input(s): HGBA1C in the last 72 hours. CBG: No results for input(s): GLUCAP in the last 168 hours. Lipid Profile: No  results for input(s): CHOL, HDL, LDLCALC, TRIG, CHOLHDL, LDLDIRECT in the last 72 hours. Thyroid Function Tests: No results for input(s): TSH, T4TOTAL, FREET4, T3FREE, THYROIDAB in the last 72 hours. Anemia Panel: No results for input(s): VITAMINB12, FOLATE, FERRITIN, TIBC, IRON, RETICCTPCT in the last 72 hours. Urine analysis:    Component Value Date/Time   COLORURINE YELLOW (A) 05/25/2018 1119   APPEARANCEUR CLEAR (A) 05/25/2018 1119   LABSPEC 1.016 05/25/2018 1119   PHURINE 5.0 05/25/2018 1119   GLUCOSEU NEGATIVE 05/25/2018 1119   HGBUR NEGATIVE 05/25/2018 1119   BILIRUBINUR NEGATIVE 05/25/2018 1119   KETONESUR NEGATIVE 05/25/2018 1119   PROTEINUR NEGATIVE 05/25/2018 1119   NITRITE NEGATIVE 05/25/2018 1119   LEUKOCYTESUR NEGATIVE 05/25/2018 1119    Radiological Exams on Admission - Personally Reviewed: CT CHEST W CONTRAST  Result Date: 04/24/2020 CLINICAL DATA:  Hepatocellular carcinoma, restaging. EXAM: CT CHEST WITH CONTRAST CT ABDOMEN WITH AND WITHOUT CONTRAST TECHNIQUE: Multidetector CT  imaging of the chest was performed during intravenous contrast administration. Multidetector CT imaging of the abdomen was performed following the standard protocol before and during bolus administration of intravenous contrast. CONTRAST:  130mL ISOVUE-300 IOPAMIDOL (ISOVUE-300) INJECTION 61% COMPARISON:  PET of 08/11/2018. Abdominal MRI of 07/15/2018. No prior chest CT. FINDINGS: CT CHEST FINDINGS Cardiovascular: Aortic atherosclerosis. Tortuous thoracic aorta. Normal heart size, without pericardial effusion. Lad coronary artery calcification. Right lower lobe lobar and segmental nearly occlusive pulmonary emboli, including on 91/15 and 99/17. No evidence of right heart strain. Mediastinum/Nodes: No mediastinal or hilar adenopathy. Lungs/Pleura: No pleural fluid. Moderate centrilobular and paraseptal emphysema. No pulmonary metastasis. Musculoskeletal: Degenerative changes of the left acromioclavicular joint are advanced. Lytic lesion within the left side of the T12 vertebral body measures 2.3 cm on 151/15 and 103/19. CT ABDOMEN FINDINGS Hepatobiliary: Mild cirrhosis. Multifocal hepatocellular carcinoma. Dominant mass or masses throughout the majority of the right lobe. Example at 10.2 x 14.6 cm on 47/10. At the site of a 5.0 x 4.3 cm lesion on 07/15/2018 (when remeasured). Left-sided lesions including a segment 2 hypervascular mass at 4.5 x 3.3 cm, new on 55/10. Normal gallbladder, without biliary ductal dilatation. Pancreas: Calcifications within the pancreatic head may relate to chronic calcific pancreatitis. No duct dilatation or acute inflammation. Spleen: Normal in size, without focal abnormality. Adrenals/Urinary Tract: Normal adrenal glands. Normal kidneys, without hydronephrosis. Stomach/Bowel: Normal stomach, without wall thickening. Normal colon, appendix, and terminal ileum. Normal small bowel. Vascular/Lymphatic: Advanced aortic and branch vessel atherosclerosis. Right portal vein occlusion, presumably  by tumor thrombus. Right hepatic vein thrombus including on 143/15. Prominent porta hepatis nodes are likely reactive in the setting of cirrhosis, grossly similar to on the prior MRI. Other: No ascites.  No evidence of omental or peritoneal disease. Musculoskeletal: Right-sided L4 osseous and soft tissue mass including at 5.5 x 5.0 cm on 217/15. L 4-5 grade 1 anterolisthesis. IMPRESSION: 1. Significant progression of multifocal bilateral hepatocellular carcinoma. 2. Osseous metastasis, including a dominant right-sided L4 lesion. Consider further evaluation with lumbar and possibly thoracic spine MRI, given the size of the dominant right-sided L4 lesion. 3. Right-sided pulmonary embolism, without evidence of right heart strain. 4. No evidence of metastatic disease within the chest. 5. 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 6. Aortic atherosclerosis (ICD10-I70.0), coronary artery atherosclerosis and emphysema (ICD10-J43.9). These results will be called to the ordering clinician or representative by the Radiologist Assistant, and communication documented in the PACS or [employer]. Electronically Signed   By: Adria Devon.D.  On: 04/24/2020 12:32   CT ABDOMEN W WO CONTRAST  Result Date: 04/24/2020 CLINICAL DATA:  Hepatocellular carcinoma, restaging. EXAM: CT CHEST WITH CONTRAST CT ABDOMEN WITH AND WITHOUT CONTRAST TECHNIQUE: Multidetector CT imaging of the chest was performed during intravenous contrast administration. Multidetector CT imaging of the abdomen was performed following the standard protocol before and during bolus administration of intravenous contrast. CONTRAST:  144mL ISOVUE-300 IOPAMIDOL (ISOVUE-300) INJECTION 61% COMPARISON:  PET of 08/11/2018. Abdominal MRI of 07/15/2018. No prior chest CT. FINDINGS: CT CHEST FINDINGS Cardiovascular: Aortic atherosclerosis. Tortuous thoracic aorta. Normal heart size, without pericardial effusion. Lad coronary artery  calcification. Right lower lobe lobar and segmental nearly occlusive pulmonary emboli, including on 91/15 and 99/17. No evidence of right heart strain. Mediastinum/Nodes: No mediastinal or hilar adenopathy. Lungs/Pleura: No pleural fluid. Moderate centrilobular and paraseptal emphysema. No pulmonary metastasis. Musculoskeletal: Degenerative changes of the left acromioclavicular joint are advanced. Lytic lesion within the left side of the T12 vertebral body measures 2.3 cm on 151/15 and 103/19. CT ABDOMEN FINDINGS Hepatobiliary: Mild cirrhosis. Multifocal hepatocellular carcinoma. Dominant mass or masses throughout the majority of the right lobe. Example at 10.2 x 14.6 cm on 47/10. At the site of a 5.0 x 4.3 cm lesion on 07/15/2018 (when remeasured). Left-sided lesions including a segment 2 hypervascular mass at 4.5 x 3.3 cm, new on 55/10. Normal gallbladder, without biliary ductal dilatation. Pancreas: Calcifications within the pancreatic head may relate to chronic calcific pancreatitis. No duct dilatation or acute inflammation. Spleen: Normal in size, without focal abnormality. Adrenals/Urinary Tract: Normal adrenal glands. Normal kidneys, without hydronephrosis. Stomach/Bowel: Normal stomach, without wall thickening. Normal colon, appendix, and terminal ileum. Normal small bowel. Vascular/Lymphatic: Advanced aortic and branch vessel atherosclerosis. 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 Right hepatic vein thrombus including on 143/15. Prominent porta hepatis nodes are likely reactive in the setting of cirrhosis, grossly similar to on the prior MRI. Other: No ascites.  No evidence of omental or peritoneal disease. Musculoskeletal: Right-sided L4 osseous and soft tissue mass including at 5.5 x 5.0 cm on 217/15. L 4-5 grade 1 anterolisthesis. IMPRESSION: 1. Significant progression of multifocal bilateral hepatocellular carcinoma. 2. Osseous metastasis, including a dominant right-sided L4 lesion.  Consider further evaluation with lumbar and possibly thoracic spine MRI, given the size of the dominant right-sided L4 lesion. 3. Right-sided pulmonary embolism, without evidence of right heart strain. 4. No evidence of metastatic disease within the chest. 5. 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 6. Aortic atherosclerosis (ICD10-I70.0), coronary artery atherosclerosis and emphysema (ICD10-J43.9). These results will be called to the ordering clinician or representative by the Radiologist Assistant, and communication documented in the PACS or [employer]. Electronically Signed   By: Abigail Miyamoto M.D.   On: 04/24/2020 12:32    EKG: Personally reviewed.  Rhythm is normal sinus rhythm with heart rate of 81.  No dynamic ST segment changes appreciated.  Assessment/Plan Active Problems:   Hepatocellular carcinoma [facility])   Per review of older hospital records, patient was diagnosed with hepatocellular carcinoma in October 2019 via liver biopsy.  Patient at that time was seen by Dr. Geralyn Flash at the cancer center with the last note mentioning potential initiation of chemotherapy  Unfortunately, patient was lost to follow-up.  Patient claims that "nobody told me that I had anything."  Unfortunately repeat CT imaging of the chest abdomen pelvis reveals evidence of significant progression of disease.  There is extensive evidence of multifocal hepatocellular carcinoma of both lobes of the  liver  There is also evidence of possible right portal and hepatic vein occlusion, presumably secondary to underlying hepatocellular carcinoma  Furthermore there is evidence of metastases to bone including a right-sided L4 lesion measuring 5.5 x 5 cm  Finally, pulmonary embolism of the right lung was additionally found which is likely secondary to hypercoagulable state secondary to malignancy.  Case discussed with Dr. Burr Medico by the department provider who stated that oncology will  evaluate the patient in the morning to discuss treatment options.  Patient has been initiated on intravenous heparin infusion for treatment of acute pulmonary embolism.  Patient is surprisingly not experiencing any symptoms specifically related to his pulmonary embolism.  We are additionally obtaining MRI of the thoracolumbar spine to better identify metastatic disease to the spine as this is likely the cause of the patient's substantial right "hip" pain that radiates down the right leg.    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  Upon isolation and manipulation of the right hip joint on examination I am unable to reproduce pain.  I feel that the patient's pain may actually be secondary to radiculopathic symptoms due to his mets to his lumbar spine.  MRI of thoracolumbar spine ordered, metastatic lesions might be able to be managed with focal radiation therapy.  Provided patient with as needed opiate-based analgesics for pain.    Normocytic anemia   No clinical evidence of bleeding  Monitoring hemoglobin and hematocrit with serial CBCs  Iron panel, folate, vitamin B12 pending    Code Status:  Full code Family Communication: Deferred  Status is: Observation  The patient remains OBS appropriate and will d/c before 2 midnights.  Dispo: The patient is from: Home               Anticipated d/c is to: Home              Anticipated d/c date is: 2 days              Patient currently is not medically stable to d/c.        Vernelle Emerald MD [facility] Pager [phone]  If 7PM-7AM, please contact night-coverage [email or web address] Use universal [place] password for that web site. If you do not have the password, please call the hospital operator.  04/25/2020, 1:50 AM

## 2020-04-25 NOTE — ED Notes (Signed)
Ordered breakfast--Vernon [name] 

## 2020-04-25 NOTE — ED Notes (Signed)
The pt reports that his  Pain is better  Family sitting at  The bedside

## 2020-04-25 NOTE — Progress Notes (Signed)
PT Cancellation Note  Patient Details Name: Vernon [name] MRN: [id number] DOB: Nov 26, 1961   Cancelled Treatment:    Reason Eval/Treat Not Completed: Medical issues which prohibited therapy Pt with new PE and started on heparin at 2307 on 5/26. Per protocol, must be on heparin for 24 hours prior to initiation of therapy. Will follow up as pt medically appropriate and as schedule allows.   Lou Miner, DPT  Acute Rehabilitation Services  Pager: [phone] Office: [phone]    Rudean Hitt 04/25/2020, 10:34 AM

## 2020-04-25 NOTE — ED Notes (Signed)
The pt is alert family at   The bedside   Offered pain med he is thinking about it

## 2020-04-25 NOTE — Progress Notes (Signed)
PROGRESS NOTE    Vernon [name]  [id number] DOB: 1961-03-06 DOA: 04/24/2020 PCP: Mikey College, NP (Inactive)   Brief Narrative:  59 year old with history of gout, hep C, HCC diagnosed in October 2019 got lost in follow-up presented to PCP with ongoing generalized fatigue, progressive dyspnea and malaise.  CTA of the chest abdomen pelvis showed progression of his [place] with metastases, right-sided pulmonary embolism.  He was sent to ER for further evaluation, oncology was consulted.   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  Concerns of right portal/hepatic vein occlusion.  Lumbar bony mets. -Currently on heparin drip, we can transition to p.o. once confirmed he no longer needs any inpatient procedures. -Echocardiogram-ordered -Supportive care. -Pain control -Oncology consulted -We will consult palliative care.  Mid to low back pain -Metastatic lesions noted in his thoracic spine, lumbar vertebrae, sacral alla.  There is some tumor extension causing neuroforaminal stenosis.  Transaminitis -Secondary to [facility].  Normocytic anemia Iron deficiency anemia -TSH-normal, low iron saturation. -Normal folate -Order IV iron today followed by p.o. and bowel regimen tomorrow.  DVT prophylaxis: Heparin drip  Code Status: Full code Family Communication:    Status is: Inpatient    Dispo: The patient is from: Home              Anticipated d/c is to: Home              Anticipated d/c date is: 1 day              Patient currently is  not medically stable to d/c.  Currently patient is on IV heparin drip for pulmonary embolism ongoing evaluation for metastatic HCC.     Subjective: Having some low to mid back pain but no obvious red flags with neurologic symptoms.  Review of Systems Otherwise negative except as per HPI, including: General: Denies fever, chills, night sweats or unintended weight loss. Resp: Denies cough, wheezing, shortness of breath. Cardiac: Denies chest pain, palpitations, orthopnea, paroxysmal nocturnal dyspnea. GI: Denies abdominal pain, nausea, vomiting, diarrhea or constipation GU: Denies dysuria, frequency, hesitancy or incontinence MS: Denies muscle aches, joint pain or swelling Neuro: Denies headache, neurologic deficits (focal weakness, numbness, tingling), abnormal gait Psych: Denies anxiety, depression, SI/HI/AVH Skin: Denies new rashes or lesions ID: Denies sick contacts, exotic exposures, travel  Examination:  General exam: Appears calm and comfortable, cachectic frail with bilateral temporal wasting Respiratory system: Clear to auscultation. Respiratory effort normal. Cardiovascular system: S1 & S2 heard, RRR. No JVD, murmurs, rubs, gallops or clicks. No pedal edema. Gastrointestinal system: Abdomen is nondistended, soft and nontender. No organomegaly or masses felt. Normal bowel sounds heard. Central nervous system: Alert and oriented. No focal neurological deficits. Extremities: Symmetric 5 x 5 power. Skin: No rashes, lesions or ulcers Psychiatry: Judgement and insight appear normal. Mood & affect appropriate.     Objective: Vitals:   04/25/20 0515 04/25/20 0600 04/25/20 0730 04/25/20 0815  BP: (!) 154/93 (!) 141/89 (!) 148/96 (!) 148/87  Pulse:  88 86   Resp: 14 17 (!) 21 (!) 22  Temp:  TempSrc:      SpO2:  97% 99%   Weight:      Height:       No intake or output data in the 24 hours ending 04/25/20 0827 Filed Weights   04/24/20 2200  Weight: 74.8 kg      Data Reviewed:   CBC: Recent Labs  Lab 04/24/20 1945 04/25/20 0604  WBC 6.5 5.8  HGB 8.7* 8.5*  HCT 28.7* 28.0*  MCV 89.4 90.0  PLT 293 [id number]   Basic Metabolic Panel: Recent Labs  Lab 04/24/20 1945 04/25/20 0604  NA 134*  --   K 4.3  --   CL 103  --   CO2 22  --   GLUCOSE 85  --   BUN 11  --   CREATININE 1.24  --   CALCIUM 9.1  --   MG  --  1.8   GFR: Estimated Creatinine Clearance: 56.5 mL/min (by C-G formula based on SCr of 1.24 mg/dL). Liver Function Tests: Recent Labs  Lab 04/24/20 2216  AST 103*  ALT 81*  ALKPHOS 205*  BILITOT 1.3*  PROT 7.2  ALBUMIN 3.2*   No results for input(s): LIPASE, AMYLASE in the last 168 hours. No results for input(s): AMMONIA in the last 168 hours. Coagulation Profile: Recent Labs  Lab 04/24/20 2216  INR 1.0   Cardiac Enzymes: No results for input(s): CKTOTAL, CKMB, CKMBINDEX, TROPONINI in the last 168 hours. BNP (last 3 results) No results for input(s): PROBNP in the last 8760 hours. HbA1C: No results for input(s): HGBA1C in the last 72 hours. CBG: No results for input(s): GLUCAP in the last 168 hours. Lipid Profile: No results for input(s): CHOL, HDL, LDLCALC, TRIG, CHOLHDL, LDLDIRECT in the last 72 hours. Thyroid Function Tests: No results for input(s): TSH, T4TOTAL, FREET4, T3FREE, THYROIDAB in the last 72 hours. Anemia Panel: Recent Labs    04/25/20 0604  FOLATE 10.3   Sepsis Labs: No results for input(s): PROCALCITON, LATICACIDVEN in the last 168 hours.  Recent Results (from the past 240 hour(s))  SARS Coronavirus 2 by RT PCR (hospital order, performed in [facility] hospital lab) Nasopharyngeal Nasopharyngeal Swab     Status: None   Collection Time: 04/25/20 12:58 AM   Specimen: Nasopharyngeal Swab  Result Value Ref Range Status   SARS Coronavirus 2 NEGATIVE NEGATIVE Final    Comment: (NOTE) SARS-CoV-2 target nucleic acids are NOT DETECTED. The SARS-CoV-2 RNA is generally detectable in upper and  lower respiratory specimens during the acute phase of infection. The lowest concentration of SARS-CoV-2 viral copies this assay can detect is 250 copies / mL. A negative result does not preclude SARS-CoV-2 infection and should not be used as the sole basis for treatment or other patient management decisions.  A negative result may occur with improper specimen collection / handling, submission of specimen other than nasopharyngeal swab, presence of viral mutation(s) within the areas targeted by this assay, and inadequate number of viral copies (<250 copies / mL). A negative result must be combined with clinical observations, patient history, and epidemiological information. Fact Sheet for Patients:   StrictlyIdeas.no Fact Sheet for Healthcare Providers: BankingDealers.co.za This test is not yet approved or cleared  by the [place] FDA and has been authorized for detection and/or diagnosis of SARS-CoV-2 by FDA under an Emergency Use Authorization (EUA).  This EUA will remain in effect (meaning this test can be used) for the duration of the COVID-19 declaration under Section 564(b)(1) of the Act, 21 U.S.C.  section 360bbb-3(b)(1), unless the authorization is terminated or revoked sooner. Performed at [facility], [place] [address] [place], [place] [address]          Radiology Studies: CT CHEST W CONTRAST  Result Date: 04/24/2020 CLINICAL DATA:  Hepatocellular carcinoma, restaging. EXAM: CT CHEST WITH CONTRAST CT ABDOMEN WITH AND WITHOUT CONTRAST TECHNIQUE: Multidetector CT imaging of the chest was performed during intravenous contrast administration. Multidetector CT imaging of the abdomen was performed following the standard protocol before and during bolus administration of intravenous contrast. CONTRAST:  134mL ISOVUE-300 IOPAMIDOL (ISOVUE-300) INJECTION 61% COMPARISON:  PET of 08/11/2018. Abdominal MRI of 07/15/2018. No prior  chest CT. FINDINGS: CT CHEST FINDINGS Cardiovascular: Aortic atherosclerosis. Tortuous thoracic aorta. Normal heart size, without pericardial effusion. Lad coronary artery calcification. Right lower lobe lobar and segmental nearly occlusive pulmonary emboli, including on 91/15 and 99/17. No evidence of right heart strain. Mediastinum/Nodes: No mediastinal or hilar adenopathy. Lungs/Pleura: No pleural fluid. Moderate centrilobular and paraseptal emphysema. No pulmonary metastasis. Musculoskeletal: Degenerative changes of the left acromioclavicular joint are advanced. Lytic lesion within the left side of the T12 vertebral body measures 2.3 cm on 151/15 and 103/19. CT ABDOMEN FINDINGS Hepatobiliary: Mild cirrhosis. Multifocal hepatocellular carcinoma. Dominant mass or masses throughout the majority of the right lobe. Example at 10.2 x 14.6 cm on 47/10. At the site of a 5.0 x 4.3 cm lesion on 07/15/2018 (when remeasured). Left-sided lesions including a segment 2 hypervascular mass at 4.5 x 3.3 cm, new on 55/10. Normal gallbladder, without biliary ductal dilatation. Pancreas: Calcifications within the pancreatic head may relate to chronic calcific pancreatitis. No duct dilatation or acute inflammation. Spleen: Normal in size, without focal abnormality. Adrenals/Urinary Tract: Normal adrenal glands. Normal kidneys, without hydronephrosis. Stomach/Bowel: Normal stomach, without wall thickening. Normal colon, appendix, and terminal ileum. Normal small bowel. Vascular/Lymphatic: Advanced aortic and branch vessel atherosclerosis. 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 Right hepatic vein thrombus including on 143/15. Prominent porta hepatis nodes are likely reactive in the setting of cirrhosis, grossly similar to on the prior MRI. Other: No ascites.  No evidence of omental or peritoneal disease. Musculoskeletal: Right-sided L4 osseous and soft tissue mass including at 5.5 x 5.0 cm on 217/15. L 4-5 grade 1  anterolisthesis. IMPRESSION: 1. Significant progression of multifocal bilateral hepatocellular carcinoma. 2. Osseous metastasis, including a dominant right-sided L4 lesion. Consider further evaluation with lumbar and possibly thoracic spine MRI, given the size of the dominant right-sided L4 lesion. 3. Right-sided pulmonary embolism, without evidence of right heart strain. 4. No evidence of metastatic disease within the chest. 5. 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 6. Aortic atherosclerosis (ICD10-I70.0), coronary artery atherosclerosis and emphysema (ICD10-J43.9). These results will be called to the ordering clinician or representative by the Radiologist Assistant, and communication documented in the PACS or [employer]. Electronically Signed   By: Abigail Miyamoto M.D.   On: 04/24/2020 12:32   CT ABDOMEN W WO CONTRAST  Result Date: 04/24/2020 CLINICAL DATA:  Hepatocellular carcinoma, restaging. EXAM: CT CHEST WITH CONTRAST CT ABDOMEN WITH AND WITHOUT CONTRAST TECHNIQUE: Multidetector CT imaging of the chest was performed during intravenous contrast administration. Multidetector CT imaging of the abdomen was performed following the standard protocol before and during bolus administration of intravenous contrast. CONTRAST:  136mL ISOVUE-300 IOPAMIDOL (ISOVUE-300) INJECTION 61% COMPARISON:  PET of 08/11/2018. Abdominal MRI of 07/15/2018. No prior chest CT. FINDINGS: CT CHEST FINDINGS Cardiovascular: Aortic atherosclerosis. Tortuous thoracic aorta. Normal heart size, without pericardial  effusion. Lad coronary artery calcification. Right lower lobe lobar and segmental nearly occlusive pulmonary emboli, including on 91/15 and 99/17. No evidence of right heart strain. Mediastinum/Nodes: No mediastinal or hilar adenopathy. Lungs/Pleura: No pleural fluid. Moderate centrilobular and paraseptal emphysema. No pulmonary metastasis. Musculoskeletal: Degenerative changes of the left  acromioclavicular joint are advanced. Lytic lesion within the left side of the T12 vertebral body measures 2.3 cm on 151/15 and 103/19. CT ABDOMEN FINDINGS Hepatobiliary: Mild cirrhosis. Multifocal hepatocellular carcinoma. Dominant mass or masses throughout the majority of the right lobe. Example at 10.2 x 14.6 cm on 47/10. At the site of a 5.0 x 4.3 cm lesion on 07/15/2018 (when remeasured). Left-sided lesions including a segment 2 hypervascular mass at 4.5 x 3.3 cm, new on 55/10. Normal gallbladder, without biliary ductal dilatation. Pancreas: Calcifications within the pancreatic head may relate to chronic calcific pancreatitis. No duct dilatation or acute inflammation. Spleen: Normal in size, without focal abnormality. Adrenals/Urinary Tract: Normal adrenal glands. Normal kidneys, without hydronephrosis. Stomach/Bowel: Normal stomach, without wall thickening. Normal colon, appendix, and terminal ileum. Normal small bowel. Vascular/Lymphatic: Advanced aortic and branch vessel atherosclerosis. 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 Right hepatic vein thrombus including on 143/15. Prominent porta hepatis nodes are likely reactive in the setting of cirrhosis, grossly similar to on the prior MRI. Other: No ascites.  No evidence of omental or peritoneal disease. Musculoskeletal: Right-sided L4 osseous and soft tissue mass including at 5.5 x 5.0 cm on 217/15. L 4-5 grade 1 anterolisthesis. IMPRESSION: 1. Significant progression of multifocal bilateral hepatocellular carcinoma. 2. Osseous metastasis, including a dominant right-sided L4 lesion. Consider further evaluation with lumbar and possibly thoracic spine MRI, given the size of the dominant right-sided L4 lesion. 3. Right-sided pulmonary embolism, without evidence of right heart strain. 4. No evidence of metastatic disease within the chest. 5. 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 6. Aortic atherosclerosis  (ICD10-I70.0), coronary artery atherosclerosis and emphysema (ICD10-J43.9). These results will be called to the ordering clinician or representative by the Radiologist Assistant, and communication documented in the PACS or [employer]. Electronically Signed   By: Abigail Miyamoto M.D.   On: 04/24/2020 12:32        Scheduled Meds: . sodium chloride flush  3 mL Intravenous Once   Continuous Infusions: . heparin 1,050 Units/hr (04/25/20 0651)     LOS: 0 days   Time spent= 35 mins    Donzell Coller Arsenio Loader, MD [facility]  If 7PM-7AM, please contact night-coverage  04/25/2020, 8:27 AM

## 2020-04-25 NOTE — Progress Notes (Signed)
ANTICOAGULATION CONSULT NOTE  Pharmacy Consult for heparin Indication: pulmonary embolus  No Known Allergies  Patient Measurements: Height: 5\' 6"  (167.6 cm) Weight: 74.8 kg (165 lb) IBW/kg (Calculated) : 63.8 Heparin Dosing Weight: 74.8kg  Vital Signs: Temp: 98.3 F (36.8 C) (05/27 2122) Temp Source: Oral (05/27 2122) BP: 149/82 (05/27 2122) Pulse Rate: 96 (05/27 2122)  Labs: Recent Labs    04/24/20 1945 04/24/20 2216 04/25/20 0604 04/25/20 1419 04/25/20 2207  HGB 8.7*  --  8.5*  --   --   HCT 28.7*  --  28.0*  --   --   PLT 293  --  271  --   --   LABPROT  --  13.0  --   --   --   INR  --  1.0  --   --   --   HEPARINUNFRC  --   --  0.87* 0.87* 0.69  CREATININE 1.24  --   --   --   --     Estimated Creatinine Clearance: 56.5 mL/min (by C-G formula based on SCr of 1.24 mg/dL).  Assessment: 68 yom presented to the ED with acute PE. To start IV heparin for anticoagulation. Baseline Hgb is low at 8.7 and platelets are WNL. He is no on anticoagulation PTA.   Heparin level therapeutic (0.69) on gtt at 850 units/hr. No bleeding noted.  Goal of Therapy:  Heparin level 0.3-0.7 units/ml Monitor platelets by anticoagulation protocol: Yes   Plan:  Continue heparin at 850 units/hr.  F/u a.m. level to confirm therapeutic  Sherlon Handing, PharmD, BCPS Please see amion for complete clinical pharmacist phone list 04/25/2020,11:53 PM

## 2020-04-25 NOTE — ED Notes (Signed)
Pt being transported to MRI.

## 2020-04-25 NOTE — ED Notes (Signed)
Lunch Tray Ordered @ 1036. 

## 2020-04-25 NOTE — ED Notes (Signed)
Mri has called the admitting doctor needs to add mri w/o contrast also not just with

## 2020-04-25 NOTE — Consult Note (Addendum)
[place]  Telephone:(336) [phone] Fax:(336) [phone]   [place]  Referral MD: Dr. Gerlean Ren   Reason for Referral: Pulmonary embolism, multifocal hepatocellular carcinoma diagnosed in 2019 now with bone mets noted on CT scan  HPI: Vernon [name] is a 59 year old male with a past medical history significant for gout, hepatitis C, and hepatocellular carcinoma diagnosed in October 2019 via liver biopsy and was lost to follow-up.  He presented to the emergency room after being told by his primary care provider that he had an abnormal CT scan showing an acute PE.  The patient reports that for the past several months he has been experiencing right hip pain which has gradually worsened.  Pain is now severe, throbbing in quality, and radiating down the right thigh.  It is worse with movement.  He has also had generalized malaise and weakness for the past several months.  Due to worsening symptoms, he was seen by his primary care provider and given his history of [place], a CT of the chest, abdomen, pelvis was obtained.  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  Labs on admission showed a hemoglobin 8.7 and a sodium 134.  Hepatic function panel showed an albumin of 3.2, AST 103, ALT 81, alk phos 205, total bilirubin 1.3, direct bilirubin 0.4.  Patient had an MRI of the thoracic and lumbar spine performed earlier today and results are pending.  The patient was last seen by medical oncology at [facility] on 09/14/2018.  At this visit, his biopsy, lab, and imaging results were discussed with him.  Systemic treatment was discussed (sorafenib versus lenvatinib).  Case was to be discussed with Dr. Leamon Arnt  and further recommendations for treatment were to be determined based on the discussion.  The patient was to be called with plans for follow-up visit.  I do not see that the patient ever followed up at [facility] following this appointment and it appears as though he no showed for his appointment.  When seen today, the patient reports that he has right hip pain that radiates down his leg.  Pain seems to better controlled overall at this time.  He denies recent loss of appetite and weight loss.  Denies fevers and chills.  Denies headaches, dizziness, chest pain, shortness of breath, abdominal pain, nausea, vomiting, constipation, diarrhea.  He denies any recent bleeding.  The patient states that he was not aware of his cancer diagnosis in 2019.  He was recently furred to the liver clinic for management of his hepatitis C.  He has never received treatment for hepatitis C.  That he has never had a prior EGD.  The patient is single but lives with his girlfriend.  He has 3 children.  Denies alcohol use and has a history of smoking cigarettes x18 years but quit in 2019.  Reports family history of cancer in his mother and father but he is unsure what type of cancer they had.  Medical oncology was asked see the patient to make recommendations regarding his diagnosis of metastatic hepatocellular carcinoma and pulmonary embolism.   Past Medical History:  Diagnosis Date  . Gout   . Hepatitis C   . Hepatocellular carcinoma ([place])   :  History reviewed. No pertinent surgical history.:  Current Facility-Administered  Medications  Medication Dose Route Frequency Provider Last Rate Last Admin  . acetaminophen (TYLENOL) tablet 325 mg  325 mg Oral Q6H PRN Shalhoub, Sherryll Burger, MD       Or  . acetaminophen (TYLENOL) suppository 325 mg  325 mg Rectal Q6H PRN Shalhoub, Sherryll Burger, MD      . heparin ADULT infusion 100 units/mL (25000 units/227m sodium chloride 0.45%)  1,050 Units/hr Intravenous Continuous BLaren Everts RPH  10.5 mL/hr at 04/25/20 0651 1,050 Units/hr at 04/25/20 0651  . ipratropium-albuterol (DUONEB) 0.5-2.5 (3) MG/3ML nebulizer solution 3 mL  3 mL Nebulization Q4H PRN Amin, Ankit Chirag, MD      . oxyCODONE (Oxy IR/ROXICODONE) immediate release tablet 5 mg  5 mg Oral Q4H PRN Shalhoub, GSherryll Burger MD   5 mg at 04/25/20 1317   Or  . morphine 4 MG/ML injection 4 mg  4 mg Intravenous Q4H PRN Shalhoub, GSherryll Burger MD      . ondansetron (ZOFRAN) tablet 4 mg  4 mg Oral Q6H PRN Shalhoub, GSherryll Burger MD       Or  . ondansetron (ZOFRAN) injection 4 mg  4 mg Intravenous Q6H PRN Shalhoub, GSherryll Burger MD      . polyethylene glycol (MIRALAX / GLYCOLAX) packet 17 g  17 g Oral Daily PRN Shalhoub, GSherryll Burger MD      . senna-docusate (Senokot-S) tablet 2 tablet  2 tablet Oral QHS PRN Amin, Ankit Chirag, MD      . sodium chloride flush (NS) 0.9 % injection 3 mL  3 mL Intravenous Once Shalhoub, GSherryll Burger MD         No Known Allergies:  Family History  Problem Relation Age of Onset  . Cancer Mother   . Cancer Father   . Hypertension Maternal Uncle   . Healthy Daughter   . Healthy Son   :  Social History   Socioeconomic History  . Marital status: Single    Spouse name: Not on file  . Number of children: 3  . Years of education: 10  . Highest education level: 10th grade  Occupational History  . Occupation: CChief Operating Officer   Comment: unemployed (05/2018)  Tobacco Use  . Smoking status: Former Smoker    Years: 18.00    Types: Cigarettes    Quit date: 08/25/2018    Years since quitting: 1.6  . Smokeless tobacco: Never Used  . Tobacco comment: smokes about 3 cigarretes a month  Substance and Sexual Activity  . Alcohol use: Yes    Comment: occasional beer  . Drug use: Never  . Sexual activity: Not Currently  Other Topics Concern  . Not on file  Social History Narrative  . Not on file   Social Determinants of Health   Financial Resource Strain:   . Difficulty of Paying Living Expenses:   Food  Insecurity:   . Worried About RCharity fundraiserin the Last Year:   . RArboriculturistin the Last Year:   Transportation Needs:   . LFilm/video editor(Medical):   .Marland KitchenLack of Transportation (Non-Medical):   Physical Activity:   . Days of Exercise per Week:   . Minutes of Exercise per Session:   Stress:   . Feeling of Stress :   Social Connections:   . Frequency of Communication with Friends and Family:   . Frequency of Social Gatherings with Friends and Family:   . Attends Religious Services:   .  Active Member of Clubs or Organizations:   . Attends Archivist Meetings:   Marland Kitchen Marital Status:   Intimate Partner Violence:   . Fear of Current or Ex-Partner:   . Emotionally Abused:   Marland Kitchen Physically Abused:   . Sexually Abused:   :  Review of Systems: A comprehensive 14 point review of systems was negative except as noted in the HPI.  Exam: Patient Vitals for the past 24 hrs:  BP Temp Temp src Pulse Resp SpO2 Height Weight  04/25/20 1300 (!) 150/90 -- -- 93 17 100 % -- --  04/25/20 0900 (!) 159/95 -- -- (!) 102 19 98 % -- --  04/25/20 0815 (!) 148/87 -- -- -- (!) 22 -- -- --  04/25/20 0730 (!) 148/96 -- -- 86 (!) 21 99 % -- --  04/25/20 0600 (!) 141/89 -- -- 88 17 97 % -- --  04/25/20 0515 (!) 154/93 -- -- -- 14 -- -- --  04/25/20 0430 137/85 -- -- -- 11 -- -- --  04/25/20 0345 131/72 -- -- -- 15 -- -- --  04/25/20 0300 (!) 152/85 -- -- -- 19 -- -- --  04/24/20 2300 (!) 146/92 -- -- 90 (!) 24 97 % -- --  04/24/20 2215 (!) 145/84 -- -- 97 17 97 % -- --  04/24/20 2200 -- -- -- -- -- -- '5\' 6"'$  (1.676 m) 74.8 kg  04/24/20 2130 135/87 -- -- 90 16 98 % -- --  04/24/20 2100 (!) 148/83 -- -- 95 19 99 % -- --  04/24/20 1854 (!) 144/83 98.8 F (37.1 C) Oral 97 18 99 % -- --    General: Awake and alert, no distress.   Eyes:  no scleral icterus.   ENT:  There were no oropharyngeal lesions.   Neck was without thyromegaly.   Lymphatics:  Negative cervical, supraclavicular or  axillary adenopathy.   Respiratory: lungs were clear bilaterally without wheezing or crackles.   Cardiovascular:  Regular rate and rhythm, S1/S2, without murmur, rub or gallop.  There was no pedal edema.   GI:  abdomen was soft, flat, nontender, nondistended, without organomegaly.   Musculoskeletal: Strength 5/5 in all extremities Skin exam was without echymosis, petichae.   Neuro exam was nonfocal. Patient was alert and oriented.  Attention was good.   Language was appropriate.  Mood was normal without depression.  Speech was not pressured.  Thought content was not tangential.     Lab Results  Component Value Date   WBC 5.8 04/25/2020   HGB 8.5 (L) 04/25/2020   HCT 28.0 (L) 04/25/2020   PLT 271 04/25/2020   GLUCOSE 85 04/24/2020   CHOL 106 06/13/2018   TRIG 101 06/13/2018   HDL 59 06/13/2018   LDLCALC 29 06/13/2018   ALT 81 (H) 04/24/2020   AST 103 (H) 04/24/2020   NA 134 (L) 04/24/2020   K 4.3 04/24/2020   CL 103 04/24/2020   CREATININE 1.24 04/24/2020   BUN 11 04/24/2020   CO2 22 04/24/2020    CT CHEST W CONTRAST  Result Date: 04/24/2020 CLINICAL DATA:  Hepatocellular carcinoma, restaging. EXAM: CT CHEST WITH CONTRAST CT ABDOMEN WITH AND WITHOUT CONTRAST TECHNIQUE: Multidetector CT imaging of the chest was performed during intravenous contrast administration. Multidetector CT imaging of the abdomen was performed following the standard protocol before and during bolus administration of intravenous contrast. CONTRAST:  139m ISOVUE-300 IOPAMIDOL (ISOVUE-300) INJECTION 61% COMPARISON:  PET of 08/11/2018. Abdominal MRI of 07/15/2018. No prior  chest CT. FINDINGS: CT CHEST FINDINGS Cardiovascular: Aortic atherosclerosis. Tortuous thoracic aorta. Normal heart size, without pericardial effusion. Lad coronary artery calcification. Right lower lobe lobar and segmental nearly occlusive pulmonary emboli, including on 91/15 and 99/17. No evidence of right heart strain. Mediastinum/Nodes: No  mediastinal or hilar adenopathy. Lungs/Pleura: No pleural fluid. Moderate centrilobular and paraseptal emphysema. No pulmonary metastasis. Musculoskeletal: Degenerative changes of the left acromioclavicular joint are advanced. Lytic lesion within the left side of the T12 vertebral body measures 2.3 cm on 151/15 and 103/19. CT ABDOMEN FINDINGS Hepatobiliary: Mild cirrhosis. Multifocal hepatocellular carcinoma. Dominant mass or masses throughout the majority of the right lobe. Example at 10.2 x 14.6 cm on 47/10. At the site of a 5.0 x 4.3 cm lesion on 07/15/2018 (when remeasured). Left-sided lesions including a segment 2 hypervascular mass at 4.5 x 3.3 cm, new on 55/10. Normal gallbladder, without biliary ductal dilatation. Pancreas: Calcifications within the pancreatic head may relate to chronic calcific pancreatitis. No duct dilatation or acute inflammation. Spleen: Normal in size, without focal abnormality. Adrenals/Urinary Tract: Normal adrenal glands. Normal kidneys, without hydronephrosis. Stomach/Bowel: Normal stomach, without wall thickening. Normal colon, appendix, and terminal ileum. Normal small bowel. Vascular/Lymphatic: Advanced aortic and branch vessel atherosclerosis. 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 Right hepatic vein thrombus including on 143/15. Prominent porta hepatis nodes are likely reactive in the setting of cirrhosis, grossly similar to on the prior MRI. Other: No ascites.  No evidence of omental or peritoneal disease. Musculoskeletal: Right-sided L4 osseous and soft tissue mass including at 5.5 x 5.0 cm on 217/15. L 4-5 grade 1 anterolisthesis. IMPRESSION: 1. Significant progression of multifocal bilateral hepatocellular carcinoma. 2. Osseous metastasis, including a dominant right-sided L4 lesion. Consider further evaluation with lumbar and possibly thoracic spine MRI, given the size of the dominant right-sided L4 lesion. 3. Right-sided pulmonary embolism, without  evidence of right heart strain. 4. No evidence of metastatic disease within the chest. 5. 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 6. Aortic atherosclerosis (ICD10-I70.0), coronary artery atherosclerosis and emphysema (ICD10-J43.9). These results will be called to the ordering clinician or representative by the Radiologist Assistant, and communication documented in the PACS or [employer]. Electronically Signed   By: Abigail Miyamoto M.D.   On: 04/24/2020 12:32   CT ABDOMEN W WO CONTRAST  Result Date: 04/24/2020 CLINICAL DATA:  Hepatocellular carcinoma, restaging. EXAM: CT CHEST WITH CONTRAST CT ABDOMEN WITH AND WITHOUT CONTRAST TECHNIQUE: Multidetector CT imaging of the chest was performed during intravenous contrast administration. Multidetector CT imaging of the abdomen was performed following the standard protocol before and during bolus administration of intravenous contrast. CONTRAST:  165m ISOVUE-300 IOPAMIDOL (ISOVUE-300) INJECTION 61% COMPARISON:  PET of 08/11/2018. Abdominal MRI of 07/15/2018. No prior chest CT. FINDINGS: CT CHEST FINDINGS Cardiovascular: Aortic atherosclerosis. Tortuous thoracic aorta. Normal heart size, without pericardial effusion. Lad coronary artery calcification. Right lower lobe lobar and segmental nearly occlusive pulmonary emboli, including on 91/15 and 99/17. No evidence of right heart strain. Mediastinum/Nodes: No mediastinal or hilar adenopathy. Lungs/Pleura: No pleural fluid. Moderate centrilobular and paraseptal emphysema. No pulmonary metastasis. Musculoskeletal: Degenerative changes of the left acromioclavicular joint are advanced. Lytic lesion within the left side of the T12 vertebral body measures 2.3 cm on 151/15 and 103/19. CT ABDOMEN FINDINGS Hepatobiliary: Mild cirrhosis. Multifocal hepatocellular carcinoma. Dominant mass or masses throughout the majority of the right lobe. Example at 10.2 x 14.6 cm on 47/10. At the site of a 5.0 x  4.3 cm lesion  on 07/15/2018 (when remeasured). Left-sided lesions including a segment 2 hypervascular mass at 4.5 x 3.3 cm, new on 55/10. Normal gallbladder, without biliary ductal dilatation. Pancreas: Calcifications within the pancreatic head may relate to chronic calcific pancreatitis. No duct dilatation or acute inflammation. Spleen: Normal in size, without focal abnormality. Adrenals/Urinary Tract: Normal adrenal glands. Normal kidneys, without hydronephrosis. Stomach/Bowel: Normal stomach, without wall thickening. Normal colon, appendix, and terminal ileum. Normal small bowel. Vascular/Lymphatic: Advanced aortic and branch vessel atherosclerosis. 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 Right hepatic vein thrombus including on 143/15. Prominent porta hepatis nodes are likely reactive in the setting of cirrhosis, grossly similar to on the prior MRI. Other: No ascites.  No evidence of omental or peritoneal disease. Musculoskeletal: Right-sided L4 osseous and soft tissue mass including at 5.5 x 5.0 cm on 217/15. L 4-5 grade 1 anterolisthesis. IMPRESSION: 1. Significant progression of multifocal bilateral hepatocellular carcinoma. 2. Osseous metastasis, including a dominant right-sided L4 lesion. Consider further evaluation with lumbar and possibly thoracic spine MRI, given the size of the dominant right-sided L4 lesion. 3. Right-sided pulmonary embolism, without evidence of right heart strain. 4. No evidence of metastatic disease within the chest. 5. 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 6. Aortic atherosclerosis (ICD10-I70.0), coronary artery atherosclerosis and emphysema (ICD10-J43.9). These results will be called to the ordering clinician or representative by the Radiologist Assistant, and communication documented in the PACS or [employer]. Electronically Signed   By: Abigail Miyamoto M.D.   On: 04/24/2020 12:32     CT CHEST W CONTRAST  Result Date:  04/24/2020 CLINICAL DATA:  Hepatocellular carcinoma, restaging. EXAM: CT CHEST WITH CONTRAST CT ABDOMEN WITH AND WITHOUT CONTRAST TECHNIQUE: Multidetector CT imaging of the chest was performed during intravenous contrast administration. Multidetector CT imaging of the abdomen was performed following the standard protocol before and during bolus administration of intravenous contrast. CONTRAST:  163m ISOVUE-300 IOPAMIDOL (ISOVUE-300) INJECTION 61% COMPARISON:  PET of 08/11/2018. Abdominal MRI of 07/15/2018. No prior chest CT. FINDINGS: CT CHEST FINDINGS Cardiovascular: Aortic atherosclerosis. Tortuous thoracic aorta. Normal heart size, without pericardial effusion. Lad coronary artery calcification. Right lower lobe lobar and segmental nearly occlusive pulmonary emboli, including on 91/15 and 99/17. No evidence of right heart strain. Mediastinum/Nodes: No mediastinal or hilar adenopathy. Lungs/Pleura: No pleural fluid. Moderate centrilobular and paraseptal emphysema. No pulmonary metastasis. Musculoskeletal: Degenerative changes of the left acromioclavicular joint are advanced. Lytic lesion within the left side of the T12 vertebral body measures 2.3 cm on 151/15 and 103/19. CT ABDOMEN FINDINGS Hepatobiliary: Mild cirrhosis. Multifocal hepatocellular carcinoma. Dominant mass or masses throughout the majority of the right lobe. Example at 10.2 x 14.6 cm on 47/10. At the site of a 5.0 x 4.3 cm lesion on 07/15/2018 (when remeasured). Left-sided lesions including a segment 2 hypervascular mass at 4.5 x 3.3 cm, new on 55/10. Normal gallbladder, without biliary ductal dilatation. Pancreas: Calcifications within the pancreatic head may relate to chronic calcific pancreatitis. No duct dilatation or acute inflammation. Spleen: Normal in size, without focal abnormality. Adrenals/Urinary Tract: Normal adrenal glands. Normal kidneys, without hydronephrosis. Stomach/Bowel: Normal stomach, without wall thickening. Normal colon,  appendix, and terminal ileum. Normal small bowel. Vascular/Lymphatic: Advanced aortic and branch vessel atherosclerosis. 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 Right hepatic vein thrombus including on 143/15. Prominent porta hepatis nodes are likely reactive in the setting of cirrhosis, grossly similar to on the prior MRI. Other: No ascites.  No evidence of omental or peritoneal disease.  Musculoskeletal: Right-sided L4 osseous and soft tissue mass including at 5.5 x 5.0 cm on 217/15. L 4-5 grade 1 anterolisthesis. IMPRESSION: 1. Significant progression of multifocal bilateral hepatocellular carcinoma. 2. Osseous metastasis, including a dominant right-sided L4 lesion. Consider further evaluation with lumbar and possibly thoracic spine MRI, given the size of the dominant right-sided L4 lesion. 3. Right-sided pulmonary embolism, without evidence of right heart strain. 4. No evidence of metastatic disease within the chest. 5. 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 6. Aortic atherosclerosis (ICD10-I70.0), coronary artery atherosclerosis and emphysema (ICD10-J43.9). These results will be called to the ordering clinician or representative by the Radiologist Assistant, and communication documented in the PACS or [employer]. Electronically Signed   By: Abigail Miyamoto M.D.   On: 04/24/2020 12:32   CT ABDOMEN W WO CONTRAST  Result Date: 04/24/2020 CLINICAL DATA:  Hepatocellular carcinoma, restaging. EXAM: CT CHEST WITH CONTRAST CT ABDOMEN WITH AND WITHOUT CONTRAST TECHNIQUE: Multidetector CT imaging of the chest was performed during intravenous contrast administration. Multidetector CT imaging of the abdomen was performed following the standard protocol before and during bolus administration of intravenous contrast. CONTRAST:  19m ISOVUE-300 IOPAMIDOL (ISOVUE-300) INJECTION 61% COMPARISON:  PET of 08/11/2018. Abdominal MRI of 07/15/2018. No prior chest CT. FINDINGS:  CT CHEST FINDINGS Cardiovascular: Aortic atherosclerosis. Tortuous thoracic aorta. Normal heart size, without pericardial effusion. Lad coronary artery calcification. Right lower lobe lobar and segmental nearly occlusive pulmonary emboli, including on 91/15 and 99/17. No evidence of right heart strain. Mediastinum/Nodes: No mediastinal or hilar adenopathy. Lungs/Pleura: No pleural fluid. Moderate centrilobular and paraseptal emphysema. No pulmonary metastasis. Musculoskeletal: Degenerative changes of the left acromioclavicular joint are advanced. Lytic lesion within the left side of the T12 vertebral body measures 2.3 cm on 151/15 and 103/19. CT ABDOMEN FINDINGS Hepatobiliary: Mild cirrhosis. Multifocal hepatocellular carcinoma. Dominant mass or masses throughout the majority of the right lobe. Example at 10.2 x 14.6 cm on 47/10. At the site of a 5.0 x 4.3 cm lesion on 07/15/2018 (when remeasured). Left-sided lesions including a segment 2 hypervascular mass at 4.5 x 3.3 cm, new on 55/10. Normal gallbladder, without biliary ductal dilatation. Pancreas: Calcifications within the pancreatic head may relate to chronic calcific pancreatitis. No duct dilatation or acute inflammation. Spleen: Normal in size, without focal abnormality. Adrenals/Urinary Tract: Normal adrenal glands. Normal kidneys, without hydronephrosis. Stomach/Bowel: Normal stomach, without wall thickening. Normal colon, appendix, and terminal ileum. Normal small bowel. Vascular/Lymphatic: Advanced aortic and branch vessel atherosclerosis. 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 Right hepatic vein thrombus including on 143/15. Prominent porta hepatis nodes are likely reactive in the setting of cirrhosis, grossly similar to on the prior MRI. Other: No ascites.  No evidence of omental or peritoneal disease. Musculoskeletal: Right-sided L4 osseous and soft tissue mass including at 5.5 x 5.0 cm on 217/15. L 4-5 grade 1 anterolisthesis.  IMPRESSION: 1. Significant progression of multifocal bilateral hepatocellular carcinoma. 2. Osseous metastasis, including a dominant right-sided L4 lesion. Consider further evaluation with lumbar and possibly thoracic spine MRI, given the size of the dominant right-sided L4 lesion. 3. Right-sided pulmonary embolism, without evidence of right heart strain. 4. No evidence of metastatic disease within the chest. 5. 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 6. Aortic atherosclerosis (ICD10-I70.0), coronary artery atherosclerosis and emphysema (ICD10-J43.9). These results will be called to the ordering clinician or representative by the Radiologist Assistant, and communication documented in the PACS or [employer] Electronically Signed   By: KMarylyn Ishihara  Jobe Igo M.D.   On: 04/24/2020 12:32    Pathology:  Surgical Pathology  CASE: [id number]  PATIENT: Vernon [name]  Surgical Pathology Report   SPECIMEN SUBMITTED:  A. Liver mass; bx   CLINICAL HISTORY:  History of HCC, now with multiple liver lesions and masses worrisome for  multifocal HCC, AFP elevated   PRE-OPERATIVE DIAGNOSIS:  HCC   POST-OPERATIVE DIAGNOSIS:  Same as above   DIAGNOSIS:  A. LIVER MASS; ULTRASOUND-GUIDED BIOPSY:  - HEPATOCELLULAR CARCINOMA, MODERATELY DIFFERENTIATED (WHO 2019).  - BACKGROUND LIVER WITH FOCALLY INTENSE LYMPHOPLASMACYTIC INFILTRATE AND  AREAS WORRISOME FOR PARENCHYMAL NECROSIS, SEE COMMENT.   Comment:  The patient's history of hepatitis C is noted. The background liver  demonstrates areas of intense lymphoplasmacytic inflammation with brisk  piecemeal necrosis. Areas worrisome for parenchymal necrosis are  present. These findings raise the possibility of an underlying and  concurrent autoimmune hepatitis. Recommend autoimmune hepatitis  serologies (e.g. ANA). These findings werediscussed with Dr. Mike Gip  on 09/09/2018.   Assessment and Plan:  1.  Multifocal  hepatocellular carcinoma diagnosed in October 2019 now with progressive disease and bone mets 2.  Pulmonary embolism 3.  Elevated LFTs secondary to #1 4.  Anemia 5.  Gout  -Discussed the patient's prior biopsy and CT scan results.  We discussed that he has hepatocellular carcinoma which has progressed since 2019 and now appears to have bone metastasis. -Await results of the MRI of the thoracic and lumbar spine.  May need to consider palliative radiation to his bone lesions. -Obtain AFP in a.m. -We can consider him for treatment for his [facility] following discharge.  He prefers to follow-up in [place]. -Discussed anticoagulation options with the patient including Lovenox versus DOAC.  The patient is not interested in giving himself injections and therefore can transition him to Eliquis or Xarelto prior to discharge. -Will need an EGD to evaluate for esophageal varices, but this can likely be done as an outpatient. -The patient is anemic likely due to underlying liver disease and untreated malignancy.  Globin has been stable since admission.  Transfuse for hemoglobin less than 7.  Thank you for this referral.   Mikey Bussing, DNP, AGPCNP-BC, AOCNP  Addendum I have seen the patient, examined him. I agree with the assessment and and plan and have edited the notes.   I have reviewed his CT and MRI and discussed the findings with pt. He unfortunately lost f/u after his [place] diagnosis about two years ago, and now has developed multiple bone mets, with largest 6cm mass involving the right aspect of the L4 vertebral body with paravertebral and epidural tumor extension, causing significant right buttock and hip pain.  CT scan showed cancer progression in the liver and bone mets, no other distant metastasis. Given the typical image findings, I do not think biopsy is necessary. I recommend palliative radiation to the L4 lesion, I will refer him to our radiation oncology department for an urgent consult early  next week.  I discussed the systemic therapy option, including TKIs such as sorafenib or lenvatinib, or immunotherapy with or without bevacizumab.  Also I recommend intravenous Tecentriq and bevacizumab as first-line therapy, patient is strongly in favor of oral treatment. I will prescribe lenvatinib then. Plan to start after he complete RT.   We discussed anticoagulation for his PE and portal vein thrombosis.  He declines Lovenox injection.  Due to his underlying liver cirrhosis, Coumadin monitoring may be problematic in the future, I would recommend Xarelto or Eliquis on discharge.  He has moderate anemia and iron deficiency, he has received one dose iv feraheme, will arrange second dose in my office. Will also refer him to GI for EGD after discharge. We discussed high risk of bleeding from anticoagulation. Will monitor closely. I will see him back in my office next week. Pt wishes to f/u with [place] at [facility] office.   Truitt Merle  04/25/2020

## 2020-04-25 NOTE — ED Notes (Signed)
Admitting doctor at  The bedside 

## 2020-04-25 NOTE — Progress Notes (Signed)
ANTICOAGULATION CONSULT NOTE  Pharmacy Consult for heparin Indication: pulmonary embolus  No Known Allergies  Patient Measurements: Height: 5\' 6"  (167.6 cm) Weight: 74.8 kg (165 lb) IBW/kg (Calculated) : 63.8 Heparin Dosing Weight: 74.8kg  Vital Signs: BP: 150/90 (05/27 1300) Pulse Rate: 93 (05/27 1300)  Labs: Recent Labs    04/24/20 1945 04/24/20 2216 04/25/20 0604 04/25/20 1419  HGB 8.7*  --  8.5*  --   HCT 28.7*  --  28.0*  --   PLT 293  --  271  --   LABPROT  --  13.0  --   --   INR  --  1.0  --   --   HEPARINUNFRC  --   --  0.87* 0.87*  CREATININE 1.24  --   --   --     Estimated Creatinine Clearance: 56.5 mL/min (by C-G formula based on SCr of 1.24 mg/dL).   Medical History: Past Medical History:  Diagnosis Date  . Gout   . Hepatitis C   . Hepatocellular carcinoma (HCC)     Medications:  Infusions:  . heparin 1,050 Units/hr (04/25/20 [id number])    Assessment: Vernon [name] presented to the ED with acute PE. To start IV heparin for anticoagulation. Baseline Hgb is low at 8.7 and platelets are WNL. He is no on anticoagulation PTA.   Heparin level remains slightly elevated at 0.87 after reduction to 1050 units/hr.  Hemoglobin is low stable. Platelets are within normal limits. No bleeding noted.   Goal of Therapy:  Heparin level 0.3-0.7 units/ml Monitor platelets by anticoagulation protocol: Yes   Plan:  Reduce Heparin further to 850 units/hr.  Check a 6 hr heparin level Daily heparin level and CBC  Sloan Leiter, PharmD, BCPS, BCCCP Clinical Pharmacist Please refer to [facility] for [place] numbers 04/25/2020,3:04 PM

## 2020-04-25 NOTE — ED Notes (Signed)
Pt is in MRI  

## 2020-04-26 ENCOUNTER — Other Ambulatory Visit: Payer: Self-pay | Admitting: Hematology

## 2020-04-26 ENCOUNTER — Telehealth: Payer: Self-pay | Admitting: Pharmacist

## 2020-04-26 DIAGNOSIS — Z515 Encounter for palliative care: Secondary | ICD-10-CM

## 2020-04-26 DIAGNOSIS — D500 Iron deficiency anemia secondary to blood loss (chronic): Secondary | ICD-10-CM | POA: Insufficient documentation

## 2020-04-26 DIAGNOSIS — Z7189 Other specified counseling: Secondary | ICD-10-CM

## 2020-04-26 DIAGNOSIS — M25551 Pain in right hip: Secondary | ICD-10-CM

## 2020-04-26 DIAGNOSIS — G893 Neoplasm related pain (acute) (chronic): Secondary | ICD-10-CM

## 2020-04-26 LAB — COMPREHENSIVE METABOLIC PANEL
ALT: 76 U/L — ABNORMAL HIGH (ref 0–44)
AST: 106 U/L — ABNORMAL HIGH (ref 15–41)
Albumin: 2.9 g/dL — ABNORMAL LOW (ref 3.5–5.0)
Alkaline Phosphatase: 200 U/L — ABNORMAL HIGH (ref 38–126)
Anion gap: 8 (ref 5–15)
BUN: 9 mg/dL (ref 8–23)
CO2: 23 mmol/L (ref 22–32)
Calcium: 9.1 mg/dL (ref 8.9–10.3)
Chloride: 106 mmol/L (ref 98–111)
Creatinine, Ser: 1.19 mg/dL (ref 0.61–1.24)
GFR calc Af Amer: >60 mL/min (ref >60)
GFR calc non Af Amer: >60 mL/min (ref >60)
Glucose, Bld: 98 mg/dL (ref 70–99)
Potassium: 4.6 mmol/L (ref 3.5–5.1)
Sodium: 137 mmol/L (ref 135–145)
Total Bilirubin: 0.9 mg/dL (ref 0.3–1.2)
Total Protein: 7 g/dL (ref 6.5–8.1)

## 2020-04-26 LAB — CBC
HCT: 28.3 % — ABNORMAL LOW (ref 39.0–52.0)
Hemoglobin: 8.4 g/dL — ABNORMAL LOW (ref 13.0–17.0)
MCH: 26.9 pg (ref 26.0–34.0)
MCHC: 29.7 g/dL — ABNORMAL LOW (ref 30.0–36.0)
MCV: 90.7 fL (ref 80.0–100.0)
Platelets: 260 10*3/uL (ref 150–400)
RBC: 3.12 MIL/uL — ABNORMAL LOW (ref 4.22–5.81)
RDW: 15.1 % (ref 11.5–15.5)
WBC: 6.3 10*3/uL (ref 4.0–10.5)
nRBC: 0 % (ref 0.0–0.2)

## 2020-04-26 LAB — MAGNESIUM: Magnesium: 1.8 mg/dL (ref 1.7–2.4)

## 2020-04-26 LAB — HEPARIN LEVEL (UNFRACTIONATED): Heparin Unfractionated: 0.57 IU/mL (ref 0.30–0.70)

## 2020-04-26 MED ORDER — APIXABAN 5 MG PO TABS
10.0000 mg | ORAL_TABLET | Freq: Two times a day (BID) | ORAL | Status: DC
  Administered 2020-04-26: 10 mg via ORAL
  Filled 2020-04-26: qty 2

## 2020-04-26 MED ORDER — APIXABAN 5 MG PO TABS: ORAL_TABLET | ORAL | 0 refills | Status: DC

## 2020-04-26 MED ORDER — DEXAMETHASONE 4 MG PO TABS
2.0000 mg | ORAL_TABLET | Freq: Two times a day (BID) | ORAL | Status: DC
  Administered 2020-04-26: 2 mg via ORAL
  Filled 2020-04-26: qty 1

## 2020-04-26 MED ORDER — DEXAMETHASONE 2 MG PO TABS: 2.0000 mg | ORAL_TABLET | Freq: Two times a day (BID) | ORAL | 0 refills | Status: DC

## 2020-04-26 MED ORDER — POLYETHYLENE GLYCOL 3350 17 G PO PACK: 17.0000 g | PACK | Freq: Every day | ORAL | 0 refills | Status: AC | PRN

## 2020-04-26 MED ORDER — OXYCODONE HCL 5 MG PO TABS: 5.0000 mg | ORAL_TABLET | ORAL | 0 refills | Status: DC | PRN

## 2020-04-26 MED ORDER — OXYCODONE HCL ER 10 MG PO T12A: 10.0000 mg | EXTENDED_RELEASE_TABLET | Freq: Two times a day (BID) | ORAL | 0 refills | Status: DC

## 2020-04-26 MED ORDER — APIXABAN 5 MG PO TABS: 5.0000 mg | ORAL_TABLET | Freq: Two times a day (BID) | ORAL | Status: DC

## 2020-04-26 MED ORDER — SENNOSIDES-DOCUSATE SODIUM 8.6-50 MG PO TABS: 2.0000 | ORAL_TABLET | Freq: Every evening | ORAL | 0 refills | Status: DC | PRN

## 2020-04-26 MED ORDER — OXYCODONE HCL ER 10 MG PO T12A
10.0000 mg | EXTENDED_RELEASE_TABLET | Freq: Two times a day (BID) | ORAL | Status: DC
  Administered 2020-04-26: 10 mg via ORAL
  Filled 2020-04-26: qty 1

## 2020-04-26 MED ORDER — APIXABAN (ELIQUIS) EDUCATION KIT FOR DVT/PE PATIENTS
PACK | Freq: Once | Status: DC
  Filled 2020-04-26: qty 1

## 2020-04-26 MED ORDER — FERROUS SULFATE 325 (65 FE) MG PO TABS: 325.0000 mg | ORAL_TABLET | Freq: Two times a day (BID) | ORAL | 0 refills | Status: DC

## 2020-04-26 MED FILL — SENEXON-S 8.6-50 MG TABS: 8.6-50 | 15 days supply | Qty: 30 | Fill #0

## 2020-04-26 MED FILL — ELIQUIS 5 MG TABLET: 5 | 30 days supply | Qty: 74 | Fill #0

## 2020-04-26 MED FILL — FERROUS SULFATE 325 MG TAB: 325 (65 FE) | 30 days supply | Qty: 60 | Fill #0

## 2020-04-26 MED FILL — DEXAMETHASONE 2 MG TABLET: 2 | 15 days supply | Qty: 30 | Fill #0

## 2020-04-26 MED FILL — oxyCODONE HCL 5 MG TABS: 5 | 5 days supply | Qty: 30 | Fill #0

## 2020-04-26 MED FILL — OxyCONTIN 10 MG T12A: 10 | 7 days supply | Qty: 14 | Fill #0

## 2020-04-26 MED FILL — POLYETHYLENE GLYCOL 3350 PO: 17 | 14 days supply | Qty: 238 | Fill #0

## 2020-04-26 NOTE — Progress Notes (Addendum)
ANTICOAGULATION CONSULT NOTE  Pharmacy Consult for heparin Indication: pulmonary embolus  No Known Allergies  Patient Measurements: Height: 5\' 6"  (167.6 cm) Weight: 74.8 kg (165 lb) IBW/kg (Calculated) : 63.8 Heparin Dosing Weight: 74.8kg  Vital Signs: Temp: 98.3 F (36.8 C) (05/27 2122) Temp Source: Oral (05/27 2122) BP: 149/82 (05/27 2122) Pulse Rate: 96 (05/27 2122)  Labs: Recent Labs    04/24/20 1945 04/24/20 1945 04/24/20 2216 04/25/20 0604 04/25/20 0604 04/25/20 1419 04/25/20 2207 04/26/20 0216  HGB 8.7*   < >  --  8.5*  --   --   --  8.4*  HCT 28.7*  --   --  28.0*  --   --   --  28.3*  PLT 293  --   --  271  --   --   --  260  LABPROT  --   --  13.0  --   --   --   --   --   INR  --   --  1.0  --   --   --   --   --   HEPARINUNFRC  --   --   --  0.87*   < > 0.87* 0.69 0.57  CREATININE 1.24  --   --   --   --   --   --  1.19   < > = values in this interval not displayed.    Estimated Creatinine Clearance: 58.8 mL/min (by C-G formula based on SCr of 1.19 mg/dL).   Medical History: Past Medical History:  Diagnosis Date  . Gout   . Hepatitis C   . Hepatocellular carcinoma (HCC)     Medications:  Infusions:  . heparin 850 Units/hr (04/25/20 1500)    Assessment: 53 yom presented to the ED with acute PE. To start IV heparin for anticoagulation. Baseline Hgb is low at 8.7 and platelets are WNL. He is no on anticoagulation PTA.   Heparin level is now therapeutic x2 levels at  850 units/hr.  Hemoglobin is low stable. Platelets are within normal limits. No bleeding noted.  Goal of Therapy:  Heparin level 0.3-0.7 units/ml Monitor platelets by anticoagulation protocol: Yes   Plan:  Continue Heparin at 850 units/hr.  Daily heparin level and CBC  Sloan Leiter, PharmD, BCPS, BCCCP Clinical Pharmacist Please refer to [facility] for [place] numbers 04/26/2020,7:08 AM   Addendum: Pharmacy consult to change from IV Heparin to Apixaban.   Note  patient with history of hepatitis and hepatocellular carcinoma and Apixaban is 75% eliminated by liver: LFTs are slightly bumped so will need to monitor. Child-Pugh score A currently, so ok for Apixaban at this time.  SCr is stable at 1.19. CBC stable.   Plan: Start Apixaban 10mg  po BID x7 days then Apixaban 5mg  po BID Discontinue Heparin AT THE SAME TIME the first dose of Apixaban is given.  Monitor CBC, renal function, and LFTs  Sloan Leiter, PharmD, BCPS, BCCCP Clinical Pharmacist Please refer to [facility] for [place] numbers 8:14 AM, 04/26/2020

## 2020-04-26 NOTE — Discharge Instructions (Addendum)
Apixaban oral tablets What is this medicine? APIXABAN (a PIX a ban) is an anticoagulant (blood thinner). It is used to lower the chance of stroke in people with a medical condition called atrial fibrillation. It is also used to treat or prevent blood clots in the lungs or in the veins. This medicine may be used for other purposes; ask your health care provider or pharmacist if you have questions. COMMON BRAND NAME(S): Eliquis What should I tell my health care provider before I take this medicine? They need to know if you have any of these conditions:  antiphospholipid antibody syndrome  bleeding disorders  bleeding in the brain  blood in your stools (black or tarry stools) or if you have blood in your vomit  history of blood clots  history of stomach bleeding  kidney disease  liver disease  mechanical heart valve  an unusual or allergic reaction to apixaban, other medicines, foods, dyes, or preservatives  pregnant or trying to get pregnant  breast-feeding How should I use this medicine? Take this medicine by mouth with a glass of water. Follow the directions on the prescription label. You can take it with or without food. If it upsets your stomach, take it with food. Take your medicine at regular intervals. Do not take it more often than directed. Do not stop taking except on your doctor's advice. Stopping this medicine may increase your risk of a blood clot. Be sure to refill your prescription before you run out of medicine. Talk to your pediatrician regarding the use of this medicine in children. Special care may be needed. Overdosage: If you think you have taken too much of this medicine contact a poison control center or emergency room at once. NOTE: This medicine is only for you. Do not share this medicine with others. What if I miss a dose? If you miss a dose, take it as soon as you can. If it is almost time for your next dose, take only that dose. Do not take double or  extra doses. What may interact with this medicine? This medicine may interact with the following:  aspirin and aspirin-like medicines  certain medicines for fungal infections like ketoconazole and itraconazole  certain medicines for seizures like carbamazepine and phenytoin  certain medicines that treat or prevent blood clots like warfarin, enoxaparin, and dalteparin  clarithromycin  NSAIDs, medicines for pain and inflammation, like ibuprofen or naproxen  rifampin  ritonavir  St. John's wort This list may not describe all possible interactions. Give your health care provider a list of all the medicines, herbs, non-prescription drugs, or dietary supplements you use. Also tell them if you smoke, drink alcohol, or use illegal drugs. Some items may interact with your medicine. What should I watch for while using this medicine? Visit your healthcare professional for regular checks on your progress. You may need blood work done while you are taking this medicine. Your condition will be monitored carefully while you are receiving this medicine. It is important not to miss any appointments. Avoid sports and activities that might cause injury while you are using this medicine. Severe falls or injuries can cause unseen bleeding. Be careful when using sharp tools or knives. Consider using an electric razor. Take special care brushing or flossing your teeth. Report any injuries, bruising, or red spots on the skin to your healthcare professional. If you are going to need surgery or other procedure, tell your healthcare professional that you are taking this medicine. Wear a medical ID bracelet   or chain. Carry a card that describes your disease and details of your medicine and dosage times. What side effects may I notice from receiving this medicine? Side effects that you should report to your doctor or health care professional as soon as possible:  allergic reactions like skin rash, itching or hives,  swelling of the face, lips, or tongue  signs and symptoms of bleeding such as bloody or black, tarry stools; red or dark-brown urine; spitting up blood or brown material that looks like coffee grounds; red spots on the skin; unusual bruising or bleeding from the eye, gums, or nose  signs and symptoms of a blood clot such as chest pain; shortness of breath; pain, swelling, or warmth in the leg  signs and symptoms of a stroke such as changes in vision; confusion; trouble speaking or understanding; severe headaches; sudden numbness or weakness of the face, arm or leg; trouble walking; dizziness; loss of coordination This list may not describe all possible side effects. Call your doctor for medical advice about side effects. You may report side effects to FDA at 1-800-FDA-1088. Where should I keep my medicine? Keep out of the reach of children. Store at room temperature between 20 and 25 degrees C (68 and 77 degrees F). Throw away any unused medicine after the expiration date. NOTE: This sheet is a summary. It may not cover all possible information. If you have questions about this medicine, talk to your doctor, pharmacist, or health care provider.  2020 Elsevier/Gold Standard (2018-07-27 17:39:34)  Information on my medicine - ELIQUIS (apixaban)  This medication education was reviewed with me or my healthcare representative as part of my discharge preparation.  The pharmacist that spoke with me during my hospital stay was: Sloan Leiter, PharmD  Why was Eliquis prescribed for you? Eliquis was prescribed to treat blood clots that may have been found in the veins of your legs (deep vein thrombosis) or in your lungs (pulmonary embolism) and to reduce the risk of them occurring again.  What do You need to know about Eliquis ? The starting dose is 10 mg (two 5 mg tablets) taken TWICE daily for the FIRST SEVEN (7) DAYS, then on Friday June 4th, 2021  the dose is reduced to ONE 5 mg tablet taken  TWICE daily.  Eliquis may be taken with or without food.   Try to take the dose about the same time in the morning and in the evening. If you have difficulty swallowing the tablet whole please discuss with your pharmacist how to take the medication safely.  Take Eliquis exactly as prescribed and DO NOT stop taking Eliquis without talking to the doctor who prescribed the medication.  Stopping may increase your risk of developing a new blood clot.  Refill your prescription before you run out.  After discharge, you should have regular check-up appointments with your healthcare provider that is prescribing your Eliquis.    What do you do if you miss a dose? If a dose of ELIQUIS is not taken at the scheduled time, take it as soon as possible on the same day and twice-daily administration should be resumed. The dose should not be doubled to make up for a missed dose.  Important Safety Information A possible side effect of Eliquis is bleeding. You should call your healthcare provider right away if you experience any of the following: ? Bleeding from an injury or your nose that does not stop. ? Unusual colored urine (red or dark brown) or unusual  colored stools (red or black). ? Unusual bruising for unknown reasons. ? A serious fall or if you hit your head (even if there is no bleeding).  Some medicines may interact with Eliquis and might increase your risk of bleeding or clotting while on Eliquis. To help avoid this, consult your healthcare provider or pharmacist prior to using any new prescription or non-prescription medications, including herbals, vitamins, non-steroidal anti-inflammatory drugs (NSAIDs) and supplements.  This website has more information on Eliquis (apixaban): http://www.eliquis.com/eliquis/home

## 2020-04-26 NOTE — TOC Progression Note (Signed)
Transition of Care [facility]) - Progression Note    Patient Details  Name: Vernon [name] MRN: [id number] Date of Birth: 1961-05-24  Transition of Care [facility]) CM/SW [place], [place] Phone Number: 04/26/2020, 11:47 AM  Clinical Narrative:     Met with pt.for med assistance and palliative referral consult. Confirmed he has medicaid which would cover eloquis. Pt is okay with outpatient palliative and has no preference. CSW contacted Chrislyn from [employer]. She will meet with pt to discuss potential for outpatient palliative.        Expected Discharge Plan and Services           Expected Discharge Date: 04/26/20                                     Social Determinants of Health (SDOH) Interventions    Readmission Risk Interventions No flowsheet data found.

## 2020-04-26 NOTE — Progress Notes (Signed)
GI Location of Tumor / Histology: Hepatocellular Carcinoma  Vernon [name] presented to his PCP with complaints of right hip pain that radiates down to his right thigh.  Bone Scan 05/02/2020:   MRI T Spine 04/25/2020: Metastatic lesions involving the T12 vertebral body and T9 right posterior elements.  Mild right neuroforaminal stenosis at T9-T10 due to tumor extension.    MRI L Spine 04/25/2020: Large 5.0 x 6.0 cm metastatic lesion involving the right aspect of the L4 vetebral body with paravertebral and epidural tumor extension.  Partially visualized 4.0 x 2.9 cm metastatic lesion in the left sacral ala.  Relatively diffuse enhancement of the other lumbar vertebral bodies without focality, also concerning for metastatic disease.  Severe spinal canal stenosis at L3-L4 and L4-L5 due primarily to degenerative disc disease and prominent posterior epidural fat.  Moderate to severe right neuroforaminal stenosis at L4-L5 due to tumor extension.  CT CAP 04/24/2020: progression of multifocal bilateral hepatocellular carcinoma, osseous metastasis including a dominant right sided L4 lesion, right sided PE without evidence of heart strain, no evidence of metastatic disease in the chest, right portal and hepatic vein occlusion presumably by tumor thrombus.  Biopsies of Liver 09/08/2018   Past/Anticipated interventions by surgeon, if any:   Past/Anticipated interventions by medical oncology, if any:  NP Curcio/Dr. Burr Medico 04/25/2020 -Discussed the patient's prior biopsy and CT scan results.  We discussed that he has hepatocellular carcinoma which has progressed since 2019 and now appears to have bone metastasis. -Await results of the MRI of the thoracic and lumbar spine.  May need to consider palliative radiation to his bone lesions. -Obtain AFP in a.m. -We can consider him for treatment for his [facility] following discharge.  He prefers to follow-up in [place]. -Discussed anticoagulation options with the patient  including Lovenox versus DOAC.  The patient is not interested in giving himself injections and therefore can transition him to Eliquis or Xarelto prior to discharge. -Will need an EGD to evaluate for esophageal varices, but this can likely be done as an outpatient.    Weight changes, if any: No  Bowel/Bladder complaints, if any: No  Nausea / Vomiting, if any: No  Pain issues, if any:  He has some pain in his right leg.   SAFETY ISSUES:  Prior radiation? No  Pacemaker/ICD? No  Possible current pregnancy? n/a  Is the patient on methotrexate? No  Current Complaints/Details: -Hep C

## 2020-04-26 NOTE — Consult Note (Signed)
Palliative Medicine Inpatient Consult Note  Reason for consult:  Goals of Care  HPI:  Per intake H&P --> 59 year old male with past medical history of gout, hepatitis C and hepatocellular carcinoma diagnosed 08/2018 via liver biopsy lost to follow-up who presents to [facility] emergency department after being sent by his primary care provider due to an acute pulmonary embolism found on CT imaging. (+) progressively worsening right hip pain over several months.  Palliative Care was asked to consult in the setting of metastatic hepatocellular carcinoma to discuss goals of care.   Clinical Assessment/Goals of Care: I have reviewed medical records including EPIC notes, labs and imaging, received report from bedside RN, assessed the patient.    I met with Kathlen Mody to further discuss diagnosis prognosis, GOC, EOL wishes, disposition and options.   I introduced Palliative Medicine as specialized medical care for people living with serious illness. It focuses on providing relief from the symptoms and stress of a serious illness. The goal is to improve quality of life for both the patient and the family.  I asked Mahmoud to tell me about himself. He shares that he is from [facility]. 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 Due to the toll this took on his body he ended up transitioning to disability about two and a half years ago. He has never been married. He has been with his significant other (girlfriend), Alvester Chou for the last seventeen years. He has three children all by the same woman, a son and two daughters. He has three grandchildren as well who bring him tremendous joy. He believes in God but does not attend any particular church nor ascribe to any particular denomination.   Denies alcohol use and has a history of smoking cigarettes x18 years but quit in 2019.    Franz states that he lives a very active life. He lives in a home with his girlfriend. He  use to cycle quite often though his right hip pain precluded this as of recently.   I asked Anne to tell me what he understands about his present diagnosis. He states that he had been diagnoses with hepatocellular carcinoma in 2019. His diease has "spread quickly in two years." He understands that he has a lesion in his spine. He said that the radiation oncology team will start radiation on him in the oncoming week. He did not know is any chemotherapy would be offered. Saagar also goes on to tell me that he has a "clot in his right lung." We talked about the relationship between a malignant diagnosis and thrombus formation.  From a symptom perspective, Advit says the most troubling symptom is the pain in his right hip which feels like a constant "tooth ache." He states that the oxycodone he receives is very helpful. We discussed starting him on a long acting iteration with PRN's in between. We also discussed starting some dexamethasone to help with the inflammation from the lesion. He denies recent loss of appetite and weight loss.  Denies fevers and chills.  Denies headaches, dizziness, chest pain, shortness of breath, abdominal pain, nausea, vomiting, constipation, diarrhea  A detailed discussion was had today regarding advanced directives. Markhi has never completed these though he would be interested in doing so while he is here. Yussef states that he would want his daughter, Delana Meyer to make decisions on his behalf if he is ever incapacitated.    Concepts specific to code status, artifical feeding and  hydration, continued IV antibiotics and rehospitalization was had.  The difference between a aggressive medical intervention path  and a palliative comfort care path for this patient at this time was had. Values and goals of care important to patient and family were attempted to be elicited. Trinidad has hopes of seeing his grandchildren grow up and sharing in important life lessons with them. He endorses  wanting to live for them. As of presently he would wish for all offered interventions to be pursued. He is full code / full scope of treatment.  I asked Toshiro is he would be willing to allow a Palliative care provider to follow up with him upon discharge which he was in agreement with.  Discussed the importance of continued conversation with family and their  medical providers regarding overall plan of care and treatment options, ensuring decisions are within the context of the patients values and GOCs.  Decision Maker: Patient can make decisions for himself.   SUMMARY OF RECOMMENDATIONS   Full Code / Full Scope of Treatment  TOC --> OP Palliative Care  Will request my colleague, Wadie Lessen to follow up at the outpatient Palliative clinic at [facility]  Symptom Management as below  Code Status/Advance Care Planning: FULL CODE   Symptom Management:  Metastatic Hepatocellular disease: Acute RLE pain in the setting of L4 Lesion:  - Oncology consulted  - Radiation consulted, plan to start OP Palliative Radiation next week  - Initiation of decadron '2mg'$  PO Q12H  - Initiation of OxyContin '10mg'$  PO Q12H  - Continue oxycodone '5mg'$  PO Q4H PRN  Constipation Ppx:  - Miralax 17g PO QDay  - Senokot-S 2 Tabs PO QHS PRN   Palliative Prophylaxis:   Pain, Constipation  Additional Recommendations (Limitations, Scope, Preferences):  Full scope of treatment   Psycho-social/Spiritual:   Desire for further Chaplaincy support: Yes  Additional Recommendations: Education on outpatient Palliative Care   Prognosis: Unclear, will depend on how patient responds to treatments.  Discharge Planning: Discharge to home with outpatient Palliative Care.   PPS: 80%   This conversation/these recommendations were discussed with patient primary care team, Dr. Reesa Chew  Time In: 0700 Time Out: 0810 Total Time: 76 Greater than 50%  of this time was spent counseling and coordinating care related to the  above assessment and plan.  [place] Team Team Cell Phone: [phone] Please utilize secure chat with additional questions, if there is no response within 30 minutes please call the above phone number  Palliative Medicine Team providers are available by phone from 7am to 7pm daily and can be reached through the team cell phone.  Should this patient require assistance outside of these hours, please call the patient's attending physician.

## 2020-04-26 NOTE — Telephone Encounter (Signed)
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  CMP from 04/26/20 assessed, no relevant lab abnormalities. Monitor BP while on lenvatinib. Prescription dose and frequency assessed.   Current medication list in Epic reviewed, no DDIs with lenvatinib identified.  Prescription has been e-scribed to the [facility] for benefits analysis and approval.  [facility] will continue to follow for insurance authorization, copayment issues, initial counseling and start date.  Darl Pikes, PharmD, BCPS, BCOP, CPP Hematology/Oncology Clinical Pharmacist Practitioner [facility] [phone]  04/26/2020 9:21 AM

## 2020-04-26 NOTE — Discharge Summary (Signed)
Physician Discharge Summary  PHARELL JAKOB [id number] DOB: July 31, 1961 DOA: 04/24/2020  PCP: Mikey College, NP (Inactive)  Admit date: 04/24/2020 Discharge date: 04/26/2020  Admitted From: Home Disposition: Home  Recommendations for Outpatient Follow-up:  1. Follow up with PCP in 1-2 weeks 2. Please obtain BMP/CBC in one week your next doctors visit.  3. Outpatient follow-up with oncology has been arranged 4. Eliquis twice daily ordered.  Advised to avoid NSAIDs 5. Daily Decadron, pain medication, iron supplements and bowel regimen prescribed   Discharge Condition: Stable CODE STATUS: Full code Diet recommendation: Regular  Brief/Interim Summary: 59 year old with history of gout, hep C, [place] diagnosed in October 2019 got lost in follow-up presented to PCP with ongoing generalized fatigue, progressive dyspnea and malaise.  CTA of the chest abdomen pelvis showed progression of his [place] with metastases, right-sided pulmonary embolism.  He was sent to ER for further evaluation, oncology was consulted.  Patient was transitioned to p.o. anticoagulation-Eliquis.  Seen by palliative care service.  It was determined patient will follow-up outpatient with oncology, radiation oncology for further management of his care.   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  Concerns of right portal/hepatic vein occlusion.  Lumbar bony mets.  MRI confirms these thoracic and lumbar bony mets -Transition from heparin drip to oral Eliquis -Appreciate  input from palliative care and oncology.  Plans to follow-up outpatient along with radiation oncology.  Mid to low back pain -Metastatic lesions noted in his thoracic spine, lumbar vertebrae, sacral alla.  There is some tumor extension causing neuroforaminal stenosis. Decadron, pain medication and bowel regimen prescribed  Transaminitis -Secondary to [facility].  Normocytic anemia Iron deficiency anemia -TSH-normal, low iron saturation. -Normal folate -Status post 1 dose of IV iron.  We will discharge him on p.o. iron with bowel regimen.    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    Discharge Exam: Vitals:   04/25/20 2122 04/25/20 2150  BP: (!) 149/82 (!) 138/59  Pulse: 96 97  Resp: 20   Temp: 98.3 F (36.8 C)   SpO2: 98% 94%   Vitals:   04/25/20 0900 04/25/20 1300 04/25/20 2122 04/25/20 2150  BP: (!) 159/95 (!) 150/90 (!) 149/82 (!) 138/59  Pulse: (!) 102 93 96 97  Resp: 19 17 20    Temp:   98.3 F (36.8 C)   TempSrc:   Oral   SpO2: 98% 100% 98% 94%  Weight:      Height:        General: Pt is alert, awake, not in acute distress, pleasant gentleman.  Bilateral temporal wasting. Cardiovascular: RRR, S1/S2 +, no rubs, no gallops Respiratory: CTA bilaterally, no wheezing, no rhonchi  Abdominal: Soft, NT, ND, bowel sounds + Extremities: no edema, no cyanosis  Discharge Instructions   Allergies as of 04/26/2020   No Known Allergies     Medication List    STOP taking these medications   allopurinol 100 MG tablet Commonly known as: ZYLOPRIM    diclofenac 75 MG EC tablet Commonly known as: VOLTAREN   ketorolac 10 MG tablet Commonly known as: TORADOL   meloxicam 7.5 MG tablet Commonly known as: MOBIC     TAKE these medications   apixaban 5 MG Tabs tablet Commonly known as: ELIQUIS Take 2 tablets (10 mg total) by mouth 2 (two) times daily for 7 days, THEN 1 tablet (5 mg total) 2 (two) times daily for 23 days. Start taking on: Apr 26, 2020   cyclobenzaprine 10 MG tablet Commonly known as: FLEXERIL Take 1 tablet (10 mg total) by mouth at bedtime.   dexamethasone 2 MG tablet Commonly known as: DECADRON Take 1 tablet (2 mg total) by mouth every 12 (twelve) hours.   ferrous sulfate 325 (65 FE) MG tablet Take 1 tablet (325 mg total) by mouth 2 (two) times daily with a meal.   Lenvatinib 12 mg daily dose 3 x 4 MG capsule Commonly known as: LENVIMA Take 12 mg by mouth daily.   oxyCODONE 5 MG immediate release tablet Commonly known as: Oxy IR/ROXICODONE Take 1 tablet (5 mg total) by mouth every 4 (four) hours as needed for moderate pain.   oxyCODONE 10 mg 12 hr tablet Commonly known as: OXYCONTIN Take 1 tablet (10 mg total) by mouth every 12 (twelve) hours.   polyethylene glycol 17 g packet Commonly known as: MIRALAX / GLYCOLAX Take 17 g by mouth daily as needed for mild constipation.   senna-docusate 8.6-50 MG tablet Commonly known as: Senokot-S Take 2 tablets by mouth at bedtime as needed for mild constipation or moderate constipation.      Follow-up Information    Mikey College, NP. Schedule an appointment as soon as possible for a visit in 1 week(s).   Specialty: Nurse Practitioner Contact information: [address] [place] [address] [phone]        Truitt Merle, MD. Schedule an appointment as soon as possible for a visit in 4 day(s).   Specialties: Hematology, Oncology Contact information: [place] [address] [phone]          No Known  Allergies  You were cared for by a hospitalist during your hospital stay. If you have any questions about your discharge medications or the care you received while you were in the hospital after you are discharged, you can call the unit and asked to speak with the hospitalist on call if the hospitalist that took care of you is not available. Once you are discharged, your primary care physician will handle any further medical issues. Please note that no refills for any discharge medications will be authorized once you are discharged, as it is imperative that you return to your primary care physician (or establish a relationship with a primary care physician if you do not have one) for your aftercare needs so that they can reassess your need for medications and monitor your lab values.   Procedures/Studies: CT CHEST W CONTRAST  Result Date: 04/24/2020 CLINICAL DATA:  Hepatocellular carcinoma, restaging. EXAM: CT CHEST WITH CONTRAST CT ABDOMEN WITH AND WITHOUT CONTRAST TECHNIQUE: Multidetector CT imaging of the chest was performed during intravenous contrast administration. Multidetector CT imaging of the abdomen  was performed following the standard protocol before and during bolus administration of intravenous contrast. CONTRAST:  111mL ISOVUE-300 IOPAMIDOL (ISOVUE-300) INJECTION 61% COMPARISON:  PET of 08/11/2018. Abdominal MRI of 07/15/2018. No prior chest CT. FINDINGS: CT CHEST FINDINGS Cardiovascular: Aortic atherosclerosis. Tortuous thoracic aorta. Normal heart size, without pericardial effusion. Lad coronary artery calcification. Right lower lobe lobar and segmental nearly occlusive pulmonary emboli, including on 91/15 and 99/17. No evidence of right heart strain. Mediastinum/Nodes: No mediastinal or hilar adenopathy. Lungs/Pleura: No pleural fluid. Moderate centrilobular and paraseptal emphysema. No pulmonary metastasis. Musculoskeletal: Degenerative changes of the left acromioclavicular joint are  advanced. Lytic lesion within the left side of the T12 vertebral body measures 2.3 cm on 151/15 and 103/19. CT ABDOMEN FINDINGS Hepatobiliary: Mild cirrhosis. Multifocal hepatocellular carcinoma. Dominant mass or masses throughout the majority of the right lobe. Example at 10.2 x 14.6 cm on 47/10. At the site of a 5.0 x 4.3 cm lesion on 07/15/2018 (when remeasured). Left-sided lesions including a segment 2 hypervascular mass at 4.5 x 3.3 cm, new on 55/10. Normal gallbladder, without biliary ductal dilatation. Pancreas: Calcifications within the pancreatic head may relate to chronic calcific pancreatitis. No duct dilatation or acute inflammation. Spleen: Normal in size, without focal abnormality. Adrenals/Urinary Tract: Normal adrenal glands. Normal kidneys, without hydronephrosis. Stomach/Bowel: Normal stomach, without wall thickening. Normal colon, appendix, and terminal ileum. Normal small bowel. Vascular/Lymphatic: Advanced aortic and branch vessel atherosclerosis. 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 Right hepatic vein thrombus including on 143/15. Prominent porta hepatis nodes are likely reactive in the setting of cirrhosis, grossly similar to on the prior MRI. Other: No ascites.  No evidence of omental or peritoneal disease. Musculoskeletal: Right-sided L4 osseous and soft tissue mass including at 5.5 x 5.0 cm on 217/15. L 4-5 grade 1 anterolisthesis. IMPRESSION: 1. Significant progression of multifocal bilateral hepatocellular carcinoma. 2. Osseous metastasis, including a dominant right-sided L4 lesion. Consider further evaluation with lumbar and possibly thoracic spine MRI, given the size of the dominant right-sided L4 lesion. 3. Right-sided pulmonary embolism, without evidence of right heart strain. 4. No evidence of metastatic disease within the chest. 5. 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 6. Aortic atherosclerosis (ICD10-I70.0), coronary artery  atherosclerosis and emphysema (ICD10-J43.9). These results will be called to the ordering clinician or representative by the Radiologist Assistant, and communication documented in the PACS or [employer]. Electronically Signed   By: Abigail Miyamoto M.D.   On: 04/24/2020 12:32   MR THORACIC SPINE W WO CONTRAST  Result Date: 04/25/2020 CLINICAL DATA:  New thoracolumbar spine lesions on recent restaging CT scan for hepatocellular carcinoma. EXAM: MRI THORACIC AND LUMBAR SPINE WITHOUT AND WITH CONTRAST TECHNIQUE: Multiplanar and multiecho pulse sequences of the thoracic and lumbar spine were obtained without and with intravenous contrast. CONTRAST:  31mL GADAVIST GADOBUTROL 1 MMOL/ML IV SOLN COMPARISON:  CT chest and abdomen from yesterday. FINDINGS: MRI THORACIC SPINE FINDINGS Alignment:  Physiologic. Vertebrae: 2.3 cm enhancing lesion in the T12 vertebral body. Additional 2.3 cm enhancing lesion in the right T9 pedicle and posterior elements. No fracture or evidence of discitis. Cord:  Normal signal and morphology.  No intradural enhancement. Paraspinal and other soft tissues: Negative. Disc levels: No significant disc bulge or herniation. No spinal canal stenosis. Mild right neuroforaminal stenosis at T9-T10 due to tumor. Mild left neuroforaminal stenosis at T10-T11 due to facet arthropathy. MRI LUMBAR SPINE FINDINGS Segmentation:  Standard. Alignment: Unchanged 9 mm anterolisthesis at L4-L5. Unchanged trace retrolisthesis at  L5-S1. Vertebrae: Large metastatic lesion involving the right aspect of the L4 vertebral body extending into the paravertebral soft tissues, measuring 5.0 x 6.0 cm. Relatively diffuse enhancement of the other lumbar vertebral bodies. Partially visualized 4.0 x 2.9 cm lesion in the left sacral ala. Mild pathologic fracture involving the right aspect of the L4 vertebral body. No evidence of discitis. Schmorl's node along the L1 inferior endplate. Conus medullaris: Extends to the L1 level and  appears normal. No intradural enhancement. Paraspinal and other soft tissues: Partially visualized large mass within the right hepatic lobe. Disc levels: T12-L1:  Negative. L1-L2:  No significant disc bulge or herniation.  No stenosis. L2-L3:  Mild disc bulging.  No stenosis. L3-L4: Moderate disc bulging. Mild bilateral facet arthropathy. Prominent posterior epidural fat. Right-sided epidural tumor extension. Severe spinal canal stenosis. Mild bilateral neuroforaminal stenosis. L4-L5: Disc uncovering and moderate disc bulging. Severe bilateral facet arthropathy with bilateral posterior paraspinous synovial cysts. Prominent posterior epidural fat. Severe spinal canal stenosis. Tumor extension into the right neural foramen. Moderate to severe right and mild left neuroforaminal stenosis. L5-S1: Moderate circumferential disc osteophyte complex. Mild bilateral facet arthropathy. Mild bilateral neuroforaminal stenosis. No spinal canal stenosis. IMPRESSION: Thoracic spine: 1. Metastatic lesions involving the T12 vertebral body and T9 right posterior elements. 2. Mild right neuroforaminal stenosis at T9-T10 due to tumor extension. Lumbar spine: 1. Large 5.0 x 6.0 cm metastatic lesion involving the right aspect of the L4 vertebral body with paravertebral and epidural tumor extension. 2. Partially visualized 4.0 x 2.9 cm metastatic lesion in the left sacral ala. 3. Relatively diffuse enhancement of the other lumbar vertebral bodies without focality, also concerning for metastatic disease. 4. Severe spinal canal stenosis at L3-L4 and L4-L5 due primarily to degenerative disc disease and prominent posterior epidural fat. 5. Moderate to severe right neuroforaminal stenosis at L4-L5 due to tumor extension. Electronically Signed   By: Titus Dubin M.D.   On: 04/25/2020 15:07   MR Lumbar Spine W Wo Contrast  Result Date: 04/25/2020 CLINICAL DATA:  New thoracolumbar spine lesions on recent restaging CT scan for hepatocellular  carcinoma. EXAM: MRI THORACIC AND LUMBAR SPINE WITHOUT AND WITH CONTRAST TECHNIQUE: Multiplanar and multiecho pulse sequences of the thoracic and lumbar spine were obtained without and with intravenous contrast. CONTRAST:  53mL GADAVIST GADOBUTROL 1 MMOL/ML IV SOLN COMPARISON:  CT chest and abdomen from yesterday. FINDINGS: MRI THORACIC SPINE FINDINGS Alignment:  Physiologic. Vertebrae: 2.3 cm enhancing lesion in the T12 vertebral body. Additional 2.3 cm enhancing lesion in the right T9 pedicle and posterior elements. No fracture or evidence of discitis. Cord:  Normal signal and morphology.  No intradural enhancement. Paraspinal and other soft tissues: Negative. Disc levels: No significant disc bulge or herniation. No spinal canal stenosis. Mild right neuroforaminal stenosis at T9-T10 due to tumor. Mild left neuroforaminal stenosis at T10-T11 due to facet arthropathy. MRI LUMBAR SPINE FINDINGS Segmentation:  Standard. Alignment: Unchanged 9 mm anterolisthesis at L4-L5. Unchanged trace retrolisthesis at L5-S1. Vertebrae: Large metastatic lesion involving the right aspect of the L4 vertebral body extending into the paravertebral soft tissues, measuring 5.0 x 6.0 cm. Relatively diffuse enhancement of the other lumbar vertebral bodies. Partially visualized 4.0 x 2.9 cm lesion in the left sacral ala. Mild pathologic fracture involving the right aspect of the L4 vertebral body. No evidence of discitis. Schmorl's node along the L1 inferior endplate. Conus medullaris: Extends to the L1 level and appears normal. No intradural enhancement. Paraspinal and other soft tissues: Partially visualized large  mass within the right hepatic lobe. Disc levels: T12-L1:  Negative. L1-L2:  No significant disc bulge or herniation.  No stenosis. L2-L3:  Mild disc bulging.  No stenosis. L3-L4: Moderate disc bulging. Mild bilateral facet arthropathy. Prominent posterior epidural fat. Right-sided epidural tumor extension. Severe spinal canal  stenosis. Mild bilateral neuroforaminal stenosis. L4-L5: Disc uncovering and moderate disc bulging. Severe bilateral facet arthropathy with bilateral posterior paraspinous synovial cysts. Prominent posterior epidural fat. Severe spinal canal stenosis. Tumor extension into the right neural foramen. Moderate to severe right and mild left neuroforaminal stenosis. L5-S1: Moderate circumferential disc osteophyte complex. Mild bilateral facet arthropathy. Mild bilateral neuroforaminal stenosis. No spinal canal stenosis. IMPRESSION: Thoracic spine: 1. Metastatic lesions involving the T12 vertebral body and T9 right posterior elements. 2. Mild right neuroforaminal stenosis at T9-T10 due to tumor extension. Lumbar spine: 1. Large 5.0 x 6.0 cm metastatic lesion involving the right aspect of the L4 vertebral body with paravertebral and epidural tumor extension. 2. Partially visualized 4.0 x 2.9 cm metastatic lesion in the left sacral ala. 3. Relatively diffuse enhancement of the other lumbar vertebral bodies without focality, also concerning for metastatic disease. 4. Severe spinal canal stenosis at L3-L4 and L4-L5 due primarily to degenerative disc disease and prominent posterior epidural fat. 5. Moderate to severe right neuroforaminal stenosis at L4-L5 due to tumor extension. Electronically Signed   By: Titus Dubin M.D.   On: 04/25/2020 15:07   CT ABDOMEN W WO CONTRAST  Result Date: 04/24/2020 CLINICAL DATA:  Hepatocellular carcinoma, restaging. EXAM: CT CHEST WITH CONTRAST CT ABDOMEN WITH AND WITHOUT CONTRAST TECHNIQUE: Multidetector CT imaging of the chest was performed during intravenous contrast administration. Multidetector CT imaging of the abdomen was performed following the standard protocol before and during bolus administration of intravenous contrast. CONTRAST:  152mL ISOVUE-300 IOPAMIDOL (ISOVUE-300) INJECTION 61% COMPARISON:  PET of 08/11/2018. Abdominal MRI of 07/15/2018. No prior chest CT. FINDINGS: CT  CHEST FINDINGS Cardiovascular: Aortic atherosclerosis. Tortuous thoracic aorta. Normal heart size, without pericardial effusion. Lad coronary artery calcification. Right lower lobe lobar and segmental nearly occlusive pulmonary emboli, including on 91/15 and 99/17. No evidence of right heart strain. Mediastinum/Nodes: No mediastinal or hilar adenopathy. Lungs/Pleura: No pleural fluid. Moderate centrilobular and paraseptal emphysema. No pulmonary metastasis. Musculoskeletal: Degenerative changes of the left acromioclavicular joint are advanced. Lytic lesion within the left side of the T12 vertebral body measures 2.3 cm on 151/15 and 103/19. CT ABDOMEN FINDINGS Hepatobiliary: Mild cirrhosis. Multifocal hepatocellular carcinoma. Dominant mass or masses throughout the majority of the right lobe. Example at 10.2 x 14.6 cm on 47/10. At the site of a 5.0 x 4.3 cm lesion on 07/15/2018 (when remeasured). Left-sided lesions including a segment 2 hypervascular mass at 4.5 x 3.3 cm, new on 55/10. Normal gallbladder, without biliary ductal dilatation. Pancreas: Calcifications within the pancreatic head may relate to chronic calcific pancreatitis. No duct dilatation or acute inflammation. Spleen: Normal in size, without focal abnormality. Adrenals/Urinary Tract: Normal adrenal glands. Normal kidneys, without hydronephrosis. Stomach/Bowel: Normal stomach, without wall thickening. Normal colon, appendix, and terminal ileum. Normal small bowel. Vascular/Lymphatic: Advanced aortic and branch vessel atherosclerosis. 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 Right hepatic vein thrombus including on 143/15. Prominent porta hepatis nodes are likely reactive in the setting of cirrhosis, grossly similar to on the prior MRI. Other: No ascites.  No evidence of omental or peritoneal disease. Musculoskeletal: Right-sided L4 osseous and soft tissue mass including at 5.5 x 5.0 cm on 217/15. L 4-5 grade 1 anterolisthesis.  IMPRESSION: 1. Significant progression of multifocal bilateral hepatocellular carcinoma. 2. Osseous metastasis, including a dominant right-sided L4 lesion. Consider further evaluation with lumbar and possibly thoracic spine MRI, given the size of the dominant right-sided L4 lesion. 3. Right-sided pulmonary embolism, without evidence of right heart strain. 4. No evidence of metastatic disease within the chest. 5. 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 6. Aortic atherosclerosis (ICD10-I70.0), coronary artery atherosclerosis and emphysema (ICD10-J43.9). These results will be called to the ordering clinician or representative by the Radiologist Assistant, and communication documented in the PACS or [employer]. Electronically Signed   By: Abigail Miyamoto M.D.   On: 04/24/2020 12:32      The results of significant diagnostics from this hospitalization (including imaging, microbiology, ancillary and laboratory) are listed below for reference.     Microbiology: Recent Results (from the past 240 hour(s))  SARS Coronavirus 2 by RT PCR (hospital order, performed in [facility] hospital lab) Nasopharyngeal Nasopharyngeal Swab     Status: None   Collection Time: 04/25/20 12:58 AM   Specimen: Nasopharyngeal Swab  Result Value Ref Range Status   SARS Coronavirus 2 NEGATIVE NEGATIVE Final    Comment: (NOTE) SARS-CoV-2 target nucleic acids are NOT DETECTED. The SARS-CoV-2 RNA is generally detectable in upper and lower respiratory specimens during the acute phase of infection. The lowest concentration of SARS-CoV-2 viral copies this assay can detect is 250 copies / mL. A negative result does not preclude SARS-CoV-2 infection and should not be used as the sole basis for treatment or other patient management decisions.  A negative result may occur with improper specimen collection / handling, submission of specimen other than nasopharyngeal swab, presence of viral mutation(s)  within the areas targeted by this assay, and inadequate number of viral copies (<250 copies / mL). A negative result must be combined with clinical observations, patient history, and epidemiological information. Fact Sheet for Patients:   StrictlyIdeas.no Fact Sheet for Healthcare Providers: BankingDealers.co.za This test is not yet approved or cleared  by the [place] FDA and has been authorized for detection and/or diagnosis of SARS-CoV-2 by FDA under an Emergency Use Authorization (EUA).  This EUA will remain in effect (meaning this test can be used) for the duration of the COVID-19 declaration under Section 564(b)(1) of the Act, 21 U.S.C. section 360bbb-3(b)(1), unless the authorization is terminated or revoked sooner. Performed at [facility], [place] [address], [place], [place] [address]      Labs: BNP (last 3 results) No results for input(s): BNP in the last 8760 hours. Basic Metabolic Panel: Recent Labs  Lab 04/24/20 1945 04/25/20 0604 04/26/20 0216  NA 134*  --  137  K 4.3  --  4.6  CL 103  --  106  CO2 22  --  23  GLUCOSE 85  --  98  BUN 11  --  9  CREATININE 1.24  --  1.19  CALCIUM 9.1  --  9.1  MG  --  1.8 1.8   Liver Function Tests: Recent Labs  Lab 04/24/20 2216 04/26/20 0216  AST 103* 106*  ALT 81* 76*  ALKPHOS 205* 200*  BILITOT 1.3* 0.9  PROT 7.2 7.0  ALBUMIN 3.2* 2.9*   No results for input(s): LIPASE, AMYLASE in the last 168 hours. No results for input(s): AMMONIA in the last 168 hours. CBC: Recent Labs  Lab 04/24/20 1945 04/25/20 0604 04/26/20 0216  WBC 6.5 5.8 6.3  HGB 8.7* 8.5* 8.4*  HCT 28.7* 28.0* 28.3*  MCV 89.4 90.0 90.7  PLT 293 271 260   Cardiac Enzymes: No results for input(s): CKTOTAL, CKMB, CKMBINDEX, TROPONINI in the last 168 hours. BNP: Invalid input(s): POCBNP CBG: No results for input(s): GLUCAP in the last 168 hours. D-Dimer No results for input(s): DDIMER  in the last 72 hours. Hgb A1c No results for input(s): HGBA1C in the last 72 hours. Lipid Profile No results for input(s): CHOL, HDL, LDLCALC, TRIG, CHOLHDL, LDLDIRECT in the last 72 hours. Thyroid function studies Recent Labs    04/25/20 0942  TSH 1.000   Anemia work up Recent Labs    04/25/20 0604  VITAMINB12 260  FOLATE 10.3  TIBC 589*  IRON 31*   Urinalysis    Component Value Date/Time   COLORURINE YELLOW (A) 05/25/2018 1119   APPEARANCEUR CLEAR (A) 05/25/2018 1119   LABSPEC 1.016 05/25/2018 1119   PHURINE 5.0 05/25/2018 1119   GLUCOSEU NEGATIVE 05/25/2018 1119   HGBUR NEGATIVE 05/25/2018 1119   BILIRUBINUR NEGATIVE 05/25/2018 1119   KETONESUR NEGATIVE 05/25/2018 1119   PROTEINUR NEGATIVE 05/25/2018 1119   NITRITE NEGATIVE 05/25/2018 1119   LEUKOCYTESUR NEGATIVE 05/25/2018 1119   Sepsis Labs Invalid input(s): PROCALCITONIN,  WBC,  LACTICIDVEN Microbiology Recent Results (from the past 240 hour(s))  SARS Coronavirus 2 by RT PCR (hospital order, performed in [facility] [place] hospital lab) Nasopharyngeal Nasopharyngeal Swab     Status: None   Collection Time: 04/25/20 12:58 AM   Specimen: Nasopharyngeal Swab  Result Value Ref Range Status   SARS Coronavirus 2 NEGATIVE NEGATIVE Final    Comment: (NOTE) SARS-CoV-2 target nucleic acids are NOT DETECTED. The SARS-CoV-2 RNA is generally detectable in upper and lower respiratory specimens during the acute phase of infection. The lowest concentration of SARS-CoV-2 viral copies this assay can detect is 250 copies / mL. A negative result does not preclude SARS-CoV-2 infection and should not be used as the sole basis for treatment or other patient management decisions.  A negative result may occur with improper specimen collection / handling, submission of specimen other than nasopharyngeal swab, presence of viral mutation(s) within the areas targeted by this assay, and inadequate number of viral copies (<250 copies / mL). A  negative result must be combined with clinical observations, patient history, and epidemiological information. Fact Sheet for Patients:   StrictlyIdeas.no Fact Sheet for Healthcare Providers: BankingDealers.co.za This test is not yet approved or cleared  by the [place] FDA and has been authorized for detection and/or diagnosis of SARS-CoV-2 by FDA under an Emergency Use Authorization (EUA).  This EUA will remain in effect (meaning this test can be used) for the duration of the COVID-19 declaration under Section 564(b)(1) of the Act, 21 U.S.C. section 360bbb-3(b)(1), unless the authorization is terminated or revoked sooner. Performed at [facility], [place] [address], [place], [place] [address]      Time coordinating discharge:  I have spent 35 minutes face to face with the patient and on the ward discussing the patients care, assessment, plan and disposition with other care givers. >50% of the time was devoted counseling the patient about the risks and benefits of treatment/Discharge disposition and coordinating care.   SIGNED:   Damita Lack, MD  [facility] 04/26/2020, 11:47 AM   If 7PM-7AM, please contact night-coverage

## 2020-04-26 NOTE — Telephone Encounter (Signed)
Contacted patient to verify telephone visit for pre reg °

## 2020-04-26 NOTE — Progress Notes (Signed)
This chaplain responded to PMT consult for Pt. ACP. The chaplain spoke to the Pt. RN-Katie S.  The chaplain understands the Pt. is getting ready to discharge.  The chaplain gave the Pt. ACP brochure/education on the way off the floor.

## 2020-04-26 NOTE — Evaluation (Signed)
Physical Therapy Evaluation - One-time Patient Details Name: Vernon [name] MRN: [id number] DOB: 1961/07/29 Today's Date: 04/26/2020   History of Present Illness  59 yo male admitted to ED on 5/26 for abnormal CT, R hip pain. 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 Pt diagnosed with hepatocellular carcinoma 08/2018 and was "lost to follow up".  Clinical Impression   Pt presents with [facility] mobility, strength, gait, and balance upon PT assessment. Pt with good tolerance for activity, proficiently navigating hallways and stairs without difficulty. No shortness of breath or fatigue noted with activity, pt's significant other in room and very supportive. Pt with no further acute PT needs, and no follow up needs at this time.      Follow Up Recommendations No PT follow up    Equipment Recommendations  None recommended by PT    Recommendations for Other Services       Precautions / Restrictions Precautions Precautions: Fall Restrictions Weight Bearing Restrictions: No      Mobility  Bed Mobility Overal bed mobility: Independent                Transfers Overall transfer level: Independent                  Ambulation/Gait Ambulation/Gait assistance: Supervision Gait Distance (Feet): 500 Feet Assistive device: None Gait Pattern/deviations: Step-through pattern;WFL(Within Functional Limits) Gait velocity: WFL, when prompted to assume normal gait speed   General Gait Details: supervision for safety, mild gait deviations with higher level challenge (see balance section)  Stairs Stairs: Yes Stairs assistance: Supervision Stair Management: One rail Right;Alternating pattern;Forwards Number of Stairs:  10 General stair comments: supeervision for safety only, no physical assist required.  Wheelchair Mobility    Modified Rankin (Stroke Patients Only)       Balance Overall balance assessment: Modified Independent   Sitting balance-Leahy Scale: Normal       Standing balance-Leahy Scale: Good Standing balance comment: moderate challenge accepted             High level balance activites: Turns;Sudden stops;Head turns;Direction changes;Other (comment) High Level Balance Comments: mild gait weaving with L/R head turns, other challenges to gait handled well without evidence of unsteadiness             Pertinent Vitals/Pain Pain Assessment: Faces Faces Pain Scale: Hurts a little bit Pain Location: R hip Pain Descriptors / Indicators: Aching;Dull Pain Intervention(s): Limited activity within patient's tolerance;Repositioned;Monitored during session    Home Living Family/patient expects to be discharged to:: Private residence Living Arrangements: Spouse/significant other;Children Available Help at Discharge: Family;Available 24 hours/day Type of Home: House Home Access: Stairs to enter   [employer] of Steps: a few Home Layout: One level Home Equipment: None      Prior Function Level of Independence: Independent         Comments: able to drive, independent in mobility and ADLs     Hand Dominance   Dominant Hand: Right    Extremity/Trunk Assessment   Upper Extremity Assessment Upper Extremity Assessment: Overall WFL for tasks assessed    Lower Extremity Assessment Lower Extremity Assessment: Overall WFL for tasks assessed    Cervical / Trunk Assessment Cervical / Trunk Assessment: Normal  Communication   Communication: No difficulties  Cognition Arousal/Alertness: Awake/alert Behavior During Therapy:  WFL for tasks assessed/performed Overall Cognitive Status: Within Functional Limits for tasks assessed                                         General Comments      Exercises     Assessment/Plan    PT Assessment Patent does not need any further PT services  PT Problem List         PT Treatment Interventions      PT Goals (Current goals can be found in the Care Plan section)  Acute Rehab PT Goals PT Goal Formulation: With patient Time For Goal Achievement: 04/26/20 Potential to Achieve Goals: Good    Frequency     Barriers to discharge        Co-evaluation               AM-PAC PT "6 Clicks" Mobility  Outcome Measure Help needed turning from your back to your side while in a flat bed without using bedrails?: None Help needed moving from lying on your back to sitting on the side of a flat bed without using bedrails?: None Help needed moving to and from a bed to a chair (including a wheelchair)?: None Help needed standing up from a chair using your arms (e.g., wheelchair or bedside chair)?: None Help needed to walk in hospital room?: None Help needed climbing 3-5 steps with a railing? : None 6 Click Score: 24    End of Session   Activity Tolerance: Patient tolerated treatment well Patient left: Other (comment);with family/visitor present;with call bell/phone within reach(up at bathroom) Nurse Communication: Mobility status PT Visit Diagnosis: Other abnormalities of gait and mobility (R26.89)    Time: 1142-1150 PT Time Calculation (min) (ACUTE ONLY): 8 min   Charges:   PT Evaluation $PT Eval Low Complexity: 1 Low         Zadie Deemer E, PT Acute Rehabilitation Services Pager [phone]  Office [phone]  Cono Gebhard D Elonda Husky 04/26/2020, 12:15 PM

## 2020-04-26 NOTE — Plan of Care (Signed)

## 2020-04-27 LAB — AFP TUMOR MARKER: AFP, Serum, Tumor Marker: 313 ng/mL — ABNORMAL HIGH (ref 0.0–8.3)

## 2020-04-30 ENCOUNTER — Ambulatory Visit
Admission: RE | Admit: 2020-04-30 | Discharge: 2020-04-30 | Disposition: A | Discharge status: Home / Self Care | Payer: Disability Insurance | Source: Ambulatory Visit | Attending: Radiation Oncology | Admitting: Radiation Oncology

## 2020-04-30 ENCOUNTER — Ambulatory Visit
Admit: 2020-04-30 | Discharge: 2020-04-30 | Disposition: A | Discharge status: Home / Self Care | Payer: Disability Insurance | Attending: Radiation Oncology | Admitting: Radiation Oncology

## 2020-04-30 ENCOUNTER — Encounter: Payer: Self-pay | Admitting: Radiation Oncology

## 2020-04-30 ENCOUNTER — Other Ambulatory Visit: Payer: Self-pay

## 2020-04-30 VITALS — Ht 66.0 in | Wt 165.0 lb

## 2020-04-30 DIAGNOSIS — C7951 Secondary malignant neoplasm of bone: Secondary | ICD-10-CM | POA: Insufficient documentation

## 2020-04-30 DIAGNOSIS — C220 Liver cell carcinoma: Secondary | ICD-10-CM

## 2020-04-30 DIAGNOSIS — M25551 Pain in right hip: Secondary | ICD-10-CM

## 2020-04-30 DIAGNOSIS — G893 Neoplasm related pain (acute) (chronic): Secondary | ICD-10-CM

## 2020-04-30 NOTE — Progress Notes (Signed)
Radiation Oncology         [phone] ________________________________  Initial Outpatient Consultation - Conducted via telephone due to current COVID-19 concerns for limiting patient exposure  I spoke with the patient to conduct this consult visit via telephone to spare the patient unnecessary potential exposure in the healthcare setting during the current COVID-19 pandemic. The patient was notified in advance and was offered a [place] meeting to allow for face to face communication but unfortunately reported that they did not have the appropriate resources/technology to support such a visit and instead preferred to proceed with a telephone consult.   Name: Vernon [name]        MRN: [id number]  Date of Service: 04/30/2020 DOB: [birth year]  [id number], Jerrel Ivory, NP (Inactive)  Truitt Merle, MD     REFERRING PHYSICIAN: Truitt Merle, MD   DIAGNOSIS: The primary encounter diagnosis was Right hip pain. Diagnoses of Pain of metastatic malignancy, Hepatocellular carcinoma ([place]), and Bone metastasis ([place]) were also pertinent to this visit.   HISTORY OF PRESENT ILLNESS: Vernon [name] is a 59 y.o. male seen at the request of Dr. Burr Medico with a recently noted metastatic lesion in the bone.  The patient has a history of hepatocellular carcinoma which was originally diagnosed in approximately 2019.  He was lost to follow-up and did not receive treatment for this, he recently presented with several months of hip pain in the right side.  He was due for restaging imaging and was seen in the emergency room on 04/24/2020.  He had prior orders for his staging scans so these were performed while he was in the hospital on 04/24/2020 revealed significant progression of multifocal bilateral hepatocellular carcinoma as an example the area in the majority of the right lobe measures 10.2 x 14.6 cm and had previously been 5 x 4.3 cm in 2019.  There was also a left segment 2 hypervascular mass measuring 4.5 x 3.3 cm that was new.   Additionally the scan identified a 5.5 x 5 cm mass on the right side of the L4 vertebral body further MRI on 04/25/2020 revealed a large metastatic lesion involving the right aspect of the L4 vertebral body extending into the Para[place]ard vertebral soft tissues measuring 5 x 6 cm with relatively diffuse enhancement in the upper lumbar vertebrae, partially visualized 4 x 2.9 cm area in the left sacral alla, and mild pathologic fracture involving the right aspect of the L4 vertebral body with epidural extension.  He also had metastatic lesions at T9, and T12 and mild neuroforaminal stenosis at T9-T10 due to tumor extension.  He is contacted today to discuss options of palliative radiotherapy.  He saw Dr. Burr Medico on 04/25/2020 and she plans targeted versus immunotherapy options following the completion of radiotherapy.    PREVIOUS RADIATION THERAPY: No   PAST MEDICAL HISTORY:  Past Medical History:  Diagnosis Date  . Gout   . Hepatitis C   . Hepatocellular carcinoma ([place])        PAST SURGICAL HISTORY:No past surgical history on file.   FAMILY HISTORY:  Family History  Problem Relation Age of Onset  . Cancer Mother   . Cancer Father   . Hypertension Maternal Uncle   . Healthy Daughter   . Healthy Son      SOCIAL HISTORY:  reports that he quit smoking about 20 months ago. His smoking use included cigarettes. He quit after 18.00 years of use. He has never used smokeless tobacco. He reports current alcohol use.  He reports that he does not use drugs. The patient is single. 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   ALLERGIES: Patient has no known allergies.   MEDICATIONS:  Current Outpatient Medications  Medication Sig Dispense Refill  . apixaban (ELIQUIS) 5 MG TABS tablet Take 2 tablets (10 mg total) by mouth 2 (two) times daily for 7 days, THEN 1 tablet (5 mg total) 2 (two) times daily for 23 days. 74 tablet 0  .  cyclobenzaprine (FLEXERIL) 10 MG tablet Take 1 tablet (10 mg total) by mouth at bedtime. (Patient not taking: Reported on 04/24/2020) 20 tablet 1  . dexamethasone (DECADRON) 2 MG tablet Take 1 tablet (2 mg total) by mouth every 12 (twelve) hours. 30 tablet 0  . ferrous sulfate 325 (65 FE) MG tablet Take 1 tablet (325 mg total) by mouth 2 (two) times daily with a meal. 60 tablet 0  . Lenvatinib 12 mg daily dose (LENVIMA) 3 x 4 MG capsule Take 12 mg by mouth daily. (Patient not taking: Reported on 04/24/2020) 90 capsule 1  . oxyCODONE (OXY IR/ROXICODONE) 5 MG immediate release tablet Take 1 tablet (5 mg total) by mouth every 4 (four) hours as needed for moderate pain. 30 tablet 0  . oxyCODONE (OXYCONTIN) 10 mg 12 hr tablet Take 1 tablet (10 mg total) by mouth every 12 (twelve) hours. 14 tablet 0  . polyethylene glycol (MIRALAX / GLYCOLAX) 17 g packet Take 17 g by mouth daily as needed for mild constipation. 14 each 0  . senna-docusate (SENOKOT-S) 8.6-50 MG tablet Take 2 tablets by mouth at bedtime as needed for mild constipation or moderate constipation. 30 tablet 0   No current facility-administered medications for this encounter.     REVIEW OF SYSTEMS: On review of systems, the patient reports that he is doing well overall. He reports his pain is managed currently. He is taking his steroids twice a day and denies any loss of sensory or motor function of his extremities. He does have pain in his right hip and into the groin, as well as pain in his shoulder blade area on the right. No other complaints are verbalized.     PHYSICAL EXAM:  Unable to assess due to encounter type.   ECOG = 1  0 - Asymptomatic (Fully active, able to carry on all predisease activities without restriction)  1 - Symptomatic but completely ambulatory (Restricted in physically strenuous activity but ambulatory and able to carry out work of a light or sedentary nature. For example, light housework, office work)  2 -  Symptomatic, <50% in bed during the day (Ambulatory and capable of all self care but unable to carry out any work activities. Up and about more than 50% of waking hours)  3 - Symptomatic, >50% in bed, but not bedbound (Capable of only limited self-care, confined to bed or chair 50% or more of waking hours)  4 - Bedbound (Completely disabled. Cannot carry on any self-care. Totally confined to bed or chair)  5 - Death   Eustace Pen MM, Creech RH, Tormey DC, et al. [phone]). "Toxicity and response criteria of the [facility]". [place] Oncol. 5 (6): 649-55    LABORATORY DATA:  Lab Results  Component Value Date   WBC 6.3 04/26/2020   HGB 8.4 (L) 04/26/2020   HCT 28.3 (L) 04/26/2020   MCV 90.7 04/26/2020   PLT 260 04/26/2020  Lab Results  Component Value Date   NA 137 04/26/2020   K 4.6 04/26/2020   CL 106 04/26/2020   CO2 23 04/26/2020   Lab Results  Component Value Date   ALT 76 (H) 04/26/2020   AST 106 (H) 04/26/2020   ALKPHOS 200 (H) 04/26/2020   BILITOT 0.9 04/26/2020      RADIOGRAPHY: CT CHEST W CONTRAST  Result Date: 04/24/2020 CLINICAL DATA:  Hepatocellular carcinoma, restaging. EXAM: CT CHEST WITH CONTRAST CT ABDOMEN WITH AND WITHOUT CONTRAST TECHNIQUE: Multidetector CT imaging of the chest was performed during intravenous contrast administration. Multidetector CT imaging of the abdomen was performed following the standard protocol before and during bolus administration of intravenous contrast. CONTRAST:  122mL ISOVUE-300 IOPAMIDOL (ISOVUE-300) INJECTION 61% COMPARISON:  PET of 08/11/2018. Abdominal MRI of 07/15/2018. No prior chest CT. FINDINGS: CT CHEST FINDINGS Cardiovascular: Aortic atherosclerosis. Tortuous thoracic aorta. Normal heart size, without pericardial effusion. Lad coronary artery calcification. Right lower lobe lobar and segmental nearly occlusive pulmonary emboli, including on 91/15 and 99/17. No evidence of right heart strain.  Mediastinum/Nodes: No mediastinal or hilar adenopathy. Lungs/Pleura: No pleural fluid. Moderate centrilobular and paraseptal emphysema. No pulmonary metastasis. Musculoskeletal: Degenerative changes of the left acromioclavicular joint are advanced. Lytic lesion within the left side of the T12 vertebral body measures 2.3 cm on 151/15 and 103/19. CT ABDOMEN FINDINGS Hepatobiliary: Mild cirrhosis. Multifocal hepatocellular carcinoma. Dominant mass or masses throughout the majority of the right lobe. Example at 10.2 x 14.6 cm on 47/10. At the site of a 5.0 x 4.3 cm lesion on 07/15/2018 (when remeasured). Left-sided lesions including a segment 2 hypervascular mass at 4.5 x 3.3 cm, new on 55/10. Normal gallbladder, without biliary ductal dilatation. Pancreas: Calcifications within the pancreatic head may relate to chronic calcific pancreatitis. No duct dilatation or acute inflammation. Spleen: Normal in size, without focal abnormality. Adrenals/Urinary Tract: Normal adrenal glands. Normal kidneys, without hydronephrosis. Stomach/Bowel: Normal stomach, without wall thickening. Normal colon, appendix, and terminal ileum. Normal small bowel. Vascular/Lymphatic: Advanced aortic and branch vessel atherosclerosis. 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 Right hepatic vein thrombus including on 143/15. Prominent porta hepatis nodes are likely reactive in the setting of cirrhosis, grossly similar to on the prior MRI. Other: No ascites.  No evidence of omental or peritoneal disease. Musculoskeletal: Right-sided L4 osseous and soft tissue mass including at 5.5 x 5.0 cm on 217/15. L 4-5 grade 1 anterolisthesis. IMPRESSION: 1. Significant progression of multifocal bilateral hepatocellular carcinoma. 2. Osseous metastasis, including a dominant right-sided L4 lesion. Consider further evaluation with lumbar and possibly thoracic spine MRI, given the size of the dominant right-sided L4 lesion. 3. Right-sided pulmonary  embolism, without evidence of right heart strain. 4. No evidence of metastatic disease within the chest. 5. 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 6. Aortic atherosclerosis (ICD10-I70.0), coronary artery atherosclerosis and emphysema (ICD10-J43.9). These results will be called to the ordering clinician or representative by the Radiologist Assistant, and communication documented in the PACS or [employer]. Electronically Signed   By: Abigail Miyamoto M.D.   On: 04/24/2020 12:32   MR THORACIC SPINE W WO CONTRAST  Result Date: 04/25/2020 CLINICAL DATA:  New thoracolumbar spine lesions on recent restaging CT scan for hepatocellular carcinoma. EXAM: MRI THORACIC AND LUMBAR SPINE WITHOUT AND WITH CONTRAST TECHNIQUE: Multiplanar and multiecho pulse sequences of the thoracic and lumbar spine were obtained without and with intravenous contrast. CONTRAST:  23mL GADAVIST GADOBUTROL 1 MMOL/ML IV SOLN COMPARISON:  CT  chest and abdomen from yesterday. FINDINGS: MRI THORACIC SPINE FINDINGS Alignment:  Physiologic. Vertebrae: 2.3 cm enhancing lesion in the T12 vertebral body. Additional 2.3 cm enhancing lesion in the right T9 pedicle and posterior elements. No fracture or evidence of discitis. Cord:  Normal signal and morphology.  No intradural enhancement. Paraspinal and other soft tissues: Negative. Disc levels: No significant disc bulge or herniation. No spinal canal stenosis. Mild right neuroforaminal stenosis at T9-T10 due to tumor. Mild left neuroforaminal stenosis at T10-T11 due to facet arthropathy. MRI LUMBAR SPINE FINDINGS Segmentation:  Standard. Alignment: Unchanged 9 mm anterolisthesis at L4-L5. Unchanged trace retrolisthesis at L5-S1. Vertebrae: Large metastatic lesion involving the right aspect of the L4 vertebral body extending into the paravertebral soft tissues, measuring 5.0 x 6.0 cm. Relatively diffuse enhancement of the other lumbar vertebral bodies. Partially visualized 4.0  x 2.9 cm lesion in the left sacral ala. Mild pathologic fracture involving the right aspect of the L4 vertebral body. No evidence of discitis. Schmorl's node along the L1 inferior endplate. Conus medullaris: Extends to the L1 level and appears normal. No intradural enhancement. Paraspinal and other soft tissues: Partially visualized large mass within the right hepatic lobe. Disc levels: T12-L1:  Negative. L1-L2:  No significant disc bulge or herniation.  No stenosis. L2-L3:  Mild disc bulging.  No stenosis. L3-L4: Moderate disc bulging. Mild bilateral facet arthropathy. Prominent posterior epidural fat. Right-sided epidural tumor extension. Severe spinal canal stenosis. Mild bilateral neuroforaminal stenosis. L4-L5: Disc uncovering and moderate disc bulging. Severe bilateral facet arthropathy with bilateral posterior paraspinous synovial cysts. Prominent posterior epidural fat. Severe spinal canal stenosis. Tumor extension into the right neural foramen. Moderate to severe right and mild left neuroforaminal stenosis. L5-S1: Moderate circumferential disc osteophyte complex. Mild bilateral facet arthropathy. Mild bilateral neuroforaminal stenosis. No spinal canal stenosis. IMPRESSION: Thoracic spine: 1. Metastatic lesions involving the T12 vertebral body and T9 right posterior elements. 2. Mild right neuroforaminal stenosis at T9-T10 due to tumor extension. Lumbar spine: 1. Large 5.0 x 6.0 cm metastatic lesion involving the right aspect of the L4 vertebral body with paravertebral and epidural tumor extension. 2. Partially visualized 4.0 x 2.9 cm metastatic lesion in the left sacral ala. 3. Relatively diffuse enhancement of the other lumbar vertebral bodies without focality, also concerning for metastatic disease. 4. Severe spinal canal stenosis at L3-L4 and L4-L5 due primarily to degenerative disc disease and prominent posterior epidural fat. 5. Moderate to severe right neuroforaminal stenosis at L4-L5 due to tumor  extension. Electronically Signed   By: Titus Dubin M.D.   On: 04/25/2020 15:07   MR Lumbar Spine W Wo Contrast  Result Date: 04/25/2020 CLINICAL DATA:  New thoracolumbar spine lesions on recent restaging CT scan for hepatocellular carcinoma. EXAM: MRI THORACIC AND LUMBAR SPINE WITHOUT AND WITH CONTRAST TECHNIQUE: Multiplanar and multiecho pulse sequences of the thoracic and lumbar spine were obtained without and with intravenous contrast. CONTRAST:  72mL GADAVIST GADOBUTROL 1 MMOL/ML IV SOLN COMPARISON:  CT chest and abdomen from yesterday. FINDINGS: MRI THORACIC SPINE FINDINGS Alignment:  Physiologic. Vertebrae: 2.3 cm enhancing lesion in the T12 vertebral body. Additional 2.3 cm enhancing lesion in the right T9 pedicle and posterior elements. No fracture or evidence of discitis. Cord:  Normal signal and morphology.  No intradural enhancement. Paraspinal and other soft tissues: Negative. Disc levels: No significant disc bulge or herniation. No spinal canal stenosis. Mild right neuroforaminal stenosis at T9-T10 due to tumor. Mild left neuroforaminal stenosis at T10-T11 due to facet arthropathy. MRI  LUMBAR SPINE FINDINGS Segmentation:  Standard. Alignment: Unchanged 9 mm anterolisthesis at L4-L5. Unchanged trace retrolisthesis at L5-S1. Vertebrae: Large metastatic lesion involving the right aspect of the L4 vertebral body extending into the paravertebral soft tissues, measuring 5.0 x 6.0 cm. Relatively diffuse enhancement of the other lumbar vertebral bodies. Partially visualized 4.0 x 2.9 cm lesion in the left sacral ala. Mild pathologic fracture involving the right aspect of the L4 vertebral body. No evidence of discitis. Schmorl's node along the L1 inferior endplate. Conus medullaris: Extends to the L1 level and appears normal. No intradural enhancement. Paraspinal and other soft tissues: Partially visualized large mass within the right hepatic lobe. Disc levels: T12-L1:  Negative. L1-L2:  No significant  disc bulge or herniation.  No stenosis. L2-L3:  Mild disc bulging.  No stenosis. L3-L4: Moderate disc bulging. Mild bilateral facet arthropathy. Prominent posterior epidural fat. Right-sided epidural tumor extension. Severe spinal canal stenosis. Mild bilateral neuroforaminal stenosis. L4-L5: Disc uncovering and moderate disc bulging. Severe bilateral facet arthropathy with bilateral posterior paraspinous synovial cysts. Prominent posterior epidural fat. Severe spinal canal stenosis. Tumor extension into the right neural foramen. Moderate to severe right and mild left neuroforaminal stenosis. L5-S1: Moderate circumferential disc osteophyte complex. Mild bilateral facet arthropathy. Mild bilateral neuroforaminal stenosis. No spinal canal stenosis. IMPRESSION: Thoracic spine: 1. Metastatic lesions involving the T12 vertebral body and T9 right posterior elements. 2. Mild right neuroforaminal stenosis at T9-T10 due to tumor extension. Lumbar spine: 1. Large 5.0 x 6.0 cm metastatic lesion involving the right aspect of the L4 vertebral body with paravertebral and epidural tumor extension. 2. Partially visualized 4.0 x 2.9 cm metastatic lesion in the left sacral ala. 3. Relatively diffuse enhancement of the other lumbar vertebral bodies without focality, also concerning for metastatic disease. 4. Severe spinal canal stenosis at L3-L4 and L4-L5 due primarily to degenerative disc disease and prominent posterior epidural fat. 5. Moderate to severe right neuroforaminal stenosis at L4-L5 due to tumor extension. Electronically Signed   By: Titus Dubin M.D.   On: 04/25/2020 15:07   CT ABDOMEN W WO CONTRAST  Result Date: 04/24/2020 CLINICAL DATA:  Hepatocellular carcinoma, restaging. EXAM: CT CHEST WITH CONTRAST CT ABDOMEN WITH AND WITHOUT CONTRAST TECHNIQUE: Multidetector CT imaging of the chest was performed during intravenous contrast administration. Multidetector CT imaging of the abdomen was performed following the  standard protocol before and during bolus administration of intravenous contrast. CONTRAST:  116mL ISOVUE-300 IOPAMIDOL (ISOVUE-300) INJECTION 61% COMPARISON:  PET of 08/11/2018. Abdominal MRI of 07/15/2018. No prior chest CT. FINDINGS: CT CHEST FINDINGS Cardiovascular: Aortic atherosclerosis. Tortuous thoracic aorta. Normal heart size, without pericardial effusion. Lad coronary artery calcification. Right lower lobe lobar and segmental nearly occlusive pulmonary emboli, including on 91/15 and 99/17. No evidence of right heart strain. Mediastinum/Nodes: No mediastinal or hilar adenopathy. Lungs/Pleura: No pleural fluid. Moderate centrilobular and paraseptal emphysema. No pulmonary metastasis. Musculoskeletal: Degenerative changes of the left acromioclavicular joint are advanced. Lytic lesion within the left side of the T12 vertebral body measures 2.3 cm on 151/15 and 103/19. CT ABDOMEN FINDINGS Hepatobiliary: Mild cirrhosis. Multifocal hepatocellular carcinoma. Dominant mass or masses throughout the majority of the right lobe. Example at 10.2 x 14.6 cm on 47/10. At the site of a 5.0 x 4.3 cm lesion on 07/15/2018 (when remeasured). Left-sided lesions including a segment 2 hypervascular mass at 4.5 x 3.3 cm, new on 55/10. Normal gallbladder, without biliary ductal dilatation. Pancreas: Calcifications within the pancreatic head may relate to chronic calcific pancreatitis. No duct dilatation  or acute inflammation. Spleen: Normal in size, without focal abnormality. Adrenals/Urinary Tract: Normal adrenal glands. Normal kidneys, without hydronephrosis. Stomach/Bowel: Normal stomach, without wall thickening. Normal colon, appendix, and terminal ileum. Normal small bowel. Vascular/Lymphatic: Advanced aortic and branch vessel atherosclerosis. 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 Right hepatic vein thrombus including on 143/15. Prominent porta hepatis nodes are likely reactive in the setting of cirrhosis,  grossly similar to on the prior MRI. Other: No ascites.  No evidence of omental or peritoneal disease. Musculoskeletal: Right-sided L4 osseous and soft tissue mass including at 5.5 x 5.0 cm on 217/15. L 4-5 grade 1 anterolisthesis. IMPRESSION: 1. Significant progression of multifocal bilateral hepatocellular carcinoma. 2. Osseous metastasis, including a dominant right-sided L4 lesion. Consider further evaluation with lumbar and possibly thoracic spine MRI, given the size of the dominant right-sided L4 lesion. 3. Right-sided pulmonary embolism, without evidence of right heart strain. 4. No evidence of metastatic disease within the chest. 5. 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 6. Aortic atherosclerosis (ICD10-I70.0), coronary artery atherosclerosis and emphysema (ICD10-J43.9). These results will be called to the ordering clinician or representative by the Radiologist Assistant, and communication documented in the PACS or [employer]. Electronically Signed   By: Abigail Miyamoto M.D.   On: 04/24/2020 12:32       IMPRESSION/PLAN: 1. Recurrent metastatic hepatocellular carcinoma involving the sacrum, lumbar, and thoracic spine. Dr. Lisbeth Renshaw discusses the pathology findings and reviews the nature of metastatic disease to the bones and reviews the rationale to proceed with radiotherapy for palliative purposes. We discussed the risks, benefits, short, and long term effects of radiotherapy, and the patient is interested in proceeding. Dr. Lisbeth Renshaw discusses the delivery and logistics of radiotherapy and anticipates a course of 2 weeks of radiotherapy. He will come in tomorrow for simulation at 9 am at which time he will sign written consent to proceed. He will continue taking dexamethasone and we will taper this at the completion of treatment. 2. Pain secondary to #1.  The patient was given a prescription for long and short acting oxycodone from the hospital and he will continue this  prescription. We will follow this expectantly.   Given current concerns for patient exposure during the COVID-19 pandemic, this encounter was conducted via telephone.  The patient has provided two factor identification and has given verbal consent for this type of encounter and has been advised to only accept a meeting of this type in a secure network environment. The time spent during this encounter was 45 minutes including preparation, discussion, and coordination of the patient's care. The attendants for this meeting include Blenda Nicely, RN, Dr. Lisbeth Renshaw, Hayden Pedro  and Conni Slipper.  During the encounter,  Blenda Nicely, RN, Dr. Lisbeth Renshaw, and Hayden Pedro were located at [facility] Radiation Oncology Department.  Conni Slipper was located at home.    The above documentation reflects my direct findings during this shared patient visit. Please see the separate note by Dr. Lisbeth Renshaw on this date for the remainder of the patient's plan of care.    Carola Rhine, PAC

## 2020-05-01 ENCOUNTER — Other Ambulatory Visit: Payer: Self-pay

## 2020-05-01 ENCOUNTER — Telehealth: Payer: Self-pay | Admitting: Hematology

## 2020-05-01 ENCOUNTER — Ambulatory Visit
Admission: RE | Admit: 2020-05-01 | Discharge: 2020-05-01 | Disposition: A | Discharge status: Home / Self Care | Payer: Medicaid Other | Source: Ambulatory Visit | Attending: Radiation Oncology | Admitting: Radiation Oncology

## 2020-05-01 DIAGNOSIS — C7951 Secondary malignant neoplasm of bone: Secondary | ICD-10-CM | POA: Insufficient documentation

## 2020-05-01 DIAGNOSIS — C220 Liver cell carcinoma: Secondary | ICD-10-CM | POA: Diagnosis not present

## 2020-05-01 DIAGNOSIS — Z510 Encounter for antineoplastic radiation therapy: Secondary | ICD-10-CM | POA: Insufficient documentation

## 2020-05-01 NOTE — Telephone Encounter (Signed)
Scheduled appt per Staff message from Dr. Burr Medico. Pt is aware of appts on 6/4

## 2020-05-01 NOTE — Progress Notes (Signed)
[facility]   [phone] [phone]) [phone]   Clinic Follow up Note   Patient Care Team: Mikey College, NP (Inactive) as PCP - General (Nurse Practitioner) Clent Jacks, RN as Registered Nurse Truitt Merle, MD as Consulting Physician (Hematology) Jonnie Finner, RN as Oncology Nurse Navigator  Date of Service:  05/03/2020  CHIEF COMPLAINT: F/u of PE and Multifocal [place] metastatic to bone   SUMMARY OF ONCOLOGIC HISTORY: Oncology History Overview Note  Cancer Staging No matching staging information was found for the patient.    Hepatocellular carcinoma ([place])  07/15/2018 Imaging   MRI Liver  IMPRESSION: Multiple hypervascular liver masses, highly suspicious for malignancy. Differential diagnosis includes multifocal hepatocellular carcinoma and metastatic disease. Consider ultrasound-guided percutaneous needle biopsy for tissue diagnosis.   No other abdominal soft tissue masses or lymphadenopathy.   07/29/2018 Initial Diagnosis   Hepatocellular carcinoma ([place])   08/11/2018 PET scan   IMPRESSION: No hypermetabolic masses identified within the liver, or elsewhere within the neck, chest, abdomen, or pelvis. This makes hepatic metastases extremely unlikely, however multifocal hepatocellular carcinoma cannot be excluded. Recommend correlation with tumor markers, and consider ultrasound-guided needle biopsy for tissue diagnosis.   09/08/2018 Initial Biopsy   DIAGNOSIS:  A. LIVER MASS; ULTRASOUND-GUIDED BIOPSY:  - HEPATOCELLULAR CARCINOMA, MODERATELY DIFFERENTIATED (WHO 2019).  - BACKGROUND LIVER WITH FOCALLY INTENSE LYMPHOPLASMACYTIC INFILTRATE AND  AREAS WORRISOME FOR PARENCHYMAL NECROSIS, SEE COMMENT.   Comment:  The patient's history of hepatitis C is noted.  The background liver  demonstrates areas of intense lymphoplasmacytic inflammation with brisk  piecemeal necrosis.  Areas worrisome for parenchymal necrosis are  present.   These findings raise the possibility of an underlying and  concurrent autoimmune hepatitis.  Recommend autoimmune hepatitis  serologies (e.g. ANA).  These findings were discussed with Dr. Mike Gip  on 09/09/2018.    Bone metastasis ([place])  04/24/2020 Progression   CT CAP IMPRESSION: 1. Significant progression of multifocal bilateral hepatocellular carcinoma. 2. Osseous metastasis, including a dominant right-sided L4 lesion. Consider further evaluation with lumbar and possibly thoracic spine MRI, given the size of the dominant right-sided L4 lesion. 3. Right-sided pulmonary embolism, without evidence of right heart strain. 4. No evidence of metastatic disease within the chest. 5. 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 6. Aortic atherosclerosis (ICD10-I70.0), coronary artery atherosclerosis and emphysema (ICD10-J43.9).   04/25/2020 Imaging   MRI Lumbar and Thoracic Spine  IMPRESSION: Thoracic spine:   1. Metastatic lesions involving the T12 vertebral body and T9 right posterior elements. 2. Mild right neuroforaminal stenosis at T9-T10 due to tumor extension.   Lumbar spine:   1. Large 5.0 x 6.0 cm metastatic lesion involving the right aspect of the L4 vertebral body with paravertebral and epidural tumor extension. 2. Partially visualized 4.0 x 2.9 cm metastatic lesion in the left sacral ala. 3. Relatively diffuse enhancement of the other lumbar vertebral bodies without focality, also concerning for metastatic disease. 4. Severe spinal canal stenosis at L3-L4 and L4-L5 due primarily to degenerative disc disease and prominent posterior epidural fat. 5. Moderate to severe right neuroforaminal stenosis at L4-L5 due to tumor extension.   04/30/2020 Initial Diagnosis   Bone metastasis ([place])      CURRENT THERAPY:  Palliative RT, pending lenvatinib   INTERVAL HISTORY:  Vernon [name] is here for a follow up as outpatient. He presents to the  clinic with his daughter.  Patient is a wheelchair.  I saw him in the hospital during his  recent admission for PE and left back pain.  He has started palliative radiation yesterday.  He states his pain is not controlled, he is taking OxyContin 10 mg twice daily, and oxycodone 10 mg every 4 hours.  He has normal bowel movement, denies any hematochezia or melena.  No abdominal pain, chest discomfort, or other new symptoms.  His appetite is normal, he is able to ambulate at home without cane or walker.   ROS otherwise negative.  MEDICAL HISTORY:  Past Medical History:  Diagnosis Date  . Gout   . Hepatitis C   . Hepatocellular carcinoma ([place])     SURGICAL HISTORY: History reviewed. No pertinent surgical history.  I have reviewed the social history and family history with the patient and they are unchanged from previous note.  ALLERGIES:  has No Known Allergies.  MEDICATIONS:  Current Outpatient Medications  Medication Sig Dispense Refill  . apixaban (ELIQUIS) 5 MG TABS tablet Take 2 tablets (10 mg total) by mouth 2 (two) times daily for 7 days, THEN 1 tablet (5 mg total) 2 (two) times daily for 23 days. 74 tablet 0  . cyclobenzaprine (FLEXERIL) 10 MG tablet Take 1 tablet (10 mg total) by mouth at bedtime. (Patient not taking: Reported on 04/24/2020) 20 tablet 1  . dexamethasone (DECADRON) 2 MG tablet Take 1 tablet (2 mg total) by mouth every 12 (twelve) hours. 30 tablet 0  . ferrous sulfate 325 (65 FE) MG tablet Take 1 tablet (325 mg total) by mouth 2 (two) times daily with a meal. 60 tablet 0  . Lenvatinib 12 mg daily dose (LENVIMA) 3 x 4 MG capsule Take 12 mg by mouth daily. (Patient not taking: Reported on 04/24/2020) 90 capsule 1  . oxyCODONE (OXYCONTIN) 15 mg 12 hr tablet Take 1 tablet (15 mg total) by mouth every 12 (twelve) hours. 60 tablet 0  . Oxycodone HCl 10 MG TABS Take 1 tablet (10 mg total) by mouth every 4 (four) hours as needed. 90 tablet 0  . polyethylene glycol (MIRALAX /  GLYCOLAX) 17 g packet Take 17 g by mouth daily as needed for mild constipation. 14 each 0  . senna-docusate (SENOKOT-S) 8.6-50 MG tablet Take 2 tablets by mouth at bedtime as needed for mild constipation or moderate constipation. 30 tablet 0   No current facility-administered medications for this visit.    PHYSICAL EXAMINATION: ECOG PERFORMANCE STATUS: 2 - Symptomatic, <50% confined to bed  Vitals:   05/03/20 0803  BP: (!) 145/88  Pulse: 89  Resp: 20  Temp: 97.9 F (36.6 C)  SpO2: 98%   Filed Weights   05/03/20 0803  Weight: 171 lb 1.6 oz (77.6 kg)    GENERAL:alert, no distress and comfortable SKIN: skin color, texture, turgor are normal, no rashes or significant lesions EYES: normal, Conjunctiva are pink and non-injected, sclera clear NECK: supple, thyroid normal size, non-tender, without nodularity LYMPH:  no palpable lymphadenopathy in the cervical, axillary  LUNGS: clear to auscultation and percussion with normal breathing effort HEART: regular rate & rhythm and no murmurs and no lower extremity edema ABDOMEN:abdomen soft, non-tender and normal bowel sounds Musculoskeletal:no cyanosis of digits and no clubbing, no leg edema  NEURO: alert & oriented x 3 with fluent speech, no focal motor/sensory deficits  LABORATORY DATA:  I have reviewed the data as listed CBC Latest Ref Rng & Units 05/03/2020 04/26/2020 04/25/2020  WBC 4.0 - 10.5 K/uL 7.5 6.3 5.8  Hemoglobin 13.0 - 17.0 g/dL 8.8(L) 8.4(L) 8.5(L)  Hematocrit  39.0 - 52.0 % 28.6(L) 28.3(L) 28.0(L)  Platelets 150 - 400 K/uL 285 260 271     CMP Latest Ref Rng & Units 05/03/2020 04/26/2020 04/24/2020  Glucose 70 - 99 mg/dL 110(H) 98 85  BUN 8 - 23 mg/dL '21 9 11  '$ Creatinine 0.61 - 1.24 mg/dL 1.05 1.19 1.24  Sodium 135 - 145 mmol/L 139 137 134(L)  Potassium 3.5 - 5.1 mmol/L 4.2 4.6 4.3  Chloride 98 - 111 mmol/L 103 106 103  CO2 22 - 32 mmol/L '23 23 22  '$ Calcium 8.9 - 10.3 mg/dL 9.4 9.1 9.1  Total Protein 6.5 - 8.1 g/dL 7.4  7.0 7.2  Total Bilirubin 0.3 - 1.2 mg/dL 0.4 0.9 1.3(H)  Alkaline Phos 38 - 126 U/L 225(H) 200(H) 205(H)  AST 15 - 41 U/L 93(H) 106(H) 103(H)  ALT 0 - 44 U/L 113(H) 76(H) 81(H)      RADIOGRAPHIC STUDIES: I have personally reviewed the radiological images as listed and agreed with the findings in the report. No results found.   ASSESSMENT & PLAN:  Vernon [name] is a 59 y.o. male with gout, hepatitis C, and [place] diagnosed in 2019, but has not been treated    1. Multifocal HCC, Metastatic to bone,,stage IV  -He was initially diagnosed in 08/2018 then lost follow up after 09/14/18.  Presented to hospital with significant left hip pain from L4 bone metastasis,  at this visit, his biopsy, lab, and imaging results were discussed with him.  Systemic treatment was discussed (sorafenib versus lenvatinib).  -Due to worsening right hip pain he had work up done by his PCP. His 04/24/20 CT scan results show significant progression of multifocal bilateral hepatocellular carcinoma, osseous metastasis including a dominant right-sided L4 lesion, right-sided PE without evidence of heart strain, no evidence of metastatic disease in the chest, right portal and hepatic vein occlusion presumably by tumor thrombus.  -MRI thoracic and lumbar spine from 04/25/20 showed multiple bone mets, I reviewed with pt   -bone scan is still pending  -He has started palliative radiation to his L4 lesion and T9-10 lesions yesterday -I discussed the systemic therapy option, including TKIs such as sorafenib or lenvatinib, or immunotherapy with or without bevacizumab.  Also I recommend intravenous Tecentriq and bevacizumab as first-line therapy, patient is strongly in favor of oral treatment. I prescribed lenvatinib. Plan to start after he complete RT.  -He will start Lenvatinib at '4mg'$  daily, if tolerate well, will increase to '8mg'$  in 3-4 days then full dose '12mg'$  a week after   2. Right hip pain secondary to L4 bone met  -For the  past several months he has been experiencing right hip pain which has gradually worsened.  Pain is now severe, throbbing in quality, and radiating down the right thigh.  It is worse with movement.   -I will increase his OxyContin from 10 mg to 15 mg every 12 hours, and oxycodone 10 mg every 4-6 hours, I refilled both today -I anticipate his pain was improve with radiation   3. PE  -Seen on 04/24/20 CT Chest, incidentally.  -Continue Eliquis 5 mg twice daily -We discussed the risk of GI bleeding, especially in the setting of liver cirrhosis -We will monitor closely  4. Anemia, IDA  -He has moderate anemia and iron deficiency, he has received one dose iv feraheme on 04/25/20, will give second dose in my office today .  -I referred him to GI for EGD after discharge. We discussed high risk of bleeding  from anticoagulation. Will monitor closely.    5. Hepatitis C and liver cirrhosis  -He was referred to liver clinic for management of his hepatitis C.  He has never received treatment for hepatitis C. He has never had a prior EGD.  -Continue follow-up with liver clinic NP Dawn  -will refer to [facility] to establish care and EGD.  Due to his recent chemo, EGD may not be done until 2-3 months later, sooner if GI bleeding    6. Social support  -The patient is single but lives with his girlfriend.  He has 3 children. -His daughter will be his main caregiver, will fill out her FMLA paperwork -Refer to social worker    PLAN:  -We will increase his oxycodone and OxyContin, prescriptions for codeine today -Refer to [facility]  -SW referral  -continue RT -anticipate to start Lenvatinib on 6/21, start at '4mg'$  daily and increase to '12mg'$  daily in 2 weeks if tolerates well  -lab in 2 weeks -lab and f/u in 4 weeks   No problem-specific Assessment & Plan notes found for this encounter.   Orders Placed This Encounter  Procedures  . Ambulatory referral to Gastroenterology    Referral Priority:    Routine    Referral Type:   Consultation    Referral Reason:   Specialty Services Required    Number of Visits Requested:   1  . Ambulatory referral to Social Work    Referral Priority:   Routine    Referral Type:   Consultation    Referral Reason:   Specialty Services Required    Number of Visits Requested:   1   All questions were answered. The patient knows to call the clinic with any problems, questions or concerns. No barriers to learning was detected. The total time spent in the appointment was 40 minutes.     Truitt Merle, MD 05/03/2020   I, Joslyn Devon, am acting as scribe for Truitt Merle, MD.   I have reviewed the above documentation for accuracy and completeness, and I agree with the above.

## 2020-05-02 ENCOUNTER — Ambulatory Visit
Admission: RE | Admit: 2020-05-02 | Discharge: 2020-05-02 | Disposition: A | Discharge status: Home / Self Care | Payer: Medicaid Other | Source: Ambulatory Visit | Attending: Radiation Oncology | Admitting: Radiation Oncology

## 2020-05-02 ENCOUNTER — Encounter (HOSPITAL_COMMUNITY): Payer: Self-pay

## 2020-05-02 ENCOUNTER — Ambulatory Visit (HOSPITAL_COMMUNITY): Payer: Medicaid Other

## 2020-05-02 ENCOUNTER — Other Ambulatory Visit: Payer: Self-pay

## 2020-05-02 ENCOUNTER — Encounter (HOSPITAL_COMMUNITY): Payer: Medicaid Other

## 2020-05-02 DIAGNOSIS — Z510 Encounter for antineoplastic radiation therapy: Secondary | ICD-10-CM | POA: Diagnosis not present

## 2020-05-02 DIAGNOSIS — C220 Liver cell carcinoma: Secondary | ICD-10-CM

## 2020-05-02 DIAGNOSIS — D500 Iron deficiency anemia secondary to blood loss (chronic): Secondary | ICD-10-CM

## 2020-05-03 ENCOUNTER — Inpatient Hospital Stay: Payer: Medicaid Other

## 2020-05-03 ENCOUNTER — Ambulatory Visit
Admission: RE | Admit: 2020-05-03 | Discharge: 2020-05-03 | Disposition: A | Discharge status: Home / Self Care | Payer: Medicaid Other | Source: Ambulatory Visit | Attending: Radiation Oncology | Admitting: Radiation Oncology

## 2020-05-03 ENCOUNTER — Other Ambulatory Visit: Payer: Self-pay

## 2020-05-03 ENCOUNTER — Encounter: Payer: Self-pay | Admitting: Hematology

## 2020-05-03 ENCOUNTER — Telehealth: Payer: Self-pay

## 2020-05-03 ENCOUNTER — Other Ambulatory Visit: Payer: Self-pay | Admitting: Hematology

## 2020-05-03 ENCOUNTER — Inpatient Hospital Stay (HOSPITAL_BASED_OUTPATIENT_CLINIC_OR_DEPARTMENT_OTHER): Payer: Medicaid Other | Admitting: Hematology

## 2020-05-03 ENCOUNTER — Inpatient Hospital Stay: Payer: Medicaid Other | Attending: Hematology

## 2020-05-03 VITALS — BP 137/77 | HR 77 | Temp 98.1°F | Resp 18

## 2020-05-03 VITALS — BP 145/88 | HR 89 | Temp 97.9°F | Resp 20 | Ht 66.0 in | Wt 171.1 lb

## 2020-05-03 DIAGNOSIS — C220 Liver cell carcinoma: Secondary | ICD-10-CM

## 2020-05-03 DIAGNOSIS — C7951 Secondary malignant neoplasm of bone: Secondary | ICD-10-CM

## 2020-05-03 DIAGNOSIS — Z510 Encounter for antineoplastic radiation therapy: Secondary | ICD-10-CM | POA: Diagnosis not present

## 2020-05-03 DIAGNOSIS — D509 Iron deficiency anemia, unspecified: Secondary | ICD-10-CM | POA: Insufficient documentation

## 2020-05-03 DIAGNOSIS — D500 Iron deficiency anemia secondary to blood loss (chronic): Secondary | ICD-10-CM

## 2020-05-03 LAB — CMP (CANCER CENTER ONLY)
ALT: 113 U/L — ABNORMAL HIGH (ref 0–44)
AST: 93 U/L — ABNORMAL HIGH (ref 15–41)
Albumin: 3.1 g/dL — ABNORMAL LOW (ref 3.5–5.0)
Alkaline Phosphatase: 225 U/L — ABNORMAL HIGH (ref 38–126)
Anion gap: 13 (ref 5–15)
BUN: 21 mg/dL (ref 8–23)
CO2: 23 mmol/L (ref 22–32)
Calcium: 9.4 mg/dL (ref 8.9–10.3)
Chloride: 103 mmol/L (ref 98–111)
Creatinine: 1.05 mg/dL (ref 0.61–1.24)
GFR, Est AFR Am: >60 mL/min (ref >60)
GFR, Estimated: >60 mL/min (ref >60)
Glucose, Bld: 110 mg/dL — ABNORMAL HIGH (ref 70–99)
Potassium: 4.2 mmol/L (ref 3.5–5.1)
Sodium: 139 mmol/L (ref 135–145)
Total Bilirubin: 0.4 mg/dL (ref 0.3–1.2)
Total Protein: 7.4 g/dL (ref 6.5–8.1)

## 2020-05-03 LAB — CBC WITH DIFFERENTIAL (CANCER CENTER ONLY)
Abs Immature Granulocytes: 0.05 10*3/uL (ref 0.00–0.07)
Basophils Absolute: 0 10*3/uL (ref 0.0–0.1)
Basophils Relative: 0 %
Eosinophils Absolute: 0 10*3/uL (ref 0.0–0.5)
Eosinophils Relative: 0 %
HCT: 28.6 % — ABNORMAL LOW (ref 39.0–52.0)
Hemoglobin: 8.8 g/dL — ABNORMAL LOW (ref 13.0–17.0)
Immature Granulocytes: 1 %
Lymphocytes Relative: 20 %
Lymphs Abs: 1.5 10*3/uL (ref 0.7–4.0)
MCH: 27.9 pg (ref 26.0–34.0)
MCHC: 30.8 g/dL (ref 30.0–36.0)
MCV: 90.8 fL (ref 80.0–100.0)
Monocytes Absolute: 0.7 10*3/uL (ref 0.1–1.0)
Monocytes Relative: 9 %
Neutro Abs: 5.2 10*3/uL (ref 1.7–7.7)
Neutrophils Relative %: 70 %
Platelet Count: 285 10*3/uL (ref 150–400)
RBC: 3.15 MIL/uL — ABNORMAL LOW (ref 4.22–5.81)
RDW: 20.9 % — ABNORMAL HIGH (ref 11.5–15.5)
WBC Count: 7.5 10*3/uL (ref 4.0–10.5)
nRBC: 0.3 % — ABNORMAL HIGH (ref 0.0–0.2)

## 2020-05-03 LAB — IRON AND TIBC
Iron: 467 ug/dL — ABNORMAL HIGH (ref 42–163)
Saturation Ratios: 92 % — ABNORMAL HIGH (ref 20–55)
TIBC: 506 ug/dL — ABNORMAL HIGH (ref 202–409)
UIBC: 39 ug/dL — ABNORMAL LOW (ref 117–376)

## 2020-05-03 LAB — FERRITIN: Ferritin: 936 ng/mL — ABNORMAL HIGH (ref 24–336)

## 2020-05-03 MED ORDER — OXYCODONE HCL 10 MG PO TABS: 10.0000 mg | ORAL_TABLET | ORAL | 0 refills | Status: DC | PRN

## 2020-05-03 MED ORDER — SODIUM CHLORIDE 0.9 % IV SOLN
510.0000 mg | Freq: Once | INTRAVENOUS | Status: AC
  Administered 2020-05-03: 510 mg via INTRAVENOUS
  Filled 2020-05-03: qty 510

## 2020-05-03 MED ORDER — LORATADINE 10 MG PO TABS
ORAL_TABLET | ORAL | Status: AC
  Filled 2020-05-03: qty 1

## 2020-05-03 MED ORDER — SODIUM CHLORIDE 0.9 % IV SOLN
Freq: Once | INTRAVENOUS | Status: AC
  Filled 2020-05-03: qty 250

## 2020-05-03 MED ORDER — LORATADINE 10 MG PO TABS
10.0000 mg | ORAL_TABLET | Freq: Once | ORAL | Status: AC
  Administered 2020-05-03: 10 mg via ORAL

## 2020-05-03 MED ORDER — ACETAMINOPHEN 325 MG PO TABS
650.0000 mg | ORAL_TABLET | Freq: Once | ORAL | Status: AC
  Administered 2020-05-03: 650 mg via ORAL

## 2020-05-03 MED ORDER — ACETAMINOPHEN 325 MG PO TABS
ORAL_TABLET | ORAL | Status: AC
  Filled 2020-05-03: qty 2

## 2020-05-03 MED ORDER — OXYCODONE HCL ER 15 MG PO T12A: 15.0000 mg | EXTENDED_RELEASE_TABLET | Freq: Two times a day (BID) | ORAL | 0 refills | Status: DC

## 2020-05-03 NOTE — Patient Instructions (Signed)

## 2020-05-03 NOTE — Telephone Encounter (Signed)
Spoke with Ron at [facility] in [place] as patient had tried to pick up his new scripts for Oxycontin and Oxycodone and they said they needed some sort of code.  Pharmacy was needing ICD10 codes and they were provided.  Per pharmacist they will go ahead and get these ready for the patient and I let him know.

## 2020-05-03 NOTE — Progress Notes (Signed)
Spoke with patient's daughter regarding rescheduling bone scan.  Scheduled on 6/21 at 10:30 at [facility].  She verbalized an understanding and was able to repeat this back to me.

## 2020-05-03 NOTE — Progress Notes (Signed)
Met with patient and his daughter Delana Meyer on medical oncology appointment with Dr. Burr Medico today.  I explained my role as nurse navigator and they were given my direct phone number and encouraged to call if have any questions or concerns.  He was given information by Dr. Burr Medico on Leuvatinib and the planned start date is 6/21 after he finishes radiation.  I have referred him to SW as he most likely needs home DME such as rolling walker and shower bench. They both verbalized an understanding.  Also he was scheduled for a bone scan and was unaware of his appointment, we will reschedule.

## 2020-05-06 ENCOUNTER — Telehealth: Payer: Self-pay | Admitting: Hematology

## 2020-05-06 ENCOUNTER — Ambulatory Visit
Admission: RE | Admit: 2020-05-06 | Discharge: 2020-05-06 | Disposition: A | Discharge status: Home / Self Care | Payer: Medicaid Other | Source: Ambulatory Visit | Attending: Radiation Oncology | Admitting: Radiation Oncology

## 2020-05-06 ENCOUNTER — Other Ambulatory Visit: Payer: Self-pay

## 2020-05-06 DIAGNOSIS — Z510 Encounter for antineoplastic radiation therapy: Secondary | ICD-10-CM | POA: Diagnosis not present

## 2020-05-06 NOTE — Telephone Encounter (Signed)
Scheduled appt per 6/4 los.  Spoke with pt and he is aware of the appt date and time. 

## 2020-05-07 ENCOUNTER — Encounter: Payer: Self-pay | Admitting: General Practice

## 2020-05-07 ENCOUNTER — Other Ambulatory Visit: Payer: Self-pay

## 2020-05-07 ENCOUNTER — Ambulatory Visit
Admission: RE | Admit: 2020-05-07 | Discharge: 2020-05-07 | Disposition: A | Discharge status: Home / Self Care | Payer: Medicaid Other | Source: Ambulatory Visit | Attending: Radiation Oncology | Admitting: Radiation Oncology

## 2020-05-07 DIAGNOSIS — Z510 Encounter for antineoplastic radiation therapy: Secondary | ICD-10-CM | POA: Diagnosis not present

## 2020-05-07 NOTE — Progress Notes (Signed)
[facility] Initial Psychosocial Assessment Clinical Social Work  Clinical Social Work contacted by phone to assess psychosocial, emotional, mental health, and spiritual needs of the patient.   Barriers to care/review of distress screen:  - Transportation:  Do you anticipate any problems getting to appointments?  Do you have someone who can help run errands for you if you need it? Daughter drives to appointments.   - Help at home:  What is your living situation (alone, family, other)?  If you are physically unable to care for yourself, who would you call on to help you?  Lives w male friend.   - Support system:  What does your support system look like?  Who would you call on if you needed some kind of practical help?  What if you needed someone to talk to for emotional support?  Daughter and friends help w errands, "whatever I need she will get it for me." - Finances:  Are you concerned about finances.  Considering returning to work?  If not, applying for disability?  On disability, cannot work at this point.  Is behind on bills.  Has Medicaid.  Will refer for [employer] grant assessment, "it costs my daughter pretty good to afford all the gas, I am behind on light bill."  Uses disability check to pay for utilities and food - is often behind.    What is your understanding of where you are with your cancer? Its cause?  Your treatment plan and what happens next?  Newly diagnosed w liver cancer, will have radiation tx first, has "blockage in my lung, they gave me IV stuff and now I have pills to take for it."  Had liver cancer in 2019 "they thought that they found some spots on my liver, thought it could be Hep C or cancer.  Was supposed to refer him to liver specialist at [facility], but 'it never happened.'"  Is now "leaving it in the hands of [facility]", trusts care at [facility].  Also has palliative care consult scheduled - has not happened to date.  What are your worries for the future as you begin treatment for cancer?  "I  just want to get myself caught up, it would relax me a whole lot more."  Stresses about unpaid bills and inability to work.   Has fatigue.    What are your hopes and priorities during your treatment? What is important to you? What are your goals for your care?  Get strength back and resume his normal life.  Will not return to former job.  Wants to be able to spend time w his grandchildren and enjoy them.    CSW Summary:  Patient and family psychosocial functioning including strengths, limitations, and coping skills: 59 yo male, diagnosed w liver cancer several years ago, was lost to follow up.  Now coming for treatment of Stage IV liver cancer.  His understanding of his prior treatment history was that he needed to be referred to a liver specialist, but the referral did not "happen."  He is now in treatment w radiation therapy - he is unsure about what will happen after he finishes radiation.  He lives w a male friend, daughter is supportive and provides help w transportation and "gets me whatever I need."  Finances are tight as he lives on a fixed income.  CSW and patient discussed common feeling and emotions when being diagnosed with cancer, and the importance of support during treatment. CSW informed patient of the support team and  support services at [facility]. CSW encouraged patient to call with any questions or concerns.  Identifications of barriers to care:  Distance from [facility] and difficulty affording gas.  Managing treatment for blood clots as well as cancer.  Somewhat limited health literacy, may not always understand what needs to happen in terms of follow ups.  Limited income.    Availability of community resources:  [employer], palliative care, home health for DME needs, [place].   Clinical Social Worker follow up needed: No.  Please reconsult as needs arise.  Edwyna Shell, LCSW Clinical Social Worker Phone:  [phone] Cell:  [phone]

## 2020-05-08 ENCOUNTER — Other Ambulatory Visit: Payer: Self-pay

## 2020-05-08 ENCOUNTER — Telehealth: Payer: Self-pay | Admitting: *Deleted

## 2020-05-08 ENCOUNTER — Ambulatory Visit
Admission: RE | Admit: 2020-05-08 | Discharge: 2020-05-08 | Disposition: A | Discharge status: Home / Self Care | Payer: Medicaid Other | Source: Ambulatory Visit | Attending: Radiation Oncology | Admitting: Radiation Oncology

## 2020-05-08 DIAGNOSIS — C220 Liver cell carcinoma: Secondary | ICD-10-CM

## 2020-05-08 DIAGNOSIS — Z510 Encounter for antineoplastic radiation therapy: Secondary | ICD-10-CM | POA: Diagnosis not present

## 2020-05-08 DIAGNOSIS — C7951 Secondary malignant neoplasm of bone: Secondary | ICD-10-CM

## 2020-05-08 LAB — AFP TUMOR MARKER: AFP, Serum, Tumor Marker: 967 ng/mL — ABNORMAL HIGH (ref 0.0–8.3)

## 2020-05-08 NOTE — Telephone Encounter (Signed)
Message left for Vernon [name]' daughter requesting return call in reference to [facility] form received by this nurse 05/06/2020.    Need correct form to complete.  FMLA eligibility reads "Leave needed for your own serious health condition" should read "You are son or daughter of".

## 2020-05-09 ENCOUNTER — Other Ambulatory Visit: Payer: Self-pay

## 2020-05-09 ENCOUNTER — Ambulatory Visit
Admission: RE | Admit: 2020-05-09 | Discharge: 2020-05-09 | Disposition: A | Discharge status: Home / Self Care | Payer: Medicaid Other | Source: Ambulatory Visit | Attending: Radiation Oncology | Admitting: Radiation Oncology

## 2020-05-09 DIAGNOSIS — Z510 Encounter for antineoplastic radiation therapy: Secondary | ICD-10-CM | POA: Diagnosis not present

## 2020-05-10 ENCOUNTER — Other Ambulatory Visit: Payer: Self-pay

## 2020-05-10 ENCOUNTER — Encounter: Payer: Self-pay | Admitting: Radiation Oncology

## 2020-05-10 ENCOUNTER — Telehealth: Payer: Self-pay | Admitting: Nurse Practitioner

## 2020-05-10 ENCOUNTER — Ambulatory Visit
Admission: RE | Admit: 2020-05-10 | Discharge: 2020-05-10 | Disposition: A | Discharge status: Home / Self Care | Payer: Medicaid Other | Source: Ambulatory Visit | Attending: Radiation Oncology | Admitting: Radiation Oncology

## 2020-05-10 DIAGNOSIS — Z510 Encounter for antineoplastic radiation therapy: Secondary | ICD-10-CM | POA: Diagnosis not present

## 2020-05-10 NOTE — Telephone Encounter (Signed)
Phone call placed to patient to offer to schedule a visit with [facility]. Phone rang, with no answer I left a voicemail for call back. 

## 2020-05-10 NOTE — Progress Notes (Signed)
Vernon [name] brought in FMLA paperwork for his daughter to be completed.  Put in orange folder for pick up.

## 2020-05-10 NOTE — Progress Notes (Signed)
Met with patient, signed him up for [employer], gave him card to call with any additional questions.

## 2020-05-13 ENCOUNTER — Other Ambulatory Visit: Payer: Self-pay

## 2020-05-13 ENCOUNTER — Ambulatory Visit
Admission: RE | Admit: 2020-05-13 | Discharge: 2020-05-13 | Disposition: A | Discharge status: Home / Self Care | Payer: Medicaid Other | Source: Ambulatory Visit | Attending: Radiation Oncology | Admitting: Radiation Oncology

## 2020-05-13 DIAGNOSIS — Z510 Encounter for antineoplastic radiation therapy: Secondary | ICD-10-CM | POA: Diagnosis not present

## 2020-05-14 ENCOUNTER — Ambulatory Visit
Admission: RE | Admit: 2020-05-14 | Discharge: 2020-05-14 | Disposition: A | Discharge status: Home / Self Care | Payer: Medicaid Other | Source: Ambulatory Visit | Attending: Radiation Oncology | Admitting: Radiation Oncology

## 2020-05-14 ENCOUNTER — Other Ambulatory Visit: Payer: Self-pay

## 2020-05-14 ENCOUNTER — Inpatient Hospital Stay: Payer: Medicaid Other

## 2020-05-14 DIAGNOSIS — Z510 Encounter for antineoplastic radiation therapy: Secondary | ICD-10-CM | POA: Diagnosis not present

## 2020-05-14 DIAGNOSIS — D500 Iron deficiency anemia secondary to blood loss (chronic): Secondary | ICD-10-CM

## 2020-05-14 DIAGNOSIS — C220 Liver cell carcinoma: Secondary | ICD-10-CM

## 2020-05-14 DIAGNOSIS — D509 Iron deficiency anemia, unspecified: Secondary | ICD-10-CM | POA: Diagnosis not present

## 2020-05-14 LAB — CBC WITH DIFFERENTIAL (CANCER CENTER ONLY)
Abs Immature Granulocytes: 0.02 10*3/uL (ref 0.00–0.07)
Basophils Absolute: 0 10*3/uL (ref 0.0–0.1)
Basophils Relative: 1 %
Eosinophils Absolute: 0.1 10*3/uL (ref 0.0–0.5)
Eosinophils Relative: 5 %
HCT: 27.7 % — ABNORMAL LOW (ref 39.0–52.0)
Hemoglobin: 8.7 g/dL — ABNORMAL LOW (ref 13.0–17.0)
Immature Granulocytes: 1 %
Lymphocytes Relative: 13 %
Lymphs Abs: 0.4 10*3/uL — ABNORMAL LOW (ref 0.7–4.0)
MCH: 30.4 pg (ref 26.0–34.0)
MCHC: 31.4 g/dL (ref 30.0–36.0)
MCV: 96.9 fL (ref 80.0–100.0)
Monocytes Absolute: 0.2 10*3/uL (ref 0.1–1.0)
Monocytes Relative: 9 %
Neutro Abs: 1.9 10*3/uL (ref 1.7–7.7)
Neutrophils Relative %: 71 %
Platelet Count: 169 10*3/uL (ref 150–400)
RBC: 2.86 MIL/uL — ABNORMAL LOW (ref 4.22–5.81)
RDW: 23.4 % — ABNORMAL HIGH (ref 11.5–15.5)
WBC Count: 2.6 10*3/uL — ABNORMAL LOW (ref 4.0–10.5)
nRBC: 0 % (ref 0.0–0.2)

## 2020-05-14 LAB — CMP (CANCER CENTER ONLY)
ALT: 134 U/L — ABNORMAL HIGH (ref 0–44)
AST: 119 U/L — ABNORMAL HIGH (ref 15–41)
Albumin: 3.1 g/dL — ABNORMAL LOW (ref 3.5–5.0)
Alkaline Phosphatase: 192 U/L — ABNORMAL HIGH (ref 38–126)
Anion gap: 10 (ref 5–15)
BUN: 28 mg/dL — ABNORMAL HIGH (ref 8–23)
CO2: 24 mmol/L (ref 22–32)
Calcium: 9.3 mg/dL (ref 8.9–10.3)
Chloride: 102 mmol/L (ref 98–111)
Creatinine: 1.14 mg/dL (ref 0.61–1.24)
GFR, Est AFR Am: >60 mL/min (ref >60)
GFR, Estimated: >60 mL/min (ref >60)
Glucose, Bld: 100 mg/dL — ABNORMAL HIGH (ref 70–99)
Potassium: 4.2 mmol/L (ref 3.5–5.1)
Sodium: 136 mmol/L (ref 135–145)
Total Bilirubin: 0.5 mg/dL (ref 0.3–1.2)
Total Protein: 7.1 g/dL (ref 6.5–8.1)

## 2020-05-15 ENCOUNTER — Encounter: Payer: Self-pay | Admitting: Radiation Oncology

## 2020-05-15 ENCOUNTER — Ambulatory Visit
Admission: RE | Admit: 2020-05-15 | Discharge: 2020-05-15 | Disposition: A | Discharge status: Home / Self Care | Payer: Medicaid Other | Source: Ambulatory Visit | Attending: Radiation Oncology | Admitting: Radiation Oncology

## 2020-05-15 ENCOUNTER — Other Ambulatory Visit: Payer: Self-pay

## 2020-05-15 DIAGNOSIS — Z510 Encounter for antineoplastic radiation therapy: Secondary | ICD-10-CM | POA: Diagnosis not present

## 2020-05-15 NOTE — Telephone Encounter (Signed)
No return call received.   Called HR to confirm verify FMLA request, correct form with no response. Completed form provided on 05/13/2020 indicating FMLA is to care for parent.

## 2020-05-16 ENCOUNTER — Telehealth: Payer: Self-pay

## 2020-05-16 ENCOUNTER — Other Ambulatory Visit: Payer: Self-pay | Admitting: Hematology

## 2020-05-16 MED ORDER — OXYCODONE HCL 10 MG PO TABS: 10.0000 mg | ORAL_TABLET | ORAL | 0 refills | Status: DC | PRN

## 2020-05-16 MED ORDER — OXYCODONE HCL ER 30 MG PO T12A: 30.0000 mg | EXTENDED_RELEASE_TABLET | Freq: Two times a day (BID) | ORAL | 0 refills | Status: DC

## 2020-05-16 NOTE — Telephone Encounter (Signed)
I called pt and discussed his pain management.  He is currently on OxyContin 15 mg twice daily, and oxycodone 10 mg every 3-4 hours.  He just finished radiation yesterday.  I recommend increasing OxyContin to 30 mg twice daily, I called in a new prescription today.  I also reviewed oxycodone for him.  I discussed that his pain is likely going to improve from radiation in the next few weeks.  Patient voiced good understanding, and agrees with the plan.  Truitt Merle  05/16/2020

## 2020-05-16 NOTE — Telephone Encounter (Signed)
Vernon [name] left vm stating that his pain medication was not working.  I returned his call.  He states he is taking the Oxycontin 15mg  q 12 hours and the oxycodone 10 mg q 4 hours and his pain is still in the 5/10 range. He is having difficulty sleeping from the pain. He states it feels like a knot from his right hip to his knee. I explained that we would probably not be able to achieve a pain free status.  He verbalized understanding. He requests adjustment to his current regimen.

## 2020-05-17 ENCOUNTER — Other Ambulatory Visit: Payer: Self-pay | Admitting: Hematology

## 2020-05-17 DIAGNOSIS — C220 Liver cell carcinoma: Secondary | ICD-10-CM

## 2020-05-17 MED ORDER — OXYCODONE HCL ER 30 MG PO T12A: 30.0000 mg | EXTENDED_RELEASE_TABLET | Freq: Two times a day (BID) | ORAL | 0 refills | Status: DC

## 2020-05-20 ENCOUNTER — Encounter (HOSPITAL_COMMUNITY)
Admission: RE | Admit: 2020-05-20 | Discharge: 2020-05-20 | Disposition: A | Discharge status: Home / Self Care | Payer: Medicaid Other | Source: Ambulatory Visit | Attending: Nurse Practitioner | Admitting: Nurse Practitioner

## 2020-05-20 ENCOUNTER — Other Ambulatory Visit: Payer: Self-pay

## 2020-05-20 DIAGNOSIS — C220 Liver cell carcinoma: Secondary | ICD-10-CM | POA: Insufficient documentation

## 2020-05-20 MED ORDER — TECHNETIUM TC 99M MEDRONATE IV KIT: 20.0000 | PACK | Freq: Once | INTRAVENOUS | Status: DC | PRN

## 2020-05-21 ENCOUNTER — Telehealth: Payer: Self-pay | Admitting: Nurse Practitioner

## 2020-05-21 NOTE — Telephone Encounter (Signed)
Spoke with patient regarding the In-home Palliative referral and explained to him what Palliative services were and he stated that he was doing okay right now and has declined services.  I instructed him to call his MD if things should change, to let them know that he would like Palliative services in the home and he was in agreement with this.

## 2020-05-27 ENCOUNTER — Telehealth: Payer: Self-pay | Admitting: Pharmacy Technician

## 2020-05-27 ENCOUNTER — Other Ambulatory Visit: Payer: Self-pay | Admitting: Hematology

## 2020-05-27 ENCOUNTER — Telehealth: Payer: Self-pay | Admitting: Pharmacist

## 2020-05-27 DIAGNOSIS — C220 Liver cell carcinoma: Secondary | ICD-10-CM

## 2020-05-27 MED ORDER — LENVATINIB (12 MG DAILY DOSE) 3 X 4 MG PO CPPK: 12.0000 mg | ORAL_CAPSULE | Freq: Every day | ORAL | 1 refills | Status: DC

## 2020-05-27 MED FILL — LENVIMA 12 MG DAILY DOSE 4: 3 X 4 | 30 days supply | Qty: 90 | Fill #0

## 2020-05-27 NOTE — Telephone Encounter (Signed)
Oral Oncology Patient Advocate Encounter  I spoke with Mr Mccaskey this afternoon to set up delivery of Lenvima.  Address verified for shipment.  Michel Santee will be filled through [facility] and mailed 05/27/20 for delivery 05/28/20.    [place] will call 7-10 days before next refill is due to complete adherence call and set up delivery of medication.     [place] Patient [place] Phone [phone] Fax [phone] 05/27/2020 3:58 PM

## 2020-05-27 NOTE — Telephone Encounter (Signed)
Oral Oncology Patient Advocate Encounter  After completing a benefits investigation, prior authorization for Lenvima is not required at this time through [facility].  Patient's copay is $3.00.  [place] Patient [place] Phone [phone] Fax [phone] 05/27/2020 3:58 PM

## 2020-05-27 NOTE — Telephone Encounter (Signed)
Oral Chemotherapy Pharmacist Encounter  Patient Education I spoke with patient for overview of new oral chemotherapy medication: Lenvima (lenvatinib) for the treatment of metastatic [place], planned duration until disease progression or unacceptable drug toxicity.   Counseled patient on administration, dosing, side effects, monitoring, drug-food interactions, safe handling, storage, and disposal. Patient will take 12 mg by mouth daily. Start at 4mg  daily for first week, then increase to 8mg  daily for second week and 12mg  daily for third week if tolerates well.  Side effects include but not limited to: HTN, diarrhea, fatigue, hand-foot syndrome, N/V, edema.    Reviewed with patient importance of keeping a medication schedule and plan for any missed doses.  Vernon [name] voiced understanding and appreciation. All questions answered. Medication handout placed in the mail.  Provided patient with [facility] phone number. Patient knows to call the office with questions or concerns. Oral Chemotherapy Navigation Clinic will continue to follow.  Darl Pikes, PharmD, BCPS, BCOP, CPP Hematology/Oncology Clinical Pharmacist Practitioner [facility] [phone]  05/27/2020 4:33 PM

## 2020-05-28 ENCOUNTER — Telehealth: Payer: Self-pay | Admitting: *Deleted

## 2020-05-28 NOTE — Telephone Encounter (Signed)
FMLA request for Vernon [name] daughter Vernon [name] completed x 2.  Incorrect form for initial request.  Second form completed at initial registration desk for pick up noted in appointments for staff.

## 2020-05-28 NOTE — Progress Notes (Signed)
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    Hepatocellular carcinoma (HCC)  07/15/2018 Imaging   MRI Liver  IMPRESSION: Multiple hypervascular liver masses, highly suspicious for malignancy. Differential diagnosis includes multifocal hepatocellular carcinoma and metastatic disease. Consider ultrasound-guided percutaneous needle biopsy for tissue diagnosis.   No other abdominal soft tissue masses or lymphadenopathy.   07/29/2018 Initial Diagnosis   Hepatocellular carcinoma (HCC)   08/11/2018 PET scan   IMPRESSION: No hypermetabolic masses identified within the liver, or elsewhere within the neck, chest, abdomen, or pelvis. This makes hepatic metastases extremely unlikely, however multifocal hepatocellular carcinoma cannot be excluded. Recommend correlation with tumor markers, and consider ultrasound-guided needle biopsy for tissue diagnosis.   09/08/2018 Initial Biopsy   DIAGNOSIS:  A. LIVER MASS; ULTRASOUND-GUIDED BIOPSY:  - HEPATOCELLULAR CARCINOMA, MODERATELY DIFFERENTIATED (WHO 2019).  - BACKGROUND LIVER WITH FOCALLY INTENSE LYMPHOPLASMACYTIC INFILTRATE AND  AREAS WORRISOME FOR PARENCHYMAL NECROSIS, SEE COMMENT.   Comment:  The patient's history of hepatitis C is noted.  The background liver  demonstrates areas of intense lymphoplasmacytic inflammation with brisk  piecemeal necrosis.  Areas worrisome for parenchymal necrosis are  present.   These findings raise the possibility of an underlying and  concurrent autoimmune hepatitis.  Recommend autoimmune hepatitis  serologies (e.g. ANA).  These findings were discussed with Dr. Merlene Pulling  on 09/09/2018.    Bone metastasis (HCC)  04/24/2020 Progression   CT CAP IMPRESSION: 1. Significant progression of multifocal bilateral hepatocellular carcinoma. 2. Osseous metastasis, including a dominant right-sided L4 lesion. Consider further evaluation with lumbar and possibly thoracic spine MRI, given the size of the dominant right-sided L4 lesion. 3. Right-sided pulmonary embolism, without evidence of right heart strain. 4. No evidence of metastatic disease within the chest. 5. 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 6. Aortic atherosclerosis (ICD10-I70.0), coronary artery atherosclerosis and emphysema (ICD10-J43.9).   04/25/2020 Imaging   MRI Lumbar and Thoracic Spine  IMPRESSION: Thoracic spine:   1. Metastatic lesions involving the T12 vertebral body and T9 right posterior elements. 2. Mild right neuroforaminal stenosis at T9-T10 due to tumor extension.   Lumbar spine:   1. Large 5.0 x 6.0 cm metastatic lesion involving the right aspect of the L4 vertebral body with paravertebral and epidural tumor extension. 2. Partially visualized 4.0 x 2.9 cm metastatic lesion in the left sacral ala. 3. Relatively diffuse enhancement of the other lumbar vertebral bodies without focality, also concerning for metastatic disease. 4. Severe spinal canal stenosis at L3-L4 and L4-L5 due primarily to degenerative disc disease and prominent posterior epidural fat. 5. Moderate to severe right neuroforaminal stenosis at L4-L5 due to tumor extension.   04/30/2020 Initial Diagnosis   Bone metastasis (HCC)   05/02/2020 - 05/15/2020 Radiation Therapy   palliative radiation with Dr Mitzi Hansen to his L4 lesion and T9-10 lesions 05/02/20-05/15/20.    05/20/2020 Imaging   Whole  body Bone Scan  IMPRESSION: Increased activity noted over T9 and T12 vertebral bodies consistent prior findings suggesting metastatic disease.  No other definite abnormalities identified. Bony disease is more evident on prior MRI and CT studies.   05/31/2020 -  Chemotherapy   Lenvatinib start at '4mg'$  daily and increase to '12mg'$  daily over 2 weeks starting 05/31/20.        CURRENT THERAPY:  Lenvatinib start at '4mg'$  daily and increase to '12mg'$  daily over 2 weeks starting 05/31/20.   INTERVAL HISTORY:  Vernon [name] is here for a follow up. He presents to the clinic with his family member. He notes his pain since RT has only mildly improved. He was able to walk into clinic. He is on OxyContin '30mg'$  q12hours which was recently increased 2 weeks ago. He is also on Oxycodone '10mg'$ . He denies any other new pain. He notes he plans to start Lenvatinib today when he gets back home. He received the medication 2 days ago. He notes he is trying to regain appetite. He is interested in trying medication for this.     REVIEW OF SYSTEMS:   Constitutional: Denies fevers, chills or abnormal weight loss Eyes: Denies blurriness of vision Ears, nose, mouth, throat, and face: Denies mucositis or sore throat Respiratory: Denies cough, dyspnea or wheezes Cardiovascular: Denies palpitation, chest discomfort or lower extremity swelling Gastrointestinal:  Denies nausea, heartburn or change in bowel habits Skin: Denies abnormal skin rashes MSK: (+) right hip pain, mildly improved Lymphatics: Denies new lymphadenopathy or easy bruising Neurological:Denies numbness, tingling or new weaknesses Behavioral/Psych: Mood is stable, no new changes  All other systems were reviewed with the patient and are negative.  MEDICAL HISTORY:  Past Medical History:  Diagnosis Date  . Gout   . Hepatitis C   . Hepatocellular carcinoma ([place])     SURGICAL HISTORY: History reviewed. No pertinent surgical history.  I have reviewed the  social history and family history with the patient and they are unchanged from previous note.  ALLERGIES:  has No Known Allergies.  MEDICATIONS:  Current Outpatient Medications  Medication Sig Dispense Refill  . gabapentin (NEURONTIN) 300 MG capsule Take 1 capsule (300 mg total) by mouth 3 (three) times daily. 90 capsule 1  . Lenvatinib 12 mg daily dose (LENVIMA) 3 x 4 MG capsule Take 12 mg by mouth daily. Start at '4mg'$  daily for first week, then increase to '8mg'$  daily for second week and '12mg'$  daily for third week if tolerates well 90 capsule 1  . mirtazapine (REMERON) 7.5 MG tablet Take 1 tablet (7.5 mg total) by mouth at bedtime. 30 tablet 1  . oxyCODONE (OXYCONTIN) 30 MG 12 hr tablet Take 1 tablet (30 mg total) by mouth every 12 (twelve) hours. 30 tablet 0  . Oxycodone HCl 10 MG TABS Take 1 tablet (10 mg total) by mouth every 6 (six) hours as needed. OK to refill on 05/18/2020 90 tablet 0  . polyethylene glycol (MIRALAX / GLYCOLAX) 17 g packet Take 17 g by mouth daily as needed for mild constipation. 14 each 0   No current facility-administered medications for this visit.    PHYSICAL EXAMINATION: ECOG PERFORMANCE STATUS: 2 - Symptomatic, <50% confined to bed  Vitals:   05/31/20 0945  BP: 131/68  Pulse: 94  Resp: 18  Temp: 97.7 F (36.5 C)  SpO2: 96%   Filed Weights   05/31/20 0945  Weight: 161 lb 9.6 oz (73.3 kg)    GENERAL:alert, no distress and comfortable SKIN: skin color, texture, turgor are normal, no rashes or significant lesions EYES: normal, Conjunctiva are pink and non-injected, sclera clear  NECK: supple, thyroid normal size, non-tender, without nodularity LYMPH:  no palpable lymphadenopathy in the cervical, axillary  LUNGS: clear to auscultation and percussion with normal breathing effort HEART: regular rate & rhythm and no murmurs and no lower extremity edema ABDOMEN:abdomen soft, non-tender and normal bowel sounds (+) Right hip tenderness Musculoskeletal:no  cyanosis of digits and no clubbing  NEURO: alert & oriented x 3 with fluent speech, no focal motor/sensory deficits  LABORATORY DATA:  I have reviewed the data as listed CBC Latest Ref Rng & Units 05/31/2020 05/14/2020 05/03/2020  WBC 4.0 - 10.5 K/uL 4.5 2.6(L) 7.5  Hemoglobin 13.0 - 17.0 g/dL 8.9(L) 8.7(L) 8.8(L)  Hematocrit 39 - 52 % 28.7(L) 27.7(L) 28.6(L)  Platelets 150 - 400 K/uL 257 169 285     CMP Latest Ref Rng & Units 05/31/2020 05/14/2020 05/03/2020  Glucose 70 - 99 mg/dL 115(H) 100(H) 110(H)  BUN 8 - 23 mg/dL 11 28(H) 21  Creatinine 0.61 - 1.24 mg/dL 1.12 1.14 1.05  Sodium 135 - 145 mmol/L 143 136 139  Potassium 3.5 - 5.1 mmol/L 4.2 4.2 4.2  Chloride 98 - 111 mmol/L 108 102 103  CO2 22 - 32 mmol/L '23 24 23  '$ Calcium 8.9 - 10.3 mg/dL 9.5 9.3 9.4  Total Protein 6.5 - 8.1 g/dL 7.7 7.1 7.4  Total Bilirubin 0.3 - 1.2 mg/dL 0.6 0.5 0.4  Alkaline Phos 38 - 126 U/L 228(H) 192(H) 225(H)  AST 15 - 41 U/L 126(H) 119(H) 93(H)  ALT 0 - 44 U/L 88(H) 134(H) 113(H)      RADIOGRAPHIC STUDIES: I have personally reviewed the radiological images as listed and agreed with the findings in the report. No results found.   ASSESSMENT & PLAN:  Vernon [name] is a 59 y.o. male with   1. Multifocal HCC, Metastatic to bone, stage IV  -He was initially diagnosed in 08/2018 then lost follow up after 09/14/18.  Presented to hospital with significant left hip pain from L4 bone metastasis,  at this visit, his biopsy, lab, and imaging results were discussed with him.Systemic treatment was discussed (sorafenib versus lenvatinib).  -Due to worsening right hip pain he had work up done by his PCP. His 04/24/20 CT scan results show significant progression of multifocal bilateral hepatocellular carcinoma, osseous metastasis including a dominant right-sided L4 lesion, right-sided PE without evidence of heart strain, no evidence of metastatic disease in the chest, right portal and hepatic vein occlusion presumably by  tumor thrombus.  -MRI thoracic and lumbar spine from 04/25/20 showed multiple bone mets. His bone scan from 05/20/20 shows increased activity noted over T9 and T12.  -He underwent palliative radiation to his L4 lesion and T9-10 lesions 05/02/20-05/15/20.  -His right hip pain has only mildly improved. I discussed his RT is still taking effect. I reviewed pain management with him. Labs reviewed, Hg 8.9, BG 115, albumin 2.9, elevated LFTs stable.  -He will proceed with systemic treatment to control his disease and prolong his life. He will start Lenvatinib at '4mg'$  daily today for 1 week, increase to '8mg'$  the second week and increase to '12mg'$  the third week and continue daily. I reviewed side effects with him. He knows to call if he has concerns -F/u in 3 weeks  2. Right hip pain secondary to L4 bone met  -For the past several months he has been experiencing right hip pain which has gradually worsened. Pain has been severe, throbbing in quality, and radiating down the right thigh. -He underwent palliative radiation to  his L4 lesion and T9-10 lesions 05/02/20-05/15/20. His pain has only mildly improved so far. I discussed the RT is still taking effect.  -He is currently on OxyContin '30mg'$  BID and Oxycodone '10mg'$  4-5 times a day. This is what is controlling his pain currently. I discussed his is already on high dose he should not further increase. He is agreeable.  -I will call in Gabapentin '300mg'$  to help his nerve pain as well (05/31/20). He can start at night and if tolerable her can take 1 tab in the AM or up to TID. I reviewed side effect of drowsiness.   3. Low Appetite  -He has lowered appetite and weight loss in the past month. Since completing RT he has been trying to increase appetite.  -I will call in Mirtazapine today (05/31/20). If not enough he can try Marinol. He is agreeable.   4. history of PE  -Seen on 04/24/20 CT Chest, incidentally.  -Continue Eliquis 5 mg twice daily -We discussed the risk of GI  bleeding, especially in the setting of liver cirrhosis  5. 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  -I referred him to GI for EGD after discharge.  -I encouraged him to take supplement to help his anemia.   6. Hepatitis C and liver cirrhosis  -He was referred to liver clinic formanagement of his hepatitis C. He has never received treatment for hepatitis C. He has never had a prior EGD.  -Continue follow-up with liver clinic NP Dawn  -Will refer to [facility] to establish care and EGD.   7. Social support  -The patient is single but lives with his girlfriend. He has 3 children. -His daughter will be his main caregiver, will fill out her FMLA paperwork as needed.  -I previously referred him to SW   PLAN:  -I called in Gabapentin '300mg'$  and Mirtazapine today  -He start Lenvatinib at '4mg'$  daily today for 1 week, increase to '8mg'$  the second week and increase to '12mg'$  the third week and continue daily. -Lab and f/u in 3 weeks  -I will check with Heide Spark about his GI referral  -will discuss Zometa on next visit    No problem-specific Assessment & Plan notes found for this encounter.   No orders of the defined types were placed in this encounter.  All questions were answered. The patient knows to call the clinic with any problems, questions or concerns. No barriers to learning was detected. The total time spent in the appointment was 30 minutes.     Truitt Merle, MD 05/31/2020   I, Joslyn Devon, am acting as scribe for Truitt Merle, MD.   I have reviewed the above documentation for accuracy and completeness, and I agree with the above.

## 2020-05-30 ENCOUNTER — Other Ambulatory Visit: Payer: Self-pay | Admitting: Nurse Practitioner

## 2020-05-30 DIAGNOSIS — M109 Gout, unspecified: Secondary | ICD-10-CM

## 2020-05-31 ENCOUNTER — Telehealth: Payer: Self-pay | Admitting: Hematology

## 2020-05-31 ENCOUNTER — Inpatient Hospital Stay: Payer: Medicaid Other

## 2020-05-31 ENCOUNTER — Encounter: Payer: Self-pay | Admitting: Hematology

## 2020-05-31 ENCOUNTER — Other Ambulatory Visit: Payer: Self-pay

## 2020-05-31 ENCOUNTER — Inpatient Hospital Stay: Payer: Medicaid Other | Attending: Hematology | Admitting: Hematology

## 2020-05-31 VITALS — BP 131/68 | HR 94 | Temp 97.7°F | Resp 18 | Ht 66.0 in | Wt 161.6 lb

## 2020-05-31 DIAGNOSIS — Z923 Personal history of irradiation: Secondary | ICD-10-CM | POA: Diagnosis not present

## 2020-05-31 DIAGNOSIS — Z79899 Other long term (current) drug therapy: Secondary | ICD-10-CM | POA: Insufficient documentation

## 2020-05-31 DIAGNOSIS — K7460 Unspecified cirrhosis of liver: Secondary | ICD-10-CM | POA: Insufficient documentation

## 2020-05-31 DIAGNOSIS — Z86711 Personal history of pulmonary embolism: Secondary | ICD-10-CM | POA: Diagnosis not present

## 2020-05-31 DIAGNOSIS — D630 Anemia in neoplastic disease: Secondary | ICD-10-CM | POA: Diagnosis not present

## 2020-05-31 DIAGNOSIS — G893 Neoplasm related pain (acute) (chronic): Secondary | ICD-10-CM | POA: Diagnosis not present

## 2020-05-31 DIAGNOSIS — Z7901 Long term (current) use of anticoagulants: Secondary | ICD-10-CM | POA: Insufficient documentation

## 2020-05-31 DIAGNOSIS — D509 Iron deficiency anemia, unspecified: Secondary | ICD-10-CM | POA: Diagnosis not present

## 2020-05-31 DIAGNOSIS — R630 Anorexia: Secondary | ICD-10-CM | POA: Insufficient documentation

## 2020-05-31 DIAGNOSIS — I2699 Other pulmonary embolism without acute cor pulmonale: Secondary | ICD-10-CM

## 2020-05-31 DIAGNOSIS — C7951 Secondary malignant neoplasm of bone: Secondary | ICD-10-CM | POA: Insufficient documentation

## 2020-05-31 DIAGNOSIS — C220 Liver cell carcinoma: Secondary | ICD-10-CM

## 2020-05-31 DIAGNOSIS — B1920 Unspecified viral hepatitis C without hepatic coma: Secondary | ICD-10-CM

## 2020-05-31 DIAGNOSIS — R110 Nausea: Secondary | ICD-10-CM | POA: Diagnosis not present

## 2020-05-31 DIAGNOSIS — D500 Iron deficiency anemia secondary to blood loss (chronic): Secondary | ICD-10-CM

## 2020-05-31 LAB — CMP (CANCER CENTER ONLY)
ALT: 88 U/L — ABNORMAL HIGH (ref 0–44)
AST: 126 U/L — ABNORMAL HIGH (ref 15–41)
Albumin: 2.9 g/dL — ABNORMAL LOW (ref 3.5–5.0)
Alkaline Phosphatase: 228 U/L — ABNORMAL HIGH (ref 38–126)
Anion gap: 12 (ref 5–15)
BUN: 11 mg/dL (ref 8–23)
CO2: 23 mmol/L (ref 22–32)
Calcium: 9.5 mg/dL (ref 8.9–10.3)
Chloride: 108 mmol/L (ref 98–111)
Creatinine: 1.12 mg/dL (ref 0.61–1.24)
GFR, Est AFR Am: >60 mL/min (ref >60)
GFR, Estimated: >60 mL/min (ref >60)
Glucose, Bld: 115 mg/dL — ABNORMAL HIGH (ref 70–99)
Potassium: 4.2 mmol/L (ref 3.5–5.1)
Sodium: 143 mmol/L (ref 135–145)
Total Bilirubin: 0.6 mg/dL (ref 0.3–1.2)
Total Protein: 7.7 g/dL (ref 6.5–8.1)

## 2020-05-31 LAB — FERRITIN: Ferritin: 1643 ng/mL — ABNORMAL HIGH (ref 24–336)

## 2020-05-31 LAB — CBC WITH DIFFERENTIAL (CANCER CENTER ONLY)
Abs Immature Granulocytes: 0.01 10*3/uL (ref 0.00–0.07)
Basophils Absolute: 0 10*3/uL (ref 0.0–0.1)
Basophils Relative: 1 %
Eosinophils Absolute: 0.1 10*3/uL (ref 0.0–0.5)
Eosinophils Relative: 3 %
HCT: 28.7 % — ABNORMAL LOW (ref 39.0–52.0)
Hemoglobin: 8.9 g/dL — ABNORMAL LOW (ref 13.0–17.0)
Immature Granulocytes: 0 %
Lymphocytes Relative: 36 %
Lymphs Abs: 1.6 10*3/uL (ref 0.7–4.0)
MCH: 29.9 pg (ref 26.0–34.0)
MCHC: 31 g/dL (ref 30.0–36.0)
MCV: 96.3 fL (ref 80.0–100.0)
Monocytes Absolute: 0.4 10*3/uL (ref 0.1–1.0)
Monocytes Relative: 8 %
Neutro Abs: 2.3 10*3/uL (ref 1.7–7.7)
Neutrophils Relative %: 52 %
Platelet Count: 257 10*3/uL (ref 150–400)
RBC: 2.98 MIL/uL — ABNORMAL LOW (ref 4.22–5.81)
RDW: 23.2 % — ABNORMAL HIGH (ref 11.5–15.5)
WBC Count: 4.5 10*3/uL (ref 4.0–10.5)
nRBC: 0 % (ref 0.0–0.2)

## 2020-05-31 LAB — IRON AND TIBC
Iron: 65 ug/dL (ref 42–163)
Saturation Ratios: 17 % — ABNORMAL LOW (ref 20–55)
TIBC: 381 ug/dL (ref 202–409)
UIBC: 316 ug/dL (ref 117–376)

## 2020-05-31 MED ORDER — OXYCODONE HCL 10 MG PO TABS: 10.0000 mg | ORAL_TABLET | Freq: Four times a day (QID) | ORAL | 0 refills | Status: DC | PRN

## 2020-05-31 MED ORDER — MIRTAZAPINE 7.5 MG PO TABS
7.5000 mg | ORAL_TABLET | Freq: Every day | ORAL | 1 refills | Status: DC
Start: 2020-05-31 — End: 2020-06-21

## 2020-05-31 MED ORDER — GABAPENTIN 300 MG PO CAPS
300.0000 mg | ORAL_CAPSULE | Freq: Three times a day (TID) | ORAL | 1 refills | Status: AC
Start: 2020-05-31 — End: ?

## 2020-05-31 NOTE — Telephone Encounter (Signed)
Scheduled per 07/02 los, patient has received updated calender and after visit summary.

## 2020-06-01 LAB — AFP TUMOR MARKER: AFP, Serum, Tumor Marker: 215 ng/mL — ABNORMAL HIGH (ref 0.0–8.3)

## 2020-06-06 ENCOUNTER — Telehealth: Payer: Self-pay

## 2020-06-06 ENCOUNTER — Other Ambulatory Visit: Payer: Self-pay | Admitting: Hematology

## 2020-06-06 DIAGNOSIS — C220 Liver cell carcinoma: Secondary | ICD-10-CM

## 2020-06-06 MED ORDER — OXYCODONE HCL ER 30 MG PO T12A: 30.0000 mg | EXTENDED_RELEASE_TABLET | Freq: Two times a day (BID) | ORAL | 0 refills | Status: DC

## 2020-06-06 NOTE — Telephone Encounter (Signed)
Mr Nofziger called requesting refill for his oxycontin. Forwarded request to Dr. Burr Medico.

## 2020-06-07 ENCOUNTER — Telehealth: Payer: Self-pay

## 2020-06-07 ENCOUNTER — Other Ambulatory Visit: Payer: Self-pay | Admitting: Hematology

## 2020-06-07 MED ORDER — MORPHINE SULFATE ER 30 MG PO TBCR: 30.0000 mg | EXTENDED_RELEASE_TABLET | Freq: Two times a day (BID) | ORAL | 0 refills | Status: DC

## 2020-06-07 NOTE — Telephone Encounter (Signed)
Vernon [name] called stating [facility] could not fill his pain medication.  I called [facility] [address] and was told a PA is needed for the Oxycontin.  I have submitted request for PA via covermymeds.  Vernon [name] has been notified.  I instructed him to [facility] tomorrow to see if the oxycotin has been authorized.  He verbalized understanding

## 2020-06-13 ENCOUNTER — Telehealth: Payer: Self-pay

## 2020-06-13 ENCOUNTER — Other Ambulatory Visit: Payer: Self-pay | Admitting: Hematology

## 2020-06-13 MED ORDER — OXYCODONE HCL 10 MG PO TABS: 10.0000 mg | ORAL_TABLET | ORAL | 0 refills | Status: DC | PRN

## 2020-06-13 NOTE — Telephone Encounter (Signed)
Mr Suarez is requesting a refill for oxycodone.  He was taking every 4 hours while awaiting MS contin prescription.

## 2020-06-13 NOTE — Telephone Encounter (Signed)
I spoke with Vernon [name] regarding the MS Contin medication for his pain.  I explained that medicaid would no longer pay for the oxycontin and we had to switch to long acting morphine.  He stated that during the time awaiting authorization determination from medicaid he ran out of oxycontin and had to take more of the oxycodone.  He states he was taking it every 4 hours.  He now needs a refill.  I will forward this request to Dr. Burr Medico.

## 2020-06-17 NOTE — Progress Notes (Signed)
Vernon [name]   [phone] [phone]) [phone]   Clinic Follow up Note   Patient Care Team: Mikey College, NP (Inactive) as PCP - General (Nurse Practitioner) Clent Jacks, RN as Registered Nurse Truitt Merle, MD as Consulting Physician (Hematology) Jonnie Finner, RN as Oncology Nurse Navigator Roosevelt Locks, [place] as Nurse Practitioner (Nurse Practitioner)  Date of Service:  06/21/2020  CHIEF COMPLAINT: F/u of PE and Multifocal [place] metastatic to bone  SUMMARY OF ONCOLOGIC HISTORY: Oncology History Overview Note  Cancer Staging No matching staging information was found for the patient.    Hepatocellular carcinoma ([place])  07/15/2018 Imaging   MRI Liver  IMPRESSION: Multiple hypervascular liver masses, highly suspicious for malignancy. Differential diagnosis includes multifocal hepatocellular carcinoma and metastatic disease. Consider ultrasound-guided percutaneous needle biopsy for tissue diagnosis.   No other abdominal soft tissue masses or lymphadenopathy.   07/29/2018 Initial Diagnosis   Hepatocellular carcinoma ([place])   08/11/2018 PET scan   IMPRESSION: No hypermetabolic masses identified within the liver, or elsewhere within the neck, chest, abdomen, or pelvis. This makes hepatic metastases extremely unlikely, however multifocal hepatocellular carcinoma cannot be excluded. Recommend correlation with tumor markers, and consider ultrasound-guided needle biopsy for tissue diagnosis.   09/08/2018 Initial Biopsy   DIAGNOSIS:  A. LIVER MASS; ULTRASOUND-GUIDED BIOPSY:  - HEPATOCELLULAR CARCINOMA, MODERATELY DIFFERENTIATED (WHO 2019).  - BACKGROUND LIVER WITH FOCALLY INTENSE LYMPHOPLASMACYTIC INFILTRATE AND  AREAS WORRISOME FOR PARENCHYMAL NECROSIS, SEE COMMENT.   Comment:  The patient's history of hepatitis C is noted.  The background liver  demonstrates areas of intense lymphoplasmacytic inflammation with brisk  piecemeal  necrosis.  Areas worrisome for parenchymal necrosis are  present.  These findings raise the possibility of an underlying and  concurrent autoimmune hepatitis.  Recommend autoimmune hepatitis  serologies (e.g. ANA).  These findings were discussed with Dr. Mike Gip  on 09/09/2018.    Bone metastasis ([place])  04/24/2020 Progression   CT CAP IMPRESSION: 1. Significant progression of multifocal bilateral hepatocellular carcinoma. 2. Osseous metastasis, including a dominant right-sided L4 lesion. Consider further evaluation with lumbar and possibly thoracic spine MRI, given the size of the dominant right-sided L4 lesion. 3. Right-sided pulmonary embolism, without evidence of right heart strain. 4. No evidence of metastatic disease within the chest. 5. 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 6. Aortic atherosclerosis (ICD10-I70.0), coronary artery atherosclerosis and emphysema (ICD10-J43.9).   04/25/2020 Imaging   MRI Lumbar and Thoracic Spine  IMPRESSION: Thoracic spine:   1. Metastatic lesions involving the T12 vertebral body and T9 right posterior elements. 2. Mild right neuroforaminal stenosis at T9-T10 due to tumor extension.   Lumbar spine:   1. Large 5.0 x 6.0 cm metastatic lesion involving the right aspect of the L4 vertebral body with paravertebral and epidural tumor extension. 2. Partially visualized 4.0 x 2.9 cm metastatic lesion in the left sacral ala. 3. Relatively diffuse enhancement of the other lumbar vertebral bodies without focality, also concerning for metastatic disease. 4. Severe spinal canal stenosis at L3-L4 and L4-L5 due primarily to degenerative disc disease and prominent posterior epidural fat. 5. Moderate to severe right neuroforaminal stenosis at L4-L5 due to tumor extension.   04/30/2020 Initial Diagnosis   Bone metastasis ([place])   05/02/2020 - 05/15/2020 Radiation Therapy   palliative radiation with Dr Lisbeth Renshaw to his L4  lesion and T9-10 lesions 05/02/20-05/15/20.    05/20/2020 Imaging   Whole body Bone Scan  IMPRESSION: Increased activity noted over T9 and T12 vertebral  bodies consistent prior findings suggesting metastatic disease. No other definite abnormalities identified. Bony disease is more evident on prior MRI and CT studies.   05/31/2020 -  Chemotherapy   Lenvatinib start at '4mg'$  daily and increase to '12mg'$  daily over 2 weeks starting 05/31/20.        CURRENT THERAPY:  Lenvatinibstart at '4mg'$  daily and increase to '12mg'$  daily over 2 weeks starting 05/31/20. He remains on '8mg'$  on 06/21/20 due to nausea.   INTERVAL HISTORY:  Vernon [name] is here for a follow up of treatment. He presents to the clinic with family member. He notes he tried Morphine BID for 1 week but he could not tolerate it due N&V. He stopped taking it since and N&V stopped. For pain [facility] but did not have it in stock. He would like to move his prescription moved to [facility] or [facility]. He had been taking oxycodone '10mg'$  q4hours. He may take in the middle of the night if he wakes up. He notes Mirtazapine is not helping his appetite. He notes his appetite was good on Marijuana before. He notes he drinks 2-3 Ensure a day and have 1-2 meals a day.  He notes his appetite has decreased along with nausea on Lenvatinib '8mg'$ . He has not been able to increase to '12mg'$ . He has not tried antiemetics. He notes having dark stool. He notes once a week he may have flare of right hip pain and leg pain leading to decreased ambulation. He notes he is not doing much at home, but walks with cane or independently around sometimes.     REVIEW OF SYSTEMS:   Constitutional: Denies fevers, chills (+) weight loss, Low appetite  Eyes: Denies blurriness of vision Ears, nose, mouth, throat, and face: Denies mucositis or sore throat Respiratory: Denies cough, dyspnea or wheezes Cardiovascular: Denies palpitation, chest  discomfort or lower extremity swelling Gastrointestinal:  Denies heartburn or change in bowel habits (+) Nausea Skin: Denies abnormal skin rashes MSK: (+) right hip and leg pain  Lymphatics: Denies new lymphadenopathy or easy bruising Neurological:Denies numbness, tingling or new weaknesses Behavioral/Psych: Mood is stable, no new changes  All other systems were reviewed with the patient and are negative.  MEDICAL HISTORY:  Past Medical History:  Diagnosis Date  . Gout   . Hepatitis C   . Hepatocellular carcinoma ([place])     SURGICAL HISTORY: History reviewed. No pertinent surgical history.  I have reviewed the social history and family history with the patient and they are unchanged from previous note.  ALLERGIES:  has No Known Allergies.  MEDICATIONS:  Current Outpatient Medications  Medication Sig Dispense Refill  . albuterol (VENTOLIN HFA) 108 (90 Base) MCG/ACT inhaler Take 90 mcg by mouth every 4 (four) hours as needed.    Marland Kitchen apixaban (ELIQUIS) 5 MG TABS tablet Take 5 mg by mouth in the morning and at bedtime.    . gabapentin (NEURONTIN) 300 MG capsule Take 1 capsule (300 mg total) by mouth 3 (three) times daily. 90 capsule 1  . Oxycodone HCl 10 MG TABS Take 1 tablet (10 mg total) by mouth every 4 (four) hours as needed. OK to refill on 05/18/2020 90 tablet 0  . polyethylene glycol (MIRALAX / GLYCOLAX) 17 g packet Take 17 g by mouth daily as needed for mild constipation. 14 each 0  . polyethylene glycol powder (GLYCOLAX/MIRALAX) 17 GM/SCOOP powder SMARTSIG:1 Capful(s) By Mouth Daily PRN    . dronabinol (MARINOL)  2.5 MG capsule Take 1 capsule (2.5 mg total) by mouth 2 (two) times daily before a meal. 60 capsule 0  . Lenvatinib 12 mg daily dose (LENVIMA) 3 x 4 MG capsule Take 12 mg by mouth daily. Start at '4mg'$  daily for first week, then increase to '8mg'$  daily for second week and '12mg'$  daily for third week if tolerates well (Patient not taking: Reported on 06/21/2020) 90 capsule 1  .  oxyCODONE ER (XTAMPZA ER) 13.5 MG C12A Take 13.5 mg by mouth every 12 (twelve) hours. 30 capsule 0  . prochlorperazine (COMPAZINE) 10 MG tablet Take 1 tablet (10 mg total) by mouth every 6 (six) hours as needed for nausea or vomiting. 30 tablet 1   No current facility-administered medications for this visit.    PHYSICAL EXAMINATION: ECOG PERFORMANCE STATUS: 2 - Symptomatic, <50% confined to bed  Vitals:   06/21/20 0859  BP: (!) 128/101  Pulse: 93  Resp: 18  Temp: (!) 97.5 F (36.4 C)  SpO2: 99%   Filed Weights   06/21/20 0859  Weight: 151 lb 14.4 oz (68.9 kg)    Due to COVID19 we will limit examination to appearance. Patient had no complaints.  GENERAL:alert, no distress and comfortable SKIN: skin color normal, no rashes or significant lesions EYES: normal, Conjunctiva are pink and non-injected, sclera clear  NEURO: alert & oriented x 3 with fluent speech   LABORATORY DATA:  I have reviewed the data as listed CBC Latest Ref Rng & Units 06/21/2020 05/31/2020 05/14/2020  WBC 4.0 - 10.5 K/uL 5.4 4.5 2.6(L)  Hemoglobin 13.0 - 17.0 g/dL 11.0(L) 8.9(L) 8.7(L)  Hematocrit 39 - 52 % 34.4(L) 28.7(L) 27.7(L)  Platelets 150 - 400 K/uL 332 257 169     CMP Latest Ref Rng & Units 06/21/2020 05/31/2020 05/14/2020  Glucose 70 - 99 mg/dL 105(H) 115(H) 100(H)  BUN 8 - 23 mg/dL 25(H) 11 28(H)  Creatinine 0.61 - 1.24 mg/dL 1.23 1.12 1.14  Sodium 135 - 145 mmol/L 143 143 136  Potassium 3.5 - 5.1 mmol/L 4.1 4.2 4.2  Chloride 98 - 111 mmol/L 109 108 102  CO2 22 - 32 mmol/L '22 23 24  '$ Calcium 8.9 - 10.3 mg/dL 9.9 9.5 9.3  Total Protein 6.5 - 8.1 g/dL 8.2(H) 7.7 7.1  Total Bilirubin 0.3 - 1.2 mg/dL 0.9 0.6 0.5  Alkaline Phos 38 - 126 U/L 221(H) 228(H) 192(H)  AST 15 - 41 U/L 168(H) 126(H) 119(H)  ALT 0 - 44 U/L 117(H) 88(H) 134(H)      RADIOGRAPHIC STUDIES: I have personally reviewed the radiological images as listed and agreed with the findings in the report. No results found.    ASSESSMENT & PLAN:  Vernon [name] is a 59 y.o. male with    1. Multifocal HCC, Metastatic to bone, stage IV -He was initially diagnosed in 08/2018 then lost follow up after 09/14/18.Presented to hospital with significant left hip pain from L4 bone metastasis,at this visit, his biopsy, lab, and imaging results were discussed with him.Systemic treatment was discussed (sorafenib versus lenvatinib). -Due to worsening right hip pain he had work up done by his PCP. His 5/26/21CT scan results show significant progression of multifocal bilateral hepatocellular carcinoma, osseous metastasis including a dominant right-sided L4 lesion, right-sided PE without evidence of heart strain, no evidence of metastatic disease in the chest, right portal and hepatic vein occlusion presumably by tumor thrombus. -MRI thoracic and lumbar spine from 04/25/20 showedmultiple bone mets. His bone scan from 05/20/20 shows increased  activity noted over T9 and T12.  -He underwent palliative radiation to his L4 lesionand T9-10 lesions6/3/21-6/16/21.  -To control his disease and prolong his life he started systemic therapy with Lenvatinib on 05/31/20. He started with titrating dose at '4mg'$  daily today for first week, increase to '8mg'$  the second week and plan to increase to '12mg'$  the third week if he tolerates  -He was not able to increase to '12mg'$  due to nausea. He remains at '8mg'$ . Will manage on antiemetics to see if he can increase '12mg'$ . He is willing to try.  -Labs reviewed, Hg 11, BG 105, protein 8.2, albumin 2.9, AST 168, ALT 117, Alk Phos 221. Overall adequate to continue Lenvatinib '8mg'$ .  -I discussed if he is not able to tolerate on lower dose I would recommend returning to IV treatment with atezolizumab and bevacizumab which he will probably tolerate better.  -F/u in 2 weeks with Phone visit.    2.Right hip painsecondary to L4 bone met -For the past several months he has been experiencing right hip pain which has  gradually worsened. Pain has been severe, throbbing in quality, and radiating down the right thigh. -He underwent palliative radiation to his L4 lesionand T9-10 lesions6/3/21-6/16/21. His pain has only mildly improved so far.  -His insurance previously did not approve more Oxycodone so he tried Morphine. Due to N&V on Morphine, he stopped.  -If his insurance approves we will return to Oxycodone '10mg'$  which he has been taking q4hours. I discussed given frequent use, I can call in long acting Xtampza 13.'5mg'$  BID (7/213/21). He is agreeable.  -I reviewed narcotics use and management as these medications are highly regulated and can lead to dependence. He voiced good understanding.  -He is also taking Gabapentin '300mg'$ .  -I will discussed role of Zometa infusions at next visit.   3. Low Appetite, Nausea  -He has lowered appetite and weight loss in the past month. Since completing RT he has been trying to increase appetite. Weight has been fluctuating.  -Given Mirtazapine has not helped, I will call in low dose Marinol given he has had good appetite with prior Marijuana use. He is agreeable.  -He has experienced nausea mostly on Morphine which he has stopped and also on Lenvatinib '8mg'$ . I called in Compazine today (06/21/20).   4.History of PE  -Seen on 04/24/20 CT Chest, incidentally. 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 -I encouraged him to take supplement to help his anemia.  -Hg improved to 11 today (06/21/20)  6.Hepatitis C and liver cirrhosis -He was referred to liver clinic formanagement of his hepatitis C. He has never received treatment for hepatitis C.He has never had a prior EGD. -Continue follow-up with liver clinic NP  Dawn -I previously referred him to [facility] establish care and EGD.  7. Social support  -The patient is single but lives with his girlfriend. He has 3 children. -His daughter will be his main caregiver,will fill out her FMLA paperwork as needed.  -I previously referred him to [facility]   PLAN: -I called in Marinol, Oxycodone, long acting Xtampza, Compazine at [facility]. I [facility] at [facility] as [facility] did not have it in stock.  -Continue Lenvatinib '8mg'$  with antiemetics, and he will try to increase to  12 mg daily if he can tolerate -Phone call in 2 weeks    No problem-specific Assessment & Plan notes found for this encounter.   No orders of the defined types were placed in this encounter.  All questions were answered. The patient knows to call the clinic with any problems, questions or concerns. No barriers to learning was detected. The total time spent in the appointment was 30 minutes.     Truitt Merle, MD 06/21/2020   I, Joslyn Devon, am acting as scribe for Truitt Merle, MD.   I have reviewed the above documentation for accuracy and completeness, and I agree with the above.

## 2020-06-20 MED FILL — LENVIMA 12 MG DAILY DOSE 4: 3 X 4 | 30 days supply | Qty: 90 | Fill #1

## 2020-06-21 ENCOUNTER — Inpatient Hospital Stay: Payer: Medicaid Other

## 2020-06-21 ENCOUNTER — Telehealth: Payer: Self-pay | Admitting: Hematology

## 2020-06-21 ENCOUNTER — Encounter: Payer: Self-pay | Admitting: Hematology

## 2020-06-21 ENCOUNTER — Other Ambulatory Visit: Payer: Self-pay

## 2020-06-21 ENCOUNTER — Encounter: Payer: Self-pay | Admitting: *Deleted

## 2020-06-21 ENCOUNTER — Inpatient Hospital Stay (HOSPITAL_BASED_OUTPATIENT_CLINIC_OR_DEPARTMENT_OTHER): Payer: Medicaid Other | Admitting: Hematology

## 2020-06-21 VITALS — BP 128/101 | HR 93 | Temp 97.5°F | Resp 18 | Ht 66.0 in | Wt 151.9 lb

## 2020-06-21 DIAGNOSIS — C7951 Secondary malignant neoplasm of bone: Secondary | ICD-10-CM

## 2020-06-21 DIAGNOSIS — D500 Iron deficiency anemia secondary to blood loss (chronic): Secondary | ICD-10-CM

## 2020-06-21 DIAGNOSIS — C220 Liver cell carcinoma: Secondary | ICD-10-CM

## 2020-06-21 LAB — CBC WITH DIFFERENTIAL (CANCER CENTER ONLY)
Abs Immature Granulocytes: 0 10*3/uL (ref 0.00–0.07)
Basophils Absolute: 0.1 10*3/uL (ref 0.0–0.1)
Basophils Relative: 2 %
Eosinophils Absolute: 0.2 10*3/uL (ref 0.0–0.5)
Eosinophils Relative: 4 %
HCT: 34.4 % — ABNORMAL LOW (ref 39.0–52.0)
Hemoglobin: 11 g/dL — ABNORMAL LOW (ref 13.0–17.0)
Immature Granulocytes: 0 %
Lymphocytes Relative: 27 %
Lymphs Abs: 1.5 10*3/uL (ref 0.7–4.0)
MCH: 29.9 pg (ref 26.0–34.0)
MCHC: 32 g/dL (ref 30.0–36.0)
MCV: 93.5 fL (ref 80.0–100.0)
Monocytes Absolute: 0.4 10*3/uL (ref 0.1–1.0)
Monocytes Relative: 8 %
Neutro Abs: 3.2 10*3/uL (ref 1.7–7.7)
Neutrophils Relative %: 59 %
Platelet Count: 332 10*3/uL (ref 150–400)
RBC: 3.68 MIL/uL — ABNORMAL LOW (ref 4.22–5.81)
RDW: 18.4 % — ABNORMAL HIGH (ref 11.5–15.5)
WBC Count: 5.4 10*3/uL (ref 4.0–10.5)
nRBC: 0 % (ref 0.0–0.2)

## 2020-06-21 LAB — CMP (CANCER CENTER ONLY)
ALT: 117 U/L — ABNORMAL HIGH (ref 0–44)
AST: 168 U/L — ABNORMAL HIGH (ref 15–41)
Albumin: 2.9 g/dL — ABNORMAL LOW (ref 3.5–5.0)
Alkaline Phosphatase: 221 U/L — ABNORMAL HIGH (ref 38–126)
Anion gap: 12 (ref 5–15)
BUN: 25 mg/dL — ABNORMAL HIGH (ref 8–23)
CO2: 22 mmol/L (ref 22–32)
Calcium: 9.9 mg/dL (ref 8.9–10.3)
Chloride: 109 mmol/L (ref 98–111)
Creatinine: 1.23 mg/dL (ref 0.61–1.24)
GFR, Est AFR Am: >60 mL/min (ref >60)
GFR, Estimated: >60 mL/min (ref >60)
Glucose, Bld: 105 mg/dL — ABNORMAL HIGH (ref 70–99)
Potassium: 4.1 mmol/L (ref 3.5–5.1)
Sodium: 143 mmol/L (ref 135–145)
Total Bilirubin: 0.9 mg/dL (ref 0.3–1.2)
Total Protein: 8.2 g/dL — ABNORMAL HIGH (ref 6.5–8.1)

## 2020-06-21 MED ORDER — PROCHLORPERAZINE MALEATE 10 MG PO TABS
10.0000 mg | ORAL_TABLET | Freq: Four times a day (QID) | ORAL | 1 refills | Status: AC | PRN
Start: 2020-06-21 — End: ?

## 2020-06-21 MED ORDER — OXYCODONE HCL 10 MG PO TABS: 10.0000 mg | ORAL_TABLET | ORAL | 0 refills | Status: DC | PRN

## 2020-06-21 MED ORDER — XTAMPZA ER 13.5 MG PO C12A: 13.5000 mg | EXTENDED_RELEASE_CAPSULE | Freq: Two times a day (BID) | ORAL | 0 refills | Status: DC

## 2020-06-21 MED ORDER — DRONABINOL 2.5 MG PO CAPS
2.5000 mg | ORAL_CAPSULE | Freq: Two times a day (BID) | ORAL | 0 refills | Status: DC
Start: 2020-06-21 — End: 2020-07-30

## 2020-06-21 MED FILL — oxyCODONE HCL 10 MG TABS: 10 | 15 days supply | Qty: 90 | Fill #0

## 2020-06-21 MED FILL — PROCHLORPERAZINE 10 MG TAB: 10 | 7 days supply | Qty: 30 | Fill #0

## 2020-06-21 NOTE — Telephone Encounter (Signed)
Scheduled per los. Gave avs and calendar  

## 2020-06-21 NOTE — Progress Notes (Signed)
[facility] and [facility] Short acting [facility]one and the oxycontin  was canceled also.

## 2020-06-22 ENCOUNTER — Encounter: Payer: Self-pay | Admitting: Hematology

## 2020-06-24 MED FILL — XTAMPZA ER 13.5 MG C12A: 13.5 | 15 days supply | Qty: 30 | Fill #0

## 2020-06-26 ENCOUNTER — Other Ambulatory Visit: Payer: Self-pay

## 2020-06-26 ENCOUNTER — Telehealth: Payer: Self-pay | Admitting: Radiation Oncology

## 2020-06-26 DIAGNOSIS — R110 Nausea: Secondary | ICD-10-CM

## 2020-06-26 DIAGNOSIS — C7951 Secondary malignant neoplasm of bone: Secondary | ICD-10-CM

## 2020-06-26 DIAGNOSIS — C220 Liver cell carcinoma: Secondary | ICD-10-CM

## 2020-06-26 MED ORDER — ONDANSETRON HCL 8 MG PO TABS: 8.0000 mg | ORAL_TABLET | Freq: Three times a day (TID) | ORAL | 2 refills | Status: AC | PRN

## 2020-06-26 NOTE — Telephone Encounter (Signed)
  Radiation Oncology         [phone]) [phone] ________________________________  Name: PIERRE DELLAROCCO MRN: [id number]  Date of Service: 06/26/2020  DOB: 1961/11/18  Post Treatment Telephone Note  Diagnosis:   Recurrent metastatic hepatocellular carcinoma involving the sacrum, lumbar, and thoracic spine.  Interval Since Last Radiation:  6 weeks   05/02/20-05/15/20: The T9-12 and L5 including sacrum were treated to 30 Gy in 10 fractions.  Narrative:  The patient was contacted today for routine follow-up. During treatment he did very well with radiotherapy and did not have significant desquamation. He reports he is doing fairly well but still has pain in his low back despite radiotherapy. Dr. Burr Medico has been trying to manage this and has been filling oxycodone.    Impression/Plan: 1. Recurrent metastatic hepatocellular carcinoma involving the sacrum, lumbar, and thoracic spine. The patient has been doing well since completion of radiotherapy. Unfortunately he continues to have significant pain in his low back and continues with narcotic pain medication which does seem to help. We discussed that we would be happy to continue to follow him as needed, but he will also continue to follow up with Dr. Burr Medico in medical oncology.     Carola Rhine, PAC

## 2020-07-01 NOTE — Progress Notes (Addendum)
Vernon [name]   [phone] [phone]) [phone]   Clinic Follow up Note   Patient Care Team: Patient, No Pcp Per as PCP - General (General Practice) Clent Jacks, RN as Registered Nurse Truitt Merle, MD as Consulting Physician (Hematology) Jonnie Finner, RN as Oncology Nurse Navigator Roosevelt Locks, CRNP as Nurse Practitioner (Nurse Practitioner)   I connected with Vernon [name] on 07/05/2020 at  3:00 PM EDT by telephone visit and verified that I am speaking with the correct person using two identifiers.  I discussed the limitations, risks, security and privacy concerns of performing an evaluation and management service by telephone and the availability of in person appointments. I also discussed with the patient that there may be a patient responsible charge related to this service. The patient expressed understanding and agreed to proceed.   Other persons participating in the visit and their role in the encounter:  None   Patient's location:  His home  Provider's location:  My office   CHIEF COMPLAINT:  F/u of PE and Multifocal [place] metastatic to bone  SUMMARY OF ONCOLOGIC HISTORY: Oncology History Overview Note  Cancer Staging No matching staging information was found for the patient.    Hepatocellular carcinoma ([place])  07/15/2018 Imaging   MRI Liver  IMPRESSION: Multiple hypervascular liver masses, highly suspicious for malignancy. Differential diagnosis includes multifocal hepatocellular carcinoma and metastatic disease. Consider ultrasound-guided percutaneous needle biopsy for tissue diagnosis.   No other abdominal soft tissue masses or lymphadenopathy.   07/29/2018 Initial Diagnosis   Hepatocellular carcinoma ([place])   08/11/2018 PET scan   IMPRESSION: No hypermetabolic masses identified within the liver, or elsewhere within the neck, chest, abdomen, or pelvis. This makes hepatic metastases extremely unlikely, however multifocal  hepatocellular carcinoma cannot be excluded. Recommend correlation with tumor markers, and consider ultrasound-guided needle biopsy for tissue diagnosis.   09/08/2018 Initial Biopsy   DIAGNOSIS:  A. LIVER MASS; ULTRASOUND-GUIDED BIOPSY:  - HEPATOCELLULAR CARCINOMA, MODERATELY DIFFERENTIATED (WHO 2019).  - BACKGROUND LIVER WITH FOCALLY INTENSE LYMPHOPLASMACYTIC INFILTRATE AND  AREAS WORRISOME FOR PARENCHYMAL NECROSIS, SEE COMMENT.   Comment:  The patient's history of hepatitis C is noted.  The background liver  demonstrates areas of intense lymphoplasmacytic inflammation with brisk  piecemeal necrosis.  Areas worrisome for parenchymal necrosis are  present.  These findings raise the possibility of an underlying and  concurrent autoimmune hepatitis.  Recommend autoimmune hepatitis  serologies (e.g. ANA).  These findings were discussed with Dr. Mike Gip  on 09/09/2018.    Bone metastasis ([place])  04/24/2020 Progression   CT CAP IMPRESSION: 1. Significant progression of multifocal bilateral hepatocellular carcinoma. 2. Osseous metastasis, including a dominant right-sided L4 lesion. Consider further evaluation with lumbar and possibly thoracic spine MRI, given the size of the dominant right-sided L4 lesion. 3. Right-sided pulmonary embolism, without evidence of right heart strain. 4. No evidence of metastatic disease within the chest. 5. 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 6. Aortic atherosclerosis (ICD10-I70.0), coronary artery atherosclerosis and emphysema (ICD10-J43.9).   04/25/2020 Imaging   MRI Lumbar and Thoracic Spine  IMPRESSION: Thoracic spine:   1. Metastatic lesions involving the T12 vertebral body and T9 right posterior elements. 2. Mild right neuroforaminal stenosis at T9-T10 due to tumor extension.   Lumbar spine:   1. Large 5.0 x 6.0 cm metastatic lesion involving the right aspect of the L4 vertebral body with paravertebral  and epidural tumor extension. 2. Partially visualized 4.0 x 2.9 cm metastatic lesion in  the left sacral ala. 3. Relatively diffuse enhancement of the other lumbar vertebral bodies without focality, also concerning for metastatic disease. 4. Severe spinal canal stenosis at L3-L4 and L4-L5 due primarily to degenerative disc disease and prominent posterior epidural fat. 5. Moderate to severe right neuroforaminal stenosis at L4-L5 due to tumor extension.   04/30/2020 Initial Diagnosis   Bone metastasis ([place])   05/02/2020 - 05/15/2020 Radiation Therapy   palliative radiation with Dr Lisbeth Renshaw to his L4 lesion and T9-10 lesions 05/02/20-05/15/20.    05/20/2020 Imaging   Whole body Bone Scan  IMPRESSION: Increased activity noted over T9 and T12 vertebral bodies consistent prior findings suggesting metastatic disease. No other definite abnormalities identified. Bony disease is more evident on prior MRI and CT studies.   05/31/2020 - 06/28/2020 Chemotherapy   Lenvatinib start at '4mg'$  daily and increase to '12mg'$  daily over 2 weeks starting 05/31/20. He remains on '8mg'$  on 06/21/20 due to nausea. He did try 12 mg but stopped end of July due to nausea and GI issues.      Chemotherapy   PENDING IV Nivolumab and avastin q3weeks         CURRENT THERAPY:  Lenvatinibstart at '4mg'$  daily and increase to '12mg'$  dailyover 2 weeks starting 05/31/20.He remains on '8mg'$  on 06/21/20 due to nausea. He did try 12 mg but stopped end of July due to nausea and GI issues.  PENDING IV Nivolumab and avastin q3weeks    INTERVAL HISTORY:  Vernon [name] is here for a follow up of treatment. He has not take Lykens in the past weeks. He notes he did try 3 capsules a day but had nausea and stomach issues. He notes off pill he can eat mildly better. He notes not much help from Marinol. He did note having better appetite with Marijuana.  For pain he has been using Xtampza BID and Oxycodone about 2 times a day.    REVIEW OF SYSTEMS:     Constitutional: Denies fevers, chills or abnormal weight loss Eyes: Denies blurriness of vision Ears, nose, mouth, throat, and face: Denies mucositis or sore throat Respiratory: Denies cough, dyspnea or wheezes Cardiovascular: Denies palpitation, chest discomfort or lower extremity swelling Gastrointestinal:  Denies nausea, heartburn or change in bowel habits Skin: Denies abnormal skin rashes Lymphatics: Denies new lymphadenopathy or easy bruising Neurological:Denies numbness, tingling or new weaknesses Behavioral/Psych: Mood is stable, no new changes  All other systems were reviewed with the patient and are negative.  MEDICAL HISTORY:  Past Medical History:  Diagnosis Date  . Gout   . Hepatitis C   . Hepatocellular carcinoma ([place])     SURGICAL HISTORY: No past surgical history on file.  I have reviewed the social history and family history with the patient and they are unchanged from previous note.  ALLERGIES:  is allergic to ms contin [morphine sulfate er].  MEDICATIONS:  Current Outpatient Medications  Medication Sig Dispense Refill  . albuterol (VENTOLIN HFA) 108 (90 Base) MCG/ACT inhaler Take 90 mcg by mouth every 4 (four) hours as needed.    Marland Kitchen apixaban (ELIQUIS) 5 MG TABS tablet Take 5 mg by mouth in the morning and at bedtime.    . dronabinol (MARINOL) 2.5 MG capsule Take 1 capsule (2.5 mg total) by mouth 2 (two) times daily before a meal. 60 capsule 0  . gabapentin (NEURONTIN) 300 MG capsule Take 1 capsule (300 mg total) by mouth 3 (three) times daily. 90 capsule 1  . Lenvatinib 12 mg daily dose (  LENVIMA) 3 x 4 MG capsule Take 12 mg by mouth daily. Start at '4mg'$  daily for first week, then increase to '8mg'$  daily for second week and '12mg'$  daily for third week if tolerates well (Patient not taking: Reported on 06/21/2020) 90 capsule 1  . ondansetron (ZOFRAN) 8 MG tablet Take 1 tablet (8 mg total) by mouth every 8 (eight) hours as needed for nausea or vomiting. 20 tablet 2  .  oxyCODONE ER (XTAMPZA ER) 13.5 MG C12A Take 13.5 mg by mouth every 12 (twelve) hours. 30 capsule 0  . Oxycodone HCl 10 MG TABS Take 1 tablet (10 mg total) by mouth every 4 (four) hours as needed. OK to refill on 05/18/2020 90 tablet 0  . polyethylene glycol (MIRALAX / GLYCOLAX) 17 g packet Take 17 g by mouth daily as needed for mild constipation. 14 each 0  . polyethylene glycol powder (GLYCOLAX/MIRALAX) 17 GM/SCOOP powder SMARTSIG:1 Capful(s) By Mouth Daily PRN    . prochlorperazine (COMPAZINE) 10 MG tablet Take 1 tablet (10 mg total) by mouth every 6 (six) hours as needed for nausea or vomiting. 30 tablet 1   No current facility-administered medications for this visit.    PHYSICAL EXAMINATION: ECOG PERFORMANCE STATUS: 2 - Symptomatic, <50% confined to bed  No vitals taken today, Exam not performed today   LABORATORY DATA:  I have reviewed the data as listed CBC Latest Ref Rng & Units 06/21/2020 05/31/2020 05/14/2020  WBC 4.0 - 10.5 K/uL 5.4 4.5 2.6(L)  Hemoglobin 13.0 - 17.0 g/dL 11.0(L) 8.9(L) 8.7(L)  Hematocrit 39 - 52 % 34.4(L) 28.7(L) 27.7(L)  Platelets 150 - 400 K/uL 332 257 169     CMP Latest Ref Rng & Units 06/21/2020 05/31/2020 05/14/2020  Glucose 70 - 99 mg/dL 105(H) 115(H) 100(H)  BUN 8 - 23 mg/dL 25(H) 11 28(H)  Creatinine 0.61 - 1.24 mg/dL 1.23 1.12 1.14  Sodium 135 - 145 mmol/L 143 143 136  Potassium 3.5 - 5.1 mmol/L 4.1 4.2 4.2  Chloride 98 - 111 mmol/L 109 108 102  CO2 22 - 32 mmol/L '22 23 24  '$ Calcium 8.9 - 10.3 mg/dL 9.9 9.5 9.3  Total Protein 6.5 - 8.1 g/dL 8.2(H) 7.7 7.1  Total Bilirubin 0.3 - 1.2 mg/dL 0.9 0.6 0.5  Alkaline Phos 38 - 126 U/L 221(H) 228(H) 192(H)  AST 15 - 41 U/L 168(H) 126(H) 119(H)  ALT 0 - 44 U/L 117(H) 88(H) 134(H)      RADIOGRAPHIC STUDIES: I have personally reviewed the radiological images as listed and agreed with the findings in the report. No results found.   ASSESSMENT & PLAN:  Vernon [name] is a 59 y.o. male with    1.  Multifocal HCC, Metastatic to bone, stage IV -He was initially diagnosed in 08/2018 then lost follow up after 09/14/18.Presented to hospital with significant left hip pain from L4 bone metastasis,at this visit, his biopsy, lab, and imaging results were discussed with him.Systemic treatment was discussed (sorafenib versus lenvatinib). -Due to worsening right hip pain he had work up done by his PCP. His 5/26/21CT scan results show significant progression of multifocal bilateral hepatocellular carcinoma, osseous metastasis including a dominant right-sided L4 lesion, right-sided PE without evidence of heart strain, no evidence of metastatic disease in the chest, right portal and hepatic vein occlusion presumably by tumor thrombus. -MRI thoracic and lumbar spine from 04/25/20 showedmultiple bone mets. His bone scan from 05/20/20 showsincreased activity noted over T9 and T12. -Heunderwentpalliative radiation to his L4 lesionand T9-10 lesions6/3/21-6/16/21. -  I started him on Lenvatinib on 05/31/20. He started with titrating dose at '4mg'$  dailytoday for first week, and dose was increased every 1-2 weeks.  She did not tolerate 8 mg milligram well with nausea and low appetite, he tried '12mg'$  but stopped at the end of July due to nausea and GI issues.  -I discussed changing trastment to IV treatment as this may be more tolerable for him. I discussed switching to IV immunotherapy with Tecentriq and Biological agent bevacizumab q3weeks. I reviewed possible side effects with him, which include but not limited to, fatigue, pneumonitis, colitis, thyroid dysfunction, other endocrine disorders, or autoimmune related disease, hypertension, proteinuria, risk of bleeding and thrombosis, bowel perforation, etc.  He is willing to try. Plan to start 8/20.  -He has no image evidence of portal hypertension from his liver cirrhosis, I think he will be okay to take bevacizumab.  He was previously referred to [facility] GI for  endoscopy, but his appointment has not been scheduled.  I contacted [facility], they were not able to reach him.  I gave patient Daylene Katayama phone number, he will call to schedule.   2.Right hip painsecondary to L4 bone met -For the past several months he has been experiencing right hip pain which has gradually worsened. Painhas beensevere, throbbing in quality, and radiating down the right thigh. -His pain is controlled on Xtampza 13.'5mg'$  BID and Oxycodone '10mg'$  about twice a day. -He is also taking Gabapentin '300mg'$ .  -I will discussed role of Zometa infusions at next visit.   3. Low Appetite, Nausea  -He has lowered appetite and weight loss secondary to #1. Since completing RT he has been trying to increase appetite. Weight has been fluctuating.  -Mirtazapine and Marinol have not helped as much as Marijuana and he would like to continue as needed.  -Nausea improved off Lenvatinib   4.History ofPE  -Seen on 04/24/20 CT Chest, incidentally. 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 -I previously encouraged him to take supplement to help his anemia.  6.Hepatitis C and liver cirrhosis -He was referred to liver clinic formanagement of his hepatitis C. He has never received treatment for hepatitis C.He has never had a prior EGD. -Continue follow-up with liver clinic NP Dawn. I will check on his GI refer for EGD  7. Social support  -The patient is single but lives with his girlfriend. He has 3 children. -His daughter will be his main caregiver,will fill out her FMLA paperworkas needed. -Ipreviouslyreferred him to [facility]   PLAN: -he has stopped Lenvima due to poor tolerance -I will switch his treatment to  Tecentriq and bevacizumab every 3 weeks, starting on 8/20 -I contacted [facility] regarding his referral, they called him multiple times but could not reach him, I gave pt Timberon's number and he will call to schedule  -f/u on 8/20 -CT abdomen and bone scan   No problem-specific Assessment & Plan notes found for this encounter.   No orders of the defined types were placed in this encounter.  I discussed the assessment and treatment plan with the patient. The patient was provided an opportunity to ask questions and all were answered. The patient agreed with the plan and demonstrated an understanding of the instructions.  The patient was advised to call  back or seek an in-person evaluation if the symptoms worsen or if the condition fails to improve as anticipated.  The total time spent in the appointment was 30 minutes.    Truitt Merle, MD 07/05/2020   I, Joslyn Devon, am acting as scribe for Truitt Merle, MD.   I have reviewed the above documentation for accuracy and completeness, and I agree with the above.

## 2020-07-04 ENCOUNTER — Telehealth: Payer: Self-pay | Admitting: Hematology

## 2020-07-04 NOTE — Telephone Encounter (Signed)
Contacted patient to verify phone visit for pre reg °

## 2020-07-05 ENCOUNTER — Inpatient Hospital Stay: Payer: Medicaid Other | Attending: Hematology | Admitting: Hematology

## 2020-07-05 DIAGNOSIS — C220 Liver cell carcinoma: Secondary | ICD-10-CM

## 2020-07-05 DIAGNOSIS — C7951 Secondary malignant neoplasm of bone: Secondary | ICD-10-CM

## 2020-07-07 ENCOUNTER — Encounter: Payer: Self-pay | Admitting: Hematology

## 2020-07-07 NOTE — Addendum Note (Signed)
Addended by: Truitt Merle on: 07/07/2020 10:50 PM   Modules accepted: Orders

## 2020-07-07 NOTE — Progress Notes (Signed)
START OFF PATHWAY REGIMEN - Other   [id number]:Atezolizumab 1,200 mg IV D1 + Bevacizumab 15 mg/kg IV D1 q21 Days:   A cycle is every 21 Days:     Atezolizumab      Bevacizumab-xxxx   **Always confirm dose/schedule in your pharmacy ordering system**  Patient Characteristics: Intent of Therapy: Non-Curative / Palliative Intent, Discussed with Patient 

## 2020-07-08 ENCOUNTER — Telehealth: Payer: Self-pay

## 2020-07-08 NOTE — Telephone Encounter (Signed)
I left vm for Mr Vernon [name] to call me so that I can have him cal [facility] to set up appt.  GI [phone].

## 2020-07-09 ENCOUNTER — Telehealth: Payer: Self-pay | Admitting: Hematology

## 2020-07-09 NOTE — Telephone Encounter (Signed)
Scheduled per 8/9 sch message. Unable to reach pt. Left voicemail with appt time and date. 

## 2020-07-10 ENCOUNTER — Telehealth: Payer: Self-pay

## 2020-07-10 NOTE — Telephone Encounter (Signed)
Mr Pursifull called requesting refill for Xtampza.  He requests it be sent to [facility]. Forwarded to Dr. Burr Medico.

## 2020-07-11 ENCOUNTER — Other Ambulatory Visit: Payer: Self-pay | Admitting: Hematology

## 2020-07-11 ENCOUNTER — Encounter: Payer: Self-pay | Admitting: Physician Assistant

## 2020-07-11 ENCOUNTER — Other Ambulatory Visit: Payer: Self-pay | Admitting: Nurse Practitioner

## 2020-07-11 MED ORDER — XTAMPZA ER 13.5 MG PO C12A: 13.5000 mg | EXTENDED_RELEASE_CAPSULE | Freq: Two times a day (BID) | ORAL | 0 refills | Status: AC

## 2020-07-13 NOTE — Progress Notes (Signed)
  Radiation Oncology         [phone]) [phone] ________________________________  Name: Vernon [name] MRN: [id number]  Date: 05/15/2020  DOB: October 29, 1961  End of Treatment Note  Diagnosis:   bone metastasis     Indication for treatment::  palliative       Radiation treatment dates:   05/02/20 - 05/15/20  Site/dose:   1. The L5 spine was treated to a dose of 30 Gy in 10 fractions using an isodose plan. This consisted of 4 fields/ 3D conformal technique. 2. The T9-12 was treated to a dose of 30 Gy in 10 fractions using an isodose plan. This consisted of 2 fields.   Narrative: The patient tolerated radiation treatment relatively well.     Plan: The patient has completed radiation treatment. The patient will return to radiation oncology clinic for routine followup in one month. I advised the patient to call or return sooner if they have any questions or concerns related to their recovery or treatment. ________________________________  Jodelle Gross, M.D., Ph.D.

## 2020-07-15 NOTE — Progress Notes (Signed)
Pharmacist Chemotherapy Monitoring - Initial Assessment    Anticipated start date: 07/19/2020   Regimen:  . Are orders appropriate based on the patient's diagnosis, regimen, and cycle? Yes . Does the plan date match the patient's scheduled date? Yes . Is the sequencing of drugs appropriate? Yes . Are the premedications appropriate for the patient's regimen? Yes . Prior Authorization for treatment is: Approved o If applicable, is the correct biosimilar selected based on the patient's insurance? yes  Organ Function and Labs: Marland Kitchen Are dose adjustments needed based on the patient's renal function, hepatic function, or hematologic function? No . Are appropriate labs ordered prior to the start of patient's treatment? Yes . Other organ system assessment, if indicated: bevacizumab: baseline BP . The following baseline labs, if indicated, have been ordered: atezolizumab: baseline TSH +/- T4 and bevacizumab: urine protein  Dose Assessment: . Are the drug doses appropriate? Yes . Are the following correct: o Drug concentrations Yes o IV fluid compatible with drug Yes o Administration routes Yes o Timing of therapy Yes . If applicable, does the patient have documented access for treatment and/or plans for port-a-cath placement? no . If applicable, have lifetime cumulative doses been properly documented and assessed? not applicable Lifetime Dose Tracking  No doses have been documented on this patient for the following tracked chemicals: Doxorubicin, Epirubicin, Idarubicin, Daunorubicin, Mitoxantrone, Bleomycin, Oxaliplatin, Carboplatin, Liposomal Doxorubicin  o   Toxicity Monitoring/Prevention: . The patient has the following take home antiemetics prescribed: Ondansetron and Prochlorperazine . The patient has the following take home medications prescribed: N/A . Medication allergies and previous infusion related reactions, if applicable, have been reviewed and addressed. Yes . The patient's current  medication list has been assessed for drug-drug interactions with their chemotherapy regimen. no significant drug-drug interactions were identified on review.  Order Review: . Are the treatment plan orders signed? Yes . Is the patient scheduled to see a provider prior to their treatment? Yes  I verify that I have reviewed each item in the above checklist and answered each question accordingly.  Eddie Candle, PharmD PGY-2 Hematology/Oncology Pharmacy Resident  07/15/2020 9:49 AM

## 2020-07-17 ENCOUNTER — Other Ambulatory Visit: Payer: Self-pay | Admitting: Hematology

## 2020-07-17 ENCOUNTER — Other Ambulatory Visit: Payer: Self-pay

## 2020-07-17 ENCOUNTER — Encounter (HOSPITAL_COMMUNITY)
Admission: RE | Admit: 2020-07-17 | Discharge: 2020-07-17 | Disposition: A | Discharge status: Home / Self Care | Payer: Medicaid Other | Source: Ambulatory Visit | Attending: Hematology | Admitting: Hematology

## 2020-07-17 DIAGNOSIS — C220 Liver cell carcinoma: Secondary | ICD-10-CM | POA: Insufficient documentation

## 2020-07-17 MED ORDER — TECHNETIUM TC 99M MEDRONATE IV KIT
19.6000 | PACK | Freq: Once | INTRAVENOUS | Status: AC | PRN
  Administered 2020-07-17: 19.6 via INTRAVENOUS

## 2020-07-18 ENCOUNTER — Telehealth: Payer: Self-pay | Admitting: *Deleted

## 2020-07-18 ENCOUNTER — Other Ambulatory Visit: Payer: Self-pay | Admitting: Hematology

## 2020-07-18 ENCOUNTER — Telehealth: Payer: Self-pay | Admitting: Hematology

## 2020-07-18 ENCOUNTER — Ambulatory Visit (HOSPITAL_COMMUNITY)
Admission: RE | Admit: 2020-07-18 | Discharge: 2020-07-18 | Disposition: A | Discharge status: Home / Self Care | Payer: Medicaid Other | Source: Ambulatory Visit | Attending: Hematology | Admitting: Hematology

## 2020-07-18 DIAGNOSIS — I2699 Other pulmonary embolism without acute cor pulmonale: Secondary | ICD-10-CM

## 2020-07-18 DIAGNOSIS — C220 Liver cell carcinoma: Secondary | ICD-10-CM | POA: Diagnosis present

## 2020-07-18 MED ORDER — IOHEXOL 300 MG/ML  SOLN
100.0000 mL | Freq: Once | INTRAMUSCULAR | Status: AC | PRN
  Administered 2020-07-18: 100 mL via INTRAVENOUS

## 2020-07-18 MED ORDER — SODIUM CHLORIDE (PF) 0.9 % IJ SOLN
INTRAMUSCULAR | Status: AC
  Filled 2020-07-18: qty 50

## 2020-07-18 NOTE — Telephone Encounter (Signed)
Message to scheduler to move lab/MD appt to after CT & call pt 1st thing in am.  Left message on pt's vm to come for CT as scheduled & hopefully labs & MD visit can be moved to later.

## 2020-07-18 NOTE — Telephone Encounter (Signed)
I received a phone call from radiology about his CT AP which was done today.  The scan showed cancer progression, and acute pulmonary embolism in the right lower lobe of lung.  Patient has history of acute PE, currently on Eliquis 5 mg twice twice daily.  Will call patient, to increase his Eliquis to 10 mg twice daily.  We will request a stat CTA of chest to evaluate his PE burden tomorrow morning, before his appointment with [place] tomorrow.  He is scheduled to start atezolizumab and bevacizumab tomorrow, will hold on bevacizumab for now due to his PE. I will see him tomorrow.   Vernon [name]  07/18/2020

## 2020-07-18 NOTE — Telephone Encounter (Signed)
Reached pt & informed of appt for tomorrow for CT angio & reminded of other appts. He denies any problems breathing.

## 2020-07-19 ENCOUNTER — Emergency Department: Payer: Medicaid Other

## 2020-07-19 ENCOUNTER — Encounter (HOSPITAL_COMMUNITY): Payer: Self-pay

## 2020-07-19 ENCOUNTER — Encounter: Payer: Self-pay | Admitting: Emergency Medicine

## 2020-07-19 ENCOUNTER — Inpatient Hospital Stay: Payer: Medicaid Other

## 2020-07-19 ENCOUNTER — Emergency Department
Admission: EM | Admit: 2020-07-19 | Discharge: 2020-07-19 | Disposition: A | Discharge status: Home / Self Care | Payer: Medicaid Other | Attending: Emergency Medicine | Admitting: Emergency Medicine

## 2020-07-19 ENCOUNTER — Ambulatory Visit: Payer: Medicaid Other | Admitting: Hematology

## 2020-07-19 ENCOUNTER — Emergency Department (HOSPITAL_COMMUNITY)
Admission: EM | Admit: 2020-07-19 | Discharge: 2020-07-20 | Disposition: A | Discharge status: Home / Self Care | Payer: Medicaid Other | Attending: Emergency Medicine | Admitting: Emergency Medicine

## 2020-07-19 ENCOUNTER — Inpatient Hospital Stay: Payer: Medicaid Other | Admitting: Hematology

## 2020-07-19 ENCOUNTER — Emergency Department (HOSPITAL_COMMUNITY): Payer: Medicaid Other

## 2020-07-19 ENCOUNTER — Other Ambulatory Visit: Payer: Self-pay

## 2020-07-19 ENCOUNTER — Other Ambulatory Visit: Payer: Medicaid Other

## 2020-07-19 ENCOUNTER — Ambulatory Visit (HOSPITAL_COMMUNITY): Payer: Medicaid Other

## 2020-07-19 DIAGNOSIS — I2699 Other pulmonary embolism without acute cor pulmonale: Secondary | ICD-10-CM

## 2020-07-19 DIAGNOSIS — Z79899 Other long term (current) drug therapy: Secondary | ICD-10-CM | POA: Insufficient documentation

## 2020-07-19 DIAGNOSIS — R0602 Shortness of breath: Secondary | ICD-10-CM | POA: Diagnosis present

## 2020-07-19 DIAGNOSIS — Z8505 Personal history of malignant neoplasm of liver: Secondary | ICD-10-CM | POA: Insufficient documentation

## 2020-07-19 DIAGNOSIS — Z87891 Personal history of nicotine dependence: Secondary | ICD-10-CM | POA: Insufficient documentation

## 2020-07-19 DIAGNOSIS — I2782 Chronic pulmonary embolism: Secondary | ICD-10-CM | POA: Insufficient documentation

## 2020-07-19 DIAGNOSIS — C7951 Secondary malignant neoplasm of bone: Secondary | ICD-10-CM | POA: Diagnosis not present

## 2020-07-19 DIAGNOSIS — C220 Liver cell carcinoma: Secondary | ICD-10-CM | POA: Diagnosis not present

## 2020-07-19 DIAGNOSIS — Z7951 Long term (current) use of inhaled steroids: Secondary | ICD-10-CM | POA: Insufficient documentation

## 2020-07-19 DIAGNOSIS — Z7901 Long term (current) use of anticoagulants: Secondary | ICD-10-CM | POA: Diagnosis not present

## 2020-07-19 LAB — CBC WITH DIFFERENTIAL/PLATELET
Abs Immature Granulocytes: 0.01 10*3/uL (ref 0.00–0.07)
Basophils Absolute: 0.1 10*3/uL (ref 0.0–0.1)
Basophils Relative: 2 %
Eosinophils Absolute: 0.4 10*3/uL (ref 0.0–0.5)
Eosinophils Relative: 6 %
HCT: 29.4 % — ABNORMAL LOW (ref 39.0–52.0)
Hemoglobin: 10.2 g/dL — ABNORMAL LOW (ref 13.0–17.0)
Immature Granulocytes: 0 %
Lymphocytes Relative: 28 %
Lymphs Abs: 1.7 10*3/uL (ref 0.7–4.0)
MCH: 30.5 pg (ref 26.0–34.0)
MCHC: 34.7 g/dL (ref 30.0–36.0)
MCV: 88 fL (ref 80.0–100.0)
Monocytes Absolute: 0.5 10*3/uL (ref 0.1–1.0)
Monocytes Relative: 8 %
Neutro Abs: 3.5 10*3/uL (ref 1.7–7.7)
Neutrophils Relative %: 56 %
Platelets: 218 10*3/uL (ref 150–400)
RBC: 3.34 MIL/uL — ABNORMAL LOW (ref 4.22–5.81)
RDW: 18.4 % — ABNORMAL HIGH (ref 11.5–15.5)
WBC: 6.2 10*3/uL (ref 4.0–10.5)
nRBC: 0 % (ref 0.0–0.2)

## 2020-07-19 LAB — COMPREHENSIVE METABOLIC PANEL
ALT: 71 U/L — ABNORMAL HIGH (ref 0–44)
AST: 183 U/L — ABNORMAL HIGH (ref 15–41)
Albumin: 3.2 g/dL — ABNORMAL LOW (ref 3.5–5.0)
Alkaline Phosphatase: 285 U/L — ABNORMAL HIGH (ref 38–126)
Anion gap: 11 (ref 5–15)
BUN: 14 mg/dL (ref 8–23)
CO2: 22 mmol/L (ref 22–32)
Calcium: 9.4 mg/dL (ref 8.9–10.3)
Chloride: 104 mmol/L (ref 98–111)
Creatinine, Ser: 1.21 mg/dL (ref 0.61–1.24)
GFR calc Af Amer: >60 mL/min (ref >60)
GFR calc non Af Amer: >60 mL/min (ref >60)
Glucose, Bld: 99 mg/dL (ref 70–99)
Potassium: 4 mmol/L (ref 3.5–5.1)
Sodium: 137 mmol/L (ref 135–145)
Total Bilirubin: 1.8 mg/dL — ABNORMAL HIGH (ref 0.3–1.2)
Total Protein: 8 g/dL (ref 6.5–8.1)

## 2020-07-19 LAB — TROPONIN I (HIGH SENSITIVITY): Troponin I (High Sensitivity): 10 ng/L (ref <18)

## 2020-07-19 MED ORDER — APIXABAN 5 MG PO TABS: ORAL_TABLET | ORAL | 0 refills | Status: DC

## 2020-07-19 MED ORDER — IOHEXOL 350 MG/ML SOLN
75.0000 mL | Freq: Once | INTRAVENOUS | Status: AC | PRN
  Administered 2020-07-19: 75 mL via INTRAVENOUS

## 2020-07-19 NOTE — ED Triage Notes (Addendum)
Pt to triage via w/c with no distress noted; EMS brought pt in from home for c/o [facility]; pt denies any pain or recent illness; denies cough; CT scan performed yesterday indicates acute PE with liver & bone metastasis; pt is currently taking eloquis

## 2020-07-19 NOTE — Discharge Instructions (Addendum)
I encourage you to restart your blood thinner/Eliquis, please discuss with your PCP as we discussed

## 2020-07-19 NOTE — Progress Notes (Signed)
I reviewed eliquis instructions with Mr Vernon [name].  Per Dr. Burr Medico he is to take 10mg  BID for the first 7 days and then 5 mg Bid thereafter.  He verbalized understanding.  High priority scheduling message sent to reschedule appts cancelled today.

## 2020-07-19 NOTE — ED Notes (Signed)
Rainbow sent to lab

## 2020-07-19 NOTE — ED Notes (Signed)
Pulse Ox while ambulating. Pt able to exit OOB w/o assistance, escorted out of room to hallway. Pulse measurement 102-106bpm; O2 sat 91-93% RA. Merle RN advised [employer]

## 2020-07-19 NOTE — ED Provider Notes (Signed)
[facility] Emergency Department Provider Note   ____________________________________________    I have reviewed the triage vital signs and the nursing notes.   HISTORY  Chief Complaint Shortness of Breath     HPI Vernon [name] is a 59 y.o. male with a history of hepatocellular carcinoma with metastasis as well as a known PE reportedly on Eliquis who presents with complaints of mild shortness of breath.  On review of records patient had a CT abdomen pelvis done yesterday which noted a possible PE.  Patient reports last night he felt somewhat short of breath although feels better now and has no complaints.  It appears that outpatient CTA was planned however not performed.  Patient denies chest pain.  No fevers chills or cough  Past Medical History:  Diagnosis Date  . Gout   . Hepatitis C   . Hepatocellular carcinoma [facility])     Patient Active Problem List   Diagnosis Date Noted  . Bone metastasis ([place]) 04/30/2020  . Iron deficiency anemia due to chronic blood loss 04/26/2020  . Palliative care by specialist   . Right hip pain   . Pain of metastatic malignancy   . Pulmonary embolism without acute cor pulmonale ([place]) 04/25/2020  . 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 Right thigh pain 04/25/2020  . Anemia 04/25/2020  . Acute pulmonary embolism without acute cor pulmonale ([place]) 04/25/2020  . Fatigue 04/25/2020  . Goals of care, counseling/discussion 10/11/2018  . Hepatitis 10/11/2018  . Hepatitis C virus infection without hepatic coma 08/12/2018  . Hepatocellular carcinoma ([place]) 07/29/2018  . Abnormal iron saturation 07/29/2018    History reviewed. No pertinent surgical history.  Prior to Admission medications   Medication Sig Start Date End Date Taking? Authorizing Provider  albuterol (VENTOLIN HFA) 108 (90 Base) MCG/ACT inhaler Take 90 mcg by mouth every 4 (four) hours as needed.     [provider]  apixaban (ELIQUIS) 5 MG TABS tablet Take 5 mg by mouth in the morning and at bedtime.    [provider]  dronabinol (MARINOL) 2.5 MG capsule Take 1 capsule (2.5 mg total) by mouth 2 (two) times daily before a meal. 06/21/20   Truitt Merle, MD  gabapentin (NEURONTIN) 300 MG capsule Take 1 capsule (300 mg total) by mouth 3 (three) times daily. 05/31/20   Truitt Merle, MD  Lenvatinib 12 mg daily dose (LENVIMA) 3 x 4 MG capsule Take 12 mg by mouth daily. Start at 4mg  daily for first week, then increase to 8mg  daily for second week and 12mg  daily for third week if tolerates well Patient not taking: Reported on 06/21/2020 05/27/20   Truitt Merle, MD  ondansetron (ZOFRAN) 8 MG tablet Take 1 tablet (8 mg total) by mouth every 8 (eight) hours as needed for nausea or vomiting. 06/26/20   Truitt Merle, MD  oxyCODONE ER [facility]) 13.5 MG C12A Take 13.5 mg by mouth every 12 (twelve) hours. 07/11/20   Alla Feeling, NP  Oxycodone HCl 10 MG TABS Take 1 tablet (10 mg total) by mouth every 4 (four) hours as needed. OK to refill on 05/18/2020 06/21/20   Truitt Merle, MD  polyethylene glycol (MIRALAX / GLYCOLAX) 17 g packet Take 17 g by mouth daily as needed for mild constipation. 04/26/20   Amin, Jeanella Flattery, MD  polyethylene glycol powder (GLYCOLAX/MIRALAX) 17 GM/SCOOP powder SMARTSIG:1 Capful(s) By Mouth Daily PRN 04/26/20   [provider]  prochlorperazine (COMPAZINE) 10 MG tablet Take 1 tablet (10 mg total) by mouth every 6 (six) hours as needed for nausea or vomiting. 06/21/20   Truitt Merle, MD     Allergies Ms contin [morphine sulfate er]  Family History  Problem Relation Age of Onset  . Cancer Mother   . Cancer Father   . Hypertension Maternal Uncle   . Healthy Daughter   . Healthy Son     Social History Social History   Tobacco Use  . Smoking status: Former Smoker    Years: 18.00    Types: Cigarettes    Quit date: 08/25/2018    Years since quitting: 1.9  . Smokeless  tobacco: Never Used  . Tobacco comment: smokes about 3 cigarretes a month  Vaping Use  . Vaping Use: Never used  Substance Use Topics  . Alcohol use: Yes    Comment: occasional beer  . Drug use: Never    Review of Systems  Constitutional: No fever/chills Eyes: No visual changes.  ENT: No sore throat. Cardiovascular: Denies chest pain. Respiratory: As above Gastrointestinal: No abdominal pain.   Genitourinary: Negative for dysuria. Musculoskeletal: Negative for back pain. Skin: Negative for rash. Neurological: Negative for headaches or weakness   ____________________________________________   PHYSICAL EXAM:  VITAL SIGNS: ED Triage Vitals  Enc Vitals Group     BP 07/19/20 0654 (!) 148/84     Pulse Rate 07/19/20 0654 94     Resp 07/19/20 0654 20     Temp 07/19/20 0654 98 F (36.7 C)     Temp Source 07/19/20 0654 Oral     SpO2 07/19/20 0649 99 %     Weight 07/19/20 0653 70.8 kg (156 lb)     Height 07/19/20 0653 1.702 m (5\' 7" )     Head Circumference --      Peak Flow --      Pain Score 07/19/20 0653 0     Pain Loc --      Pain Edu? --      Excl. in [place]? --     Constitutional: Alert and oriented.   Nose: No congestion/rhinnorhea. Mouth/Throat: Mucous membranes are moist.    Cardiovascular: Normal rate, regular rhythm. Grossly normal heart sounds.  Good peripheral circulation. Respiratory: Normal respiratory effort.  No retractions.  Gastrointestinal: Soft and nontender. No distention.  No CVA tenderness.  Musculoskeletal: No lower extremity tenderness nor edema.  Warm and well perfused Neurologic:  Normal speech and language. No gross focal neurologic deficits are appreciated.  Skin:  Skin is warm, dry and intact. No rash noted. Psychiatric: Mood and affect are normal. Speech and behavior are normal.  ____________________________________________   LABS (all labs ordered are listed, but only abnormal results are displayed)  Labs Reviewed  CBC WITH  DIFFERENTIAL/PLATELET - Abnormal; Notable for the following components:      Result Value   RBC 3.34 (*)    Hemoglobin 10.2 (*)    HCT 29.4 (*)    RDW 18.4 (*)    All other components within normal limits  COMPREHENSIVE METABOLIC PANEL - Abnormal; Notable for the following components:   Albumin 3.2 (*)    AST 183 (*)    ALT 71 (*)    Alkaline Phosphatase 285 (*)    Total Bilirubin 1.8 (*)    All other components within normal limits  TROPONIN I (HIGH SENSITIVITY)   ____________________________________________  EKG  ED ECG REPORT I, Lavonia Drafts, the attending physician, personally viewed and interpreted  this ECG.  Date: 07/19/2020  Rhythm: normal sinus rhythm QRS Axis: normal Intervals: normal ST/T Wave abnormalities: normal Narrative Interpretation: no evidence of acute ischemia  ____________________________________________  RADIOLOGY  Chest x-ray unremarkable  CT angiography demonstrates chronic filling defect/PE improved from prior ____________________________________________   PROCEDURES  Procedure(s) performed: No  Procedures   Critical Care performed: No ____________________________________________   INITIAL IMPRESSION / ASSESSMENT AND PLAN / ED COURSE  Pertinent labs & imaging results that were available during my care of the patient were reviewed by me and considered in my medical decision making (see chart for details).  Patient with known metastatic [place] as well as prior PE reportedly on Eliquis presents with mild shortness of breath now improved.  Reviewed CT scan from yesterday which shows cancer progression but also possible acute PE  Patient admits to me that he has not been taking his Eliquis  CT angiography performed which demonstrates similar PE to before but slightly improved.  Strongly urged patient to take his Eliquis as prescribed, he will discuss with his oncologist.  Lab work today is overall reassuring, elevated LFTs are in line  with prior levels, normal white blood cell count.    ____________________________________________   FINAL CLINICAL IMPRESSION(S) / ED DIAGNOSES  Final diagnoses:  Other chronic pulmonary embolism without acute cor pulmonale ([place])        Note:  This document was prepared using Dragon voice recognition software and may include unintentional dictation errors.   Lavonia Drafts, MD 07/19/20 1102

## 2020-07-19 NOTE — Discharge Instructions (Signed)
Please follow up with Dr. Burr Medico Continue Eliquis 10mg  daily Return to the ER if you are worsening or call 911

## 2020-07-19 NOTE — ED Provider Notes (Signed)
[facility] DEPT Provider Note   CSN: [id number] Arrival date & time: 07/19/20  2119     History Chief Complaint  Patient presents with  . Shortness of Breath    Vernon [name] is a 59 y.o. male with history of metastatic liver cancer who presents with SOB. He states that he had a CT of the abdomen/pelvis yesterday and incidentally an acute PE was noted in the right lung base. His oncologist was notified and she asked the patient to increase his Eliquis to 10mg  twice a day. A stat CTA chest was ordered for today. Pt called EMS for SOB today and they brought him to [facility]. He had a CTA of the chest done in the ER there which showed a chronic nonocclusive PE with decreased thrombus burden and no evidence of right heart strain. The rest of the lungs were clear. Labs appeared stable. Trop was obtained and was 10. He was advised to continue his Eliquis. Pt returns to the ED tonight because of increased SOB. He states it's coming and going. He is unsure of what causes it to increase. He denies fever, chills, chest pain, cough. He has not been vaccinated against COVID.   HPI     Past Medical History:  Diagnosis Date  . Gout   . Hepatitis C   . Hepatocellular carcinoma [facility])     Patient Active Problem List   Diagnosis Date Noted  . Bone metastasis ([place]) 04/30/2020  . Iron deficiency anemia due to chronic blood loss 04/26/2020  . Palliative care by specialist   . Right hip pain   . Pain of metastatic malignancy   . Pulmonary embolism without acute cor pulmonale ([place]) 04/25/2020  . 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 Right thigh pain 04/25/2020  . Anemia 04/25/2020  . Acute pulmonary embolism without acute cor pulmonale ([place]) 04/25/2020  . Fatigue 04/25/2020  . Goals of care, counseling/discussion 10/11/2018  . Hepatitis 10/11/2018  . Hepatitis C virus infection without hepatic coma 08/12/2018  .  Hepatocellular carcinoma ([place]) 07/29/2018  . Abnormal iron saturation 07/29/2018    History reviewed. No pertinent surgical history.     Family History  Problem Relation Age of Onset  . Cancer Mother   . Cancer Father   . Hypertension Maternal Uncle   . Healthy Daughter   . Healthy Son     Social History   Tobacco Use  . Smoking status: Former Smoker    Years: 18.00    Types: Cigarettes    Quit date: 08/25/2018    Years since quitting: 1.9  . Smokeless tobacco: Never Used  . Tobacco comment: smokes about 3 cigarretes a month  Vaping Use  . Vaping Use: Never used  Substance Use Topics  . Alcohol use: Yes    Comment: occasional beer  . Drug use: Never    Home Medications Prior to Admission medications   Medication Sig Start Date End Date Taking? Authorizing Provider  albuterol (VENTOLIN HFA) 108 (90 Base) MCG/ACT inhaler Take 90 mcg by mouth every 4 (four) hours as needed.    [provider]  apixaban (ELIQUIS) 5 MG TABS tablet Take 2 tablets (10mg ) twice daily for 7 days, then 1 tablet (5mg ) twice daily 07/19/20   Truitt Merle, MD  dronabinol (MARINOL) 2.5 MG capsule Take 1 capsule (2.5 mg total) by mouth 2 (two) times daily before a meal. 06/21/20   Truitt Merle, MD  gabapentin (NEURONTIN)  300 MG capsule Take 1 capsule (300 mg total) by mouth 3 (three) times daily. 05/31/20   Truitt Merle, MD  Lenvatinib 12 mg daily dose (LENVIMA) 3 x 4 MG capsule Take 12 mg by mouth daily. Start at 4mg  daily for first week, then increase to 8mg  daily for second week and 12mg  daily for third week if tolerates well Patient not taking: Reported on 06/21/2020 05/27/20   Truitt Merle, MD  ondansetron (ZOFRAN) 8 MG tablet Take 1 tablet (8 mg total) by mouth every 8 (eight) hours as needed for nausea or vomiting. 06/26/20   Truitt Merle, MD  oxyCODONE ER [facility] ER) 13.5 MG C12A Take 13.5 mg by mouth every 12 (twelve) hours. 07/11/20   Alla Feeling, NP  Oxycodone HCl 10 MG TABS Take 1 tablet (10 mg  total) by mouth every 4 (four) hours as needed. OK to refill on 05/18/2020 06/21/20   Truitt Merle, MD  polyethylene glycol (MIRALAX / GLYCOLAX) 17 g packet Take 17 g by mouth daily as needed for mild constipation. 04/26/20   Damita Lack, MD  polyethylene glycol powder (GLYCOLAX/MIRALAX) 17 GM/SCOOP powder SMARTSIG:1 Capful(s) By Mouth Daily PRN 04/26/20   [provider]  prochlorperazine (COMPAZINE) 10 MG tablet Take 1 tablet (10 mg total) by mouth every 6 (six) hours as needed for nausea or vomiting. 06/21/20   Truitt Merle, MD    Allergies    Ms contin [morphine sulfate er]  Review of Systems   Review of Systems  Constitutional: Negative for chills and fever.  Respiratory: Positive for shortness of breath. Negative for cough and wheezing.   Cardiovascular: Negative for chest pain.  All other systems reviewed and are negative.   Physical Exam Updated Vital Signs BP (!) 150/81   Pulse 95   Temp 98.2 F (36.8 C) (Oral)   Resp 20   Ht 5\' 7"  (1.702 m)   Wt 70.8 kg   SpO2 96%   BMI 24.43 kg/m   Physical Exam Vitals and nursing note reviewed.  Constitutional:      General: He is not in acute distress.    Appearance: He is well-developed. He is not ill-appearing.  HENT:     Head: Normocephalic and atraumatic.  Eyes:     General: No scleral icterus.       Right eye: No discharge.        Left eye: No discharge.     Conjunctiva/sclera: Conjunctivae normal.     Pupils: Pupils are equal, round, and reactive to light.  Cardiovascular:     Rate and Rhythm: Normal rate and regular rhythm.  Pulmonary:     Effort: Pulmonary effort is normal. No respiratory distress.     Breath sounds: Normal breath sounds.  Abdominal:     General: There is no distension.     Palpations: Abdomen is soft.     Tenderness: There is no abdominal tenderness.  Musculoskeletal:     Cervical back: Normal range of motion.  Skin:    General: Skin is warm and dry.  Neurological:     Mental Status:  He is alert and oriented to person, place, and time.  Psychiatric:        Behavior: Behavior normal.     ED Results / Procedures / Treatments   Labs (all labs ordered are listed, but only abnormal results are displayed) Labs Reviewed - No data to display  EKG EKG Interpretation  Date/Time:  Friday July 19 2020 22:00:47 EDT Ventricular  Rate:  90 PR Interval:    QRS Duration: 85 QT Interval:  405 QTC Calculation: 496 R Axis:   19 Text Interpretation: Sinus rhythm Borderline prolonged QT interval No significant change since last tracing Confirmed by Dorie Rank [phone]) on 07/19/2020 10:05:34 PM   Radiology DG Chest 2 View  Result Date: 07/19/2020 CLINICAL DATA:  59 year old male with shortness of breath. EXAM: CHEST - 2 VIEW COMPARISON:  Chest radiograph dated 07/19/2020. FINDINGS: No focal consolidation, pleural effusion, or pneumothorax. The cardiac silhouette is within limits. Atherosclerotic calcification of the aorta. No acute osseous pathology. Degenerative changes of the spine and bilateral AC joints. IMPRESSION: No active cardiopulmonary disease. Electronically Signed   By: Anner Crete M.D.   On: 07/19/2020 21:59   DG Chest 2 View  Result Date: 07/19/2020 CLINICAL DATA:  Shortness of breath EXAM: CHEST - 2 VIEW COMPARISON:  Chest CT 04/24/2020 FINDINGS: Normal heart size and mediastinal contours. No acute infiltrate or edema. No effusion or pneumothorax. Known osseous metastatic disease by CT. No acute osseous findings. IMPRESSION: No acute finding. Electronically Signed   By: Monte Fantasia M.D.   On: 07/19/2020 07:25   CT Angio Chest PE W and/or Wo Contrast  Result Date: 07/19/2020 CLINICAL DATA:  Shortness of breath. Possible PE seen on CT 1 day ago. Stage IV multifocal hepatocellular carcinoma. EXAM: CT ANGIOGRAPHY CHEST WITH CONTRAST TECHNIQUE: Multidetector CT imaging of the chest was performed using the standard protocol during bolus administration of intravenous  contrast. Multiplanar CT image reconstructions and MIPs were obtained to evaluate the vascular anatomy. CONTRAST:  16mL OMNIPAQUE IOHEXOL 350 MG/ML SOLN COMPARISON:  CT abdomen-pelvis 07/18/2020.  CT chest 04/24/2020 FINDINGS: Cardiovascular: Satisfactory opacification of the pulmonary arteries to the segmental level. Chronic nonocclusive filling defect within the lobar and segmental branches of the right lower lobe pulmonary artery (series 4, images 50-52). The thrombus burden has decreased compared to prior CT 04/24/2020. No additional filling defects are identified. No central PE. Normal RV to LV ratio without evidence of right heart strain. Thoracic aorta is nonaneurysmal. Atherosclerotic calcification of the aortic arch. Normal heart size. No pericardial effusion. Mediastinum/Nodes: No enlarged mediastinal, hilar, or axillary lymph nodes. Thyroid gland, trachea, and esophagus demonstrate no significant findings. Lungs/Pleura: Similar mild-moderate emphysematous lung changes. No focal airspace consolidation. No pulmonary nodules or masses. No pleural effusion or pneumothorax. Upper Abdomen: Numerous confluent heterogeneous enhancing liver masses, as detailed on CT abdomen pelvis 07/18/2020. No new or acute findings within the visualized portion of the upper abdomen. Musculoskeletal: Lytic metastatic lesion within the left portion of the T12 vertebral body. Expansile lytic lesion centered within the right T9 transverse process (series 4, image 61). No definite new areas of osseous metastasis are identified. No acute fracture. Review of the MIP images confirms the above findings. IMPRESSION: 1. Chronic nonocclusive filling defect within the lobar and segmental branches of the right lower lobe pulmonary artery. The thrombus burden has decreased compared to prior CT 04/24/2020. No additional filling defects are identified. No evidence of right heart strain. 2. Lungs are clear. 3. Osseous metastatic disease as  described above. 4. Numerous confluent heterogeneous enhancing liver masses, as detailed on CT abdomen pelvis 07/18/2020. Aortic Atherosclerosis (ICD10-I70.0) and Emphysema (ICD10-J43.9). Electronically Signed   By: Davina Poke D.O.   On: 07/19/2020 09:06   CT Abdomen Pelvis W Contrast  Result Date: 07/18/2020 CLINICAL DATA:  Stage IV multifocal hepatocellular carcinoma with bone metastases. History of palliative radiation at T9, T10 and L4. Mild going  medical therapy. Restaging. EXAM: CT ABDOMEN AND PELVIS WITH CONTRAST TECHNIQUE: Multidetector CT imaging of the abdomen and pelvis was performed using the standard protocol following bolus administration of intravenous contrast. CONTRAST:  155mL OMNIPAQUE IOHEXOL 300 MG/ML  SOLN COMPARISON:  04/24/2020 CT chest, abdomen and pelvis. FINDINGS: Lower chest: Centrilobular emphysema at the lung bases. Low signal filling defect centrally in the right lower lobe pulmonary artery branch on the uppermost slice (series 2/image 1), potentially acute pulmonary embolus. Hepatobiliary: Numerous bulky confluent heterogeneous hyperenhancing liver masses throughout the liver, increased since 04/24/2020 CT. Representative liver masses as follows: -segment 3 left liver lobe 4.6 x 4.4 cm mass (series 2/image 51), previously 4.3 x 3.7 cm -segment 2 left liver lobe 2.7 x 2.2 cm mass (series 2/image 31), previously 2.0 x 1.5 cm -segment 5 right liver 3.6 x 3.2 cm mass (series 2/image 62), previously 3.0 x 2.5 cm Normal gallbladder with no radiopaque cholelithiasis. No biliary ductal dilatation. Pancreas: Normal, with no mass or duct dilation. Spleen: Normal size. No mass. Adrenals/Urinary Tract: Normal adrenals. Normal kidneys with no hydronephrosis and no renal mass. Normal bladder. Stomach/Bowel: Normal non-distended stomach. Normal caliber small bowel with no small bowel wall thickening. Normal appendix. Normal large bowel with no diverticulosis, large bowel wall thickening or  pericolonic fat stranding. Vascular/Lymphatic: Atherosclerotic nonaneurysmal abdominal aorta. Patent portal, splenic, hepatic and renal veins. No pathologically enlarged lymph nodes in the abdomen or pelvis. Reproductive: Normal size prostate. Other: No pneumoperitoneum, ascites or focal fluid collection. Musculoskeletal: Multiple lytic bone metastases scattered in the visualized spine and sacrum, increased. Representative expansile right L4 vertebral 6.4 x 5.7 cm lesion (series 12/image 48), increased from 6.0 x 4.9 cm. Representative left upper sacral 3.4 x 3.0 cm lesion (series 12/image 63), not previously imaged. Represent of left T12 2.9 x 2.8 cm lesion (series 12/image 23), increased from 2.4 x 2.3 cm. Marked lumbar spondylosis. IMPRESSION: 1. Probable acute pulmonary embolus partially visualized at the right lung base. Further evaluation with PE protocol CT chest angiogram may be obtained as clinically warranted. 2. Widespread liver metastases, increased. 3. Multiple lytic bone metastases, increased. 4. Aortic Atherosclerosis (ICD10-I70.0) and Emphysema (ICD10-J43.9). Critical Value/emergent results were called by telephone at the time of interpretation on 07/18/2020 at 12:54 pm to provider Truitt Merle , who verbally acknowledged these results. Electronically Signed   By: Ilona Sorrel M.D.   On: 07/18/2020 13:18    Procedures Procedures (including critical care time)  Medications Ordered in ED Medications - No data to display  ED Course  I have reviewed the triage vital signs and the nursing notes.  Pertinent labs & imaging results that were available during my care of the patient were reviewed by me and considered in my medical decision making (see chart for details).  59 year old male with intermittent SOB. He is mildly hypertensive but otherwise vitals are normal. Exam is unremarkable. He is satting between 95-98% on RA. Lungs are CTA. Work up was reviewed from ED visit earlier this morning which  was overall reassuring. We went over the results of his work up again. He does not have fever, chills, cough to suggest infectious process but was offered COVID testing which he is refusing. EKG was obtained which shows sinus rhythm with prolonged QT. CXR obtained is negative. He was ambulated in the hall and sats stayed above 90%. Pt was advised to continue his Eliquis and to f/u with Dr. Burr Medico.   MDM Rules/Calculators/A&P  Final Clinical Impression(s) / ED Diagnoses Final diagnoses:  Shortness of breath  Other chronic pulmonary embolism without acute cor pulmonale [facility])    Rx / DC Orders ED Discharge Orders    None       Recardo Evangelist, PA-C 07/19/20 2332    Dorie Rank, MD 07/20/20 1455

## 2020-07-19 NOTE — ED Triage Notes (Signed)
Arrived POV from home. Patient reports he was seen at [facility] today for [facility], but he feels like his symptoms are worsening. Patient states he was told he has blood clot in his lings. Patient reports pain right scapula. SHOB at rest. NAD

## 2020-07-20 NOTE — ED Notes (Signed)
Waiting for family to arrive to d/c since pt is a cancer pt.

## 2020-07-22 ENCOUNTER — Telehealth: Payer: Self-pay

## 2020-07-22 ENCOUNTER — Other Ambulatory Visit: Payer: Self-pay | Admitting: Hematology

## 2020-07-22 MED ORDER — OXYCODONE HCL 10 MG PO TABS
10.0000 mg | ORAL_TABLET | Freq: Four times a day (QID) | ORAL | 0 refills | Status: AC | PRN
Start: 2020-07-22 — End: ?

## 2020-07-22 NOTE — Telephone Encounter (Signed)
Mr [place] called requesting refill for oxycodone 10mg  tablets.  Please send to [facility] [address].

## 2020-07-23 ENCOUNTER — Telehealth: Payer: Self-pay | Admitting: Hematology

## 2020-07-23 ENCOUNTER — Encounter (HOSPITAL_COMMUNITY): Payer: Self-pay

## 2020-07-23 ENCOUNTER — Other Ambulatory Visit: Payer: Self-pay

## 2020-07-23 ENCOUNTER — Telehealth: Payer: Self-pay

## 2020-07-23 DIAGNOSIS — D62 Acute posthemorrhagic anemia: Secondary | ICD-10-CM | POA: Diagnosis present

## 2020-07-23 DIAGNOSIS — Z923 Personal history of irradiation: Secondary | ICD-10-CM

## 2020-07-23 DIAGNOSIS — Z8249 Family history of ischemic heart disease and other diseases of the circulatory system: Secondary | ICD-10-CM

## 2020-07-23 DIAGNOSIS — R55 Syncope and collapse: Secondary | ICD-10-CM | POA: Diagnosis not present

## 2020-07-23 DIAGNOSIS — Z9221 Personal history of antineoplastic chemotherapy: Secondary | ICD-10-CM

## 2020-07-23 DIAGNOSIS — M25551 Pain in right hip: Secondary | ICD-10-CM | POA: Diagnosis present

## 2020-07-23 DIAGNOSIS — C220 Liver cell carcinoma: Secondary | ICD-10-CM | POA: Diagnosis present

## 2020-07-23 DIAGNOSIS — R578 Other shock: Secondary | ICD-10-CM | POA: Diagnosis not present

## 2020-07-23 DIAGNOSIS — C7951 Secondary malignant neoplasm of bone: Secondary | ICD-10-CM | POA: Diagnosis present

## 2020-07-23 DIAGNOSIS — K661 Hemoperitoneum: Principal | ICD-10-CM | POA: Diagnosis present

## 2020-07-23 DIAGNOSIS — N141 Nephropathy induced by other drugs, medicaments and biological substances: Secondary | ICD-10-CM | POA: Diagnosis not present

## 2020-07-23 DIAGNOSIS — K5900 Constipation, unspecified: Secondary | ICD-10-CM | POA: Diagnosis present

## 2020-07-23 DIAGNOSIS — D630 Anemia in neoplastic disease: Secondary | ICD-10-CM | POA: Diagnosis present

## 2020-07-23 DIAGNOSIS — Z7901 Long term (current) use of anticoagulants: Secondary | ICD-10-CM

## 2020-07-23 DIAGNOSIS — J9601 Acute respiratory failure with hypoxia: Secondary | ICD-10-CM | POA: Diagnosis not present

## 2020-07-23 DIAGNOSIS — I700 Atherosclerosis of aorta: Secondary | ICD-10-CM | POA: Diagnosis present

## 2020-07-23 DIAGNOSIS — Z20822 Contact with and (suspected) exposure to covid-19: Secondary | ICD-10-CM | POA: Diagnosis present

## 2020-07-23 DIAGNOSIS — Z87891 Personal history of nicotine dependence: Secondary | ICD-10-CM

## 2020-07-23 DIAGNOSIS — Z809 Family history of malignant neoplasm, unspecified: Secondary | ICD-10-CM

## 2020-07-23 DIAGNOSIS — R739 Hyperglycemia, unspecified: Secondary | ICD-10-CM | POA: Diagnosis present

## 2020-07-23 DIAGNOSIS — Z515 Encounter for palliative care: Secondary | ICD-10-CM | POA: Diagnosis present

## 2020-07-23 DIAGNOSIS — M109 Gout, unspecified: Secondary | ICD-10-CM | POA: Diagnosis present

## 2020-07-23 DIAGNOSIS — K7460 Unspecified cirrhosis of liver: Secondary | ICD-10-CM | POA: Diagnosis present

## 2020-07-23 DIAGNOSIS — Z560 Unemployment, unspecified: Secondary | ICD-10-CM

## 2020-07-23 DIAGNOSIS — I2699 Other pulmonary embolism without acute cor pulmonale: Secondary | ICD-10-CM | POA: Diagnosis present

## 2020-07-23 DIAGNOSIS — R140 Abdominal distension (gaseous): Secondary | ICD-10-CM | POA: Diagnosis present

## 2020-07-23 DIAGNOSIS — Z79899 Other long term (current) drug therapy: Secondary | ICD-10-CM

## 2020-07-23 DIAGNOSIS — N179 Acute kidney failure, unspecified: Secondary | ICD-10-CM | POA: Diagnosis present

## 2020-07-23 DIAGNOSIS — B1920 Unspecified viral hepatitis C without hepatic coma: Secondary | ICD-10-CM | POA: Diagnosis present

## 2020-07-23 DIAGNOSIS — T508X5A Adverse effect of diagnostic agents, initial encounter: Secondary | ICD-10-CM | POA: Diagnosis not present

## 2020-07-23 DIAGNOSIS — E8770 Fluid overload, unspecified: Secondary | ICD-10-CM | POA: Diagnosis not present

## 2020-07-23 DIAGNOSIS — G893 Neoplasm related pain (acute) (chronic): Secondary | ICD-10-CM | POA: Diagnosis present

## 2020-07-23 NOTE — Telephone Encounter (Signed)
Scheduled appt per 8/23 sch msg - pt is aware of appt on 8/30

## 2020-07-23 NOTE — Telephone Encounter (Signed)
Vernon [name] called stating his pharmacy is out of Oxycodone.  He is requesting a prescription be sent to [facility].

## 2020-07-23 NOTE — ED Triage Notes (Signed)
Pt complains of abdominal pain since yesterday, pt states he has cancer all over Pt denies and nausea, vomiting or diarrhea

## 2020-07-24 ENCOUNTER — Emergency Department (HOSPITAL_COMMUNITY): Payer: Medicaid Other

## 2020-07-24 ENCOUNTER — Encounter (HOSPITAL_COMMUNITY): Payer: Self-pay | Admitting: Pulmonary Disease

## 2020-07-24 ENCOUNTER — Inpatient Hospital Stay (HOSPITAL_COMMUNITY)
Admission: EM | Admit: 2020-07-24 | Discharge: 2020-07-30 | DRG: 356 | Disposition: A | Discharge status: Home / Self Care | Payer: Medicaid Other | Attending: Internal Medicine | Admitting: Internal Medicine

## 2020-07-24 DIAGNOSIS — K5900 Constipation, unspecified: Secondary | ICD-10-CM | POA: Diagnosis present

## 2020-07-24 DIAGNOSIS — I2782 Chronic pulmonary embolism: Secondary | ICD-10-CM | POA: Diagnosis not present

## 2020-07-24 DIAGNOSIS — D500 Iron deficiency anemia secondary to blood loss (chronic): Secondary | ICD-10-CM | POA: Diagnosis present

## 2020-07-24 DIAGNOSIS — Z7189 Other specified counseling: Secondary | ICD-10-CM | POA: Diagnosis not present

## 2020-07-24 DIAGNOSIS — R58 Hemorrhage, not elsewhere classified: Secondary | ICD-10-CM

## 2020-07-24 DIAGNOSIS — N179 Acute kidney failure, unspecified: Secondary | ICD-10-CM | POA: Diagnosis present

## 2020-07-24 DIAGNOSIS — Z79899 Other long term (current) drug therapy: Secondary | ICD-10-CM | POA: Diagnosis not present

## 2020-07-24 DIAGNOSIS — R1010 Upper abdominal pain, unspecified: Secondary | ICD-10-CM | POA: Diagnosis not present

## 2020-07-24 DIAGNOSIS — G893 Neoplasm related pain (acute) (chronic): Secondary | ICD-10-CM | POA: Diagnosis not present

## 2020-07-24 DIAGNOSIS — M25551 Pain in right hip: Secondary | ICD-10-CM | POA: Diagnosis present

## 2020-07-24 DIAGNOSIS — I2699 Other pulmonary embolism without acute cor pulmonale: Secondary | ICD-10-CM | POA: Diagnosis not present

## 2020-07-24 DIAGNOSIS — C7951 Secondary malignant neoplasm of bone: Secondary | ICD-10-CM | POA: Diagnosis present

## 2020-07-24 DIAGNOSIS — Z923 Personal history of irradiation: Secondary | ICD-10-CM | POA: Diagnosis not present

## 2020-07-24 DIAGNOSIS — B1920 Unspecified viral hepatitis C without hepatic coma: Secondary | ICD-10-CM | POA: Diagnosis present

## 2020-07-24 DIAGNOSIS — R578 Other shock: Secondary | ICD-10-CM

## 2020-07-24 DIAGNOSIS — J9601 Acute respiratory failure with hypoxia: Secondary | ICD-10-CM | POA: Diagnosis not present

## 2020-07-24 DIAGNOSIS — Z560 Unemployment, unspecified: Secondary | ICD-10-CM | POA: Diagnosis not present

## 2020-07-24 DIAGNOSIS — K759 Inflammatory liver disease, unspecified: Secondary | ICD-10-CM | POA: Diagnosis present

## 2020-07-24 DIAGNOSIS — R5383 Other fatigue: Secondary | ICD-10-CM | POA: Diagnosis present

## 2020-07-24 DIAGNOSIS — R109 Unspecified abdominal pain: Secondary | ICD-10-CM | POA: Diagnosis present

## 2020-07-24 DIAGNOSIS — D62 Acute posthemorrhagic anemia: Secondary | ICD-10-CM

## 2020-07-24 DIAGNOSIS — Z515 Encounter for palliative care: Secondary | ICD-10-CM | POA: Diagnosis present

## 2020-07-24 DIAGNOSIS — R739 Hyperglycemia, unspecified: Secondary | ICD-10-CM | POA: Diagnosis present

## 2020-07-24 DIAGNOSIS — M109 Gout, unspecified: Secondary | ICD-10-CM | POA: Diagnosis present

## 2020-07-24 DIAGNOSIS — K661 Hemoperitoneum: Secondary | ICD-10-CM | POA: Diagnosis present

## 2020-07-24 DIAGNOSIS — Z7901 Long term (current) use of anticoagulants: Secondary | ICD-10-CM | POA: Diagnosis not present

## 2020-07-24 DIAGNOSIS — Z8249 Family history of ischemic heart disease and other diseases of the circulatory system: Secondary | ICD-10-CM | POA: Diagnosis not present

## 2020-07-24 DIAGNOSIS — C220 Liver cell carcinoma: Secondary | ICD-10-CM | POA: Diagnosis present

## 2020-07-24 DIAGNOSIS — R140 Abdominal distension (gaseous): Secondary | ICD-10-CM | POA: Diagnosis present

## 2020-07-24 DIAGNOSIS — Z9221 Personal history of antineoplastic chemotherapy: Secondary | ICD-10-CM | POA: Diagnosis not present

## 2020-07-24 DIAGNOSIS — R0602 Shortness of breath: Secondary | ICD-10-CM

## 2020-07-24 DIAGNOSIS — Z87891 Personal history of nicotine dependence: Secondary | ICD-10-CM | POA: Diagnosis not present

## 2020-07-24 DIAGNOSIS — Z809 Family history of malignant neoplasm, unspecified: Secondary | ICD-10-CM | POA: Diagnosis not present

## 2020-07-24 DIAGNOSIS — Z20822 Contact with and (suspected) exposure to covid-19: Secondary | ICD-10-CM | POA: Diagnosis present

## 2020-07-24 HISTORY — DX: Anemia, unspecified: D64.9

## 2020-07-24 HISTORY — DX: Other pulmonary embolism without acute cor pulmonale: I26.99

## 2020-07-24 LAB — CBC
HCT: 20.5 % — ABNORMAL LOW (ref 39.0–52.0)
HCT: 24.8 % — ABNORMAL LOW (ref 39.0–52.0)
HCT: 27.6 % — ABNORMAL LOW (ref 39.0–52.0)
Hemoglobin: 6.5 g/dL — CL (ref 13.0–17.0)
Hemoglobin: 8 g/dL — ABNORMAL LOW (ref 13.0–17.0)
Hemoglobin: 8.8 g/dL — ABNORMAL LOW (ref 13.0–17.0)
MCH: 30.2 pg (ref 26.0–34.0)
MCH: 29.6 pg (ref 26.0–34.0)
MCH: 29.2 pg (ref 26.0–34.0)
MCHC: 31.7 g/dL (ref 30.0–36.0)
MCHC: 32.3 g/dL (ref 30.0–36.0)
MCHC: 31.9 g/dL (ref 30.0–36.0)
MCV: 95.3 fL (ref 80.0–100.0)
MCV: 91.9 fL (ref 80.0–100.0)
MCV: 91.7 fL (ref 80.0–100.0)
Platelets: 241 10*3/uL (ref 150–400)
Platelets: 168 10*3/uL (ref 150–400)
Platelets: 179 10*3/uL (ref 150–400)
RBC: 2.15 MIL/uL — ABNORMAL LOW (ref 4.22–5.81)
RBC: 2.7 MIL/uL — ABNORMAL LOW (ref 4.22–5.81)
RBC: 3.01 MIL/uL — ABNORMAL LOW (ref 4.22–5.81)
RDW: 19.8 % — ABNORMAL HIGH (ref 11.5–15.5)
RDW: 18.6 % — ABNORMAL HIGH (ref 11.5–15.5)
RDW: 19.3 % — ABNORMAL HIGH (ref 11.5–15.5)
WBC: 8.7 10*3/uL (ref 4.0–10.5)
WBC: 6.2 10*3/uL (ref 4.0–10.5)
WBC: 7.2 10*3/uL (ref 4.0–10.5)
nRBC: 0 % (ref 0.0–0.2)
nRBC: 0 % (ref 0.0–0.2)
nRBC: 0 % (ref 0.0–0.2)

## 2020-07-24 LAB — LIPASE, BLOOD: Lipase: 39 U/L (ref 11–51)

## 2020-07-24 LAB — COMPREHENSIVE METABOLIC PANEL
ALT: 79 U/L — ABNORMAL HIGH (ref 0–44)
AST: 204 U/L — ABNORMAL HIGH (ref 15–41)
Albumin: 3.3 g/dL — ABNORMAL LOW (ref 3.5–5.0)
Alkaline Phosphatase: 282 U/L — ABNORMAL HIGH (ref 38–126)
Anion gap: 16 — ABNORMAL HIGH (ref 5–15)
BUN: 24 mg/dL — ABNORMAL HIGH (ref 8–23)
CO2: 21 mmol/L — ABNORMAL LOW (ref 22–32)
Calcium: 9.9 mg/dL (ref 8.9–10.3)
Chloride: 103 mmol/L (ref 98–111)
Creatinine, Ser: 3.03 mg/dL — ABNORMAL HIGH (ref 0.61–1.24)
GFR calc Af Amer: 25 mL/min — ABNORMAL LOW (ref >60)
GFR calc non Af Amer: 21 mL/min — ABNORMAL LOW (ref >60)
Glucose, Bld: 178 mg/dL — ABNORMAL HIGH (ref 70–99)
Potassium: 4.5 mmol/L (ref 3.5–5.1)
Sodium: 140 mmol/L (ref 135–145)
Total Bilirubin: 2.1 mg/dL — ABNORMAL HIGH (ref 0.3–1.2)
Total Protein: 8 g/dL (ref 6.5–8.1)

## 2020-07-24 LAB — CBC WITH DIFFERENTIAL/PLATELET
Abs Immature Granulocytes: 0.08 10*3/uL — ABNORMAL HIGH (ref 0.00–0.07)
Basophils Absolute: 0.1 10*3/uL (ref 0.0–0.1)
Basophils Relative: 1 %
Eosinophils Absolute: 0.1 10*3/uL (ref 0.0–0.5)
Eosinophils Relative: 1 %
HCT: 26.9 % — ABNORMAL LOW (ref 39.0–52.0)
Hemoglobin: 8.2 g/dL — ABNORMAL LOW (ref 13.0–17.0)
Immature Granulocytes: 1 %
Lymphocytes Relative: 16 %
Lymphs Abs: 1.5 10*3/uL (ref 0.7–4.0)
MCH: 29.5 pg (ref 26.0–34.0)
MCHC: 30.5 g/dL (ref 30.0–36.0)
MCV: 96.8 fL (ref 80.0–100.0)
Monocytes Absolute: 0.8 10*3/uL (ref 0.1–1.0)
Monocytes Relative: 9 %
Neutro Abs: 6.9 10*3/uL (ref 1.7–7.7)
Neutrophils Relative %: 72 %
Platelets: 260 10*3/uL (ref 150–400)
RBC: 2.78 MIL/uL — ABNORMAL LOW (ref 4.22–5.81)
RDW: 19.2 % — ABNORMAL HIGH (ref 11.5–15.5)
WBC: 9.5 10*3/uL (ref 4.0–10.5)
nRBC: 0 % (ref 0.0–0.2)

## 2020-07-24 LAB — SARS CORONAVIRUS 2 BY RT PCR (HOSPITAL ORDER, PERFORMED IN ~~LOC~~ HOSPITAL LAB): SARS Coronavirus 2: NEGATIVE

## 2020-07-24 LAB — ABO/RH: ABO/RH(D): O POS

## 2020-07-24 LAB — MRSA PCR SCREENING: MRSA by PCR: NEGATIVE

## 2020-07-24 LAB — PREPARE RBC (CROSSMATCH)

## 2020-07-24 LAB — APTT: aPTT: 37 seconds — ABNORMAL HIGH (ref 24–36)

## 2020-07-24 LAB — PROTIME-INR
INR: 1.5 — ABNORMAL HIGH (ref 0.8–1.2)
Prothrombin Time: 17.5 seconds — ABNORMAL HIGH (ref 11.4–15.2)

## 2020-07-24 LAB — HEPARIN LEVEL (UNFRACTIONATED): Heparin Unfractionated: >2.2 IU/mL — ABNORMAL HIGH (ref 0.30–0.70)

## 2020-07-24 LAB — LACTIC ACID, PLASMA: Lactic Acid, Venous: 3 mmol/L (ref 0.5–1.9)

## 2020-07-24 MED ORDER — SODIUM CHLORIDE (PF) 0.9 % IJ SOLN
INTRAMUSCULAR | Status: AC
  Filled 2020-07-24: qty 50

## 2020-07-24 MED ORDER — SODIUM CHLORIDE 0.9 % IV SOLN
10.0000 mL/h | Freq: Once | INTRAVENOUS | Status: AC
  Administered 2020-07-24: 10 mL/h via INTRAVENOUS

## 2020-07-24 MED ORDER — ORAL CARE MOUTH RINSE
15.0000 mL | Freq: Two times a day (BID) | OROMUCOSAL | Status: DC
  Administered 2020-07-24 – 2020-07-30 (×12): 15 mL via OROMUCOSAL

## 2020-07-24 MED ORDER — CHLORHEXIDINE GLUCONATE CLOTH 2 % EX PADS
6.0000 | MEDICATED_PAD | Freq: Every day | CUTANEOUS | Status: DC
  Administered 2020-07-24 – 2020-07-29 (×6): 6 via TOPICAL

## 2020-07-24 MED ORDER — SODIUM CHLORIDE 0.9 % IV SOLN: 10.0000 mL/h | Freq: Once | INTRAVENOUS | Status: DC

## 2020-07-24 MED ORDER — SODIUM CHLORIDE 0.9 % IV BOLUS
1000.0000 mL | Freq: Once | INTRAVENOUS | Status: AC
  Administered 2020-07-24: 1000 mL via INTRAVENOUS

## 2020-07-24 MED ORDER — HYDRALAZINE HCL 20 MG/ML IJ SOLN: 10.0000 mg | INTRAMUSCULAR | Status: DC | PRN

## 2020-07-24 MED ORDER — POLYETHYLENE GLYCOL 3350 17 G PO PACK: 17.0000 g | PACK | Freq: Every day | ORAL | Status: DC | PRN

## 2020-07-24 MED ORDER — PROTHROMBIN COMPLEX CONC HUMAN 500 UNITS IV KIT
3309.0000 [IU] | PACK | Status: AC
  Administered 2020-07-24: 3309 [IU] via INTRAVENOUS
  Filled 2020-07-24: qty 3309

## 2020-07-24 MED ORDER — OXYCODONE HCL 5 MG PO TABS
5.0000 mg | ORAL_TABLET | Freq: Four times a day (QID) | ORAL | Status: DC | PRN
  Administered 2020-07-24 – 2020-07-25 (×3): 5 mg via ORAL
  Filled 2020-07-24 (×3): qty 1

## 2020-07-24 MED ORDER — HYDROMORPHONE HCL 1 MG/ML IJ SOLN
0.5000 mg | Freq: Once | INTRAMUSCULAR | Status: AC
  Administered 2020-07-24: 0.5 mg via INTRAVENOUS
  Filled 2020-07-24: qty 1

## 2020-07-24 MED ORDER — DOCUSATE SODIUM 100 MG PO CAPS
100.0000 mg | ORAL_CAPSULE | Freq: Two times a day (BID) | ORAL | Status: DC | PRN
  Administered 2020-07-29: 100 mg via ORAL
  Filled 2020-07-24: qty 1

## 2020-07-24 MED ORDER — ONDANSETRON HCL 4 MG/2ML IJ SOLN: 4.0000 mg | Freq: Four times a day (QID) | INTRAMUSCULAR | Status: DC | PRN

## 2020-07-24 NOTE — Plan of Care (Signed)
This RN will continue to monitor patient's progression of care plan.  

## 2020-07-24 NOTE — ED Notes (Signed)
Patient unable to urinate at this time. 

## 2020-07-24 NOTE — Progress Notes (Signed)
Pharmacist Chemotherapy Monitoring - Initial Assessment    Anticipated start date: 07/29/20   Regimen:  . Are orders appropriate based on the patient's diagnosis, regimen, and cycle? Yes . Does the plan date match the patient's scheduled date? Yes . Is the sequencing of drugs appropriate? Yes . Are the premedications appropriate for the patient's regimen? Yes . Prior Authorization for treatment is: Approved o If applicable, is the correct biosimilar selected based on the patient's insurance? yes  Organ Function and Labs: Marland Kitchen Are dose adjustments needed based on the patient's renal function, hepatic function, or hematologic function? Yes . Are appropriate labs ordered prior to the start of patient's treatment? Yes . Other organ system assessment, if indicated: N/A . The following baseline labs, if indicated, have been ordered: atezolizumab: baseline TSH +/- T4 and bevacizumab: urine protein  Dose Assessment: . Are the drug doses appropriate? Yes . Are the following correct: o Drug concentrations Yes o IV fluid compatible with drug Yes o Administration routes Yes o Timing of therapy Yes . If applicable, does the patient have documented access for treatment and/or plans for port-a-cath placement? not applicable . If applicable, have lifetime cumulative doses been properly documented and assessed? no Lifetime Dose Tracking  No doses have been documented on this patient for the following tracked chemicals: Doxorubicin, Epirubicin, Idarubicin, Daunorubicin, Mitoxantrone, Bleomycin, Oxaliplatin, Carboplatin, Liposomal Doxorubicin  o   Toxicity Monitoring/Prevention: . The patient has the following take home antiemetics prescribed: Ondansetron, Prochlorperazine and Lorazepam . The patient has the following take home medications prescribed: N/A . Medication allergies and previous infusion related reactions, if applicable, have been reviewed and addressed. No . The patient's current medication  list has been assessed for drug-drug interactions with their chemotherapy regimen. no significant drug-drug interactions were identified on review.  Order Review: . Are the treatment plan orders signed? Yes . Is the patient scheduled to see a provider prior to their treatment? Yes  I verify that I have reviewed each item in the above checklist and answered each question accordingly.  Randeep Biondolillo D 07/24/2020 10:49 AM

## 2020-07-24 NOTE — ED Notes (Addendum)
Will collect CBC after blood transfusion.

## 2020-07-24 NOTE — H&P (Addendum)
NAME:  Vernon [name], MRN:  [id number], DOB:  1961-05-17, LOS: 0 ADMISSION DATE:  07/24/2020, CONSULTATION DATE: 8/25 REFERRING MD:  Dr. Ronnald Nian, CHIEF COMPLAINT: Abdominal Pain    Brief History   59 y/o M with hepatocellular carcinoma, acute on chronic PE on Eliquis with recent increase due to new PE who presented 8/25 with abdominal pain.  CT Abd with new abdominal hemorrhage.     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    On 8/20 he was seen in the ER for worsening shortness of breath.  He was found at that time to have an acute PE.  Eliquis was increased to 10 mg BID.    The patient reported again to the ER 8/25 with abdominal pain on presentation.  The patient refused COVID screening in the ER.  Initial labs - Na 140, K4.5, Cl 103, CO2 21, glucose 178, BUN 24 / Sr Cr 3.03, AG 16, alk phos 282, albumin 3.3, lactic acid 3, AST 204, ALT 79, WBC 8.7, Hgb 6.5, platelets 241.  CT ABD/Pelvis assessed which showed interval development of high density fluid in the pelvis consistent with hemorrhage, complex hyperdense abnormality anteriorly in the central portion of the abdomen consistent with intraperitoneal hematoma or hemorrhage.  Multiple rounded hypoechoic lesions noted throughout the liver consistent with metastatic disease.  Large lytic destructive lesion in the left sacrum, L1, L4 vertebral bodies consistent with metastatic disease.  L4 lesion appears to extend into the right paraspinal soft tissues.    Emergency release blood was ordered in the ER.  He was initially mildly hypotensive with SBP of 83. After transfusion, BP improved to low 100's.  IR consulted for possible embolization.    PCCM called for admission.    Past Medical History  Hepatocellular Carcinoma with Metastatic Bone Disease, Stage IV Gout  Hepatitis  C Pulmonary Embolism Anemia   Significant Hospital Events   8/25 Admit   Consults:    Procedures:    Significant Diagnostic Tests:  CT ABD/Pelvis 8/25 >> interval development of high density fluid in the pelvis consistent with hemorrhage, complex hyperdense abnormality anteriorly in the central portion of the abdomen consistent with intraperitoneal hematoma or hemorrhage.  Multiple rounded hypoechoic lesions noted throughout the liver consistent with metastatic disease.  Large lytic destructive lesion in the left sacrum, L1, L4 vertebral bodies consistent with metastatic disease.  L4 lesion appears to extend into the right paraspinal soft tissues.    Micro Data:  COVID 8/25 >>  BCx2 8/25 >>   Antimicrobials:     Interim history/subjective:  Pt reports feeling better after blood.  Girlfriend at bedside  VSS / never on pressors Afebrile   Objective   Blood pressure 115/68, pulse 89, temperature 98.7 F (37.1 C), temperature source Oral, resp. rate 15, height _0  (1.702 m), weight 67.9 kg, SpO2 94 %.       No intake or output data in the 24 hours ending 07/24/20 1143 Filed Weights   07/23/20 2151  Weight: 67.9 kg    Examination: General: chronically ill appearing adult male lying in bed in NAD HEENT: MM pink/moist, mild icterus Neuro: AAOx4, speech clear, MAE  CV: s1s2 rrr, no m/r/g PULM: non-labored on RA, lungs bilaterally clear  GI: soft, bsx4 active, mild tenderness to palpation  Extremities: warm/dry, no  edema  Skin: no rashes or lesions  Resolved Hospital Problem list      Assessment & Plan:   Intraabdominal Hemorrhage  On Eliquis for PE, after IR review > suspect likely related to [facility] -appreciate IR assistance -NPO for planned embolization this afternoon  -hold all anticoagulation  -monitor CBC Q6 -Transfuse 3 units blood as ordered, follow up CBC post transfusion  -admit to ICU overnight for hemodynamic monitoring   Coagulopathy on Eliquis   -hold eliquis -s/p kCentra in ER -PRBC x3 with FFP ordered  Acute Blood Loss Anemia  -trend CBC as above -volume resuscitation with PRBC -appreciate IR  AKI  -Trend BMP / urinary output -Replace electrolytes as indicated -Avoid nephrotoxic agents, ensure adequate renal perfusion  Metastatic Hepatocellular Carcinoma  Initial diagnosis in 06/2018, found to have bone metastasis in 03/2020.  S/p radiation therapy, on chemotherapy (Lenvatinib) but has only been taking intermittently due to nausea.  -supportive care -appreciate Dr. Ernestina Penna input   Hyperglycemia  -monitor with liver dysfunction -if consistently >180 add SSI   Chronic Pain  -PRN oxycodone  -hold home neurontin 8/25  [place] Reviewed patients condition with patient and girlfriend at bedside. He indicates his daughter "has all his paperwork" and would make decisions if needed.  We reviewed proceeding forward with correcting all reversible causes of illness. He is in agreement.  Also discussed emergency events like CPR and recommended no CPR in the event of arrest.  Would be reasonable to provide short term ventilation if needed for procedure.  He initially agreed but then stated he wanted to think about things since he has never considered it before.   Best practice:  Diet: NPO  Pain/Anxiety/Delirium protocol (if indicated): n/a  VAP protocol (if indicated): n/a  DVT prophylaxis: SCD's  GI prophylaxis: n/a  Glucose control: n/a Mobility: as tolerated  Code Status: Full Code, see discussion above  Family Communication: Patient and girlfriend updated at bedside 8/25.  Disposition: ICU, to [place] as of 8/26 am   Labs   CBC: Recent Labs  Lab 07/19/20 0651 07/24/20 0709 07/24/20 0854  WBC 6.2 9.5 8.7  NEUTROABS 3.5 6.9  --   HGB 10.2* 8.2* 6.5*  HCT 29.4* 26.9* 20.5*  MCV 88.0 96.8 95.3  PLT 218 260 185    Basic Metabolic Panel: Recent Labs  Lab 07/19/20 0651 07/24/20 0709  NA 137 140  K 4.0 4.5  CL 104 103   CO2 22 21*  GLUCOSE 99 178*  BUN 14 24*  CREATININE 1.21 3.03*  CALCIUM 9.4 9.9   GFR: Estimated Creatinine Clearance: 23.9 mL/min (A) (by C-G formula based on SCr of 3.03 mg/dL (H)). Recent Labs  Lab 07/19/20 0651 07/24/20 0709 07/24/20 0810 07/24/20 0854  WBC 6.2 9.5  --  8.7  LATICACIDVEN  --   --  3.0*  --     Liver Function Tests: Recent Labs  Lab 07/19/20 0651 07/24/20 0709  AST 183* 204*  ALT 71* 79*  ALKPHOS 285* 282*  BILITOT 1.8* 2.1*  PROT 8.0 8.0  ALBUMIN 3.2* 3.3*   Recent Labs  Lab 07/24/20 0709  LIPASE 39   No results for input(s): AMMONIA in the last 168 hours.  ABG No results found for: PHART, PCO2ART, PO2ART, HCO3, TCO2, ACIDBASEDEF, O2SAT   Coagulation Profile: Recent Labs  Lab 07/24/20 0810  INR 1.5*    Cardiac Enzymes: No results for input(s): CKTOTAL, CKMB, CKMBINDEX, TROPONINI in the last 168 hours.  HbA1C: Hgb A1c MFr Bld  Date/Time Value Ref Range Status  06/13/2018 11:39 AM 4.9 <5.7 % of total Hgb Final    Comment:    For the purpose of screening for the presence of diabetes: . <5.7%       Consistent with the absence of diabetes 5.7-6.4%    Consistent with increased risk for diabetes             (prediabetes) > or =6.5%  Consistent with diabetes . This assay result is consistent with a decreased risk of diabetes. . Currently, no consensus exists regarding use of hemoglobin A1c for diagnosis of diabetes in children. . According to American Diabetes Association (ADA) guidelines, hemoglobin A1c <7.0% represents optimal control in non-pregnant diabetic patients. Different metrics may apply to specific patient populations.  Standards of Medical Care in Diabetes(ADA). .     CBG: No results for input(s): GLUCAP in the last 168 hours.  Review of Systems: Positives in [place]   Gen: Denies fever, chills, weight change, fatigue, night sweats HEENT: Denies blurred vision, double vision, hearing loss, tinnitus, sinus  congestion, rhinorrhea, sore throat, neck stiffness, dysphagia PULM: Denies shortness of breath, cough, sputum production, hemoptysis, wheezing CV: Denies chest pain, edema, orthopnea, paroxysmal nocturnal dyspnea, palpitations GI: Denies abdominal pain, nausea, vomiting, diarrhea, hematochezia, melena, constipation, change in bowel habits GU: Denies dysuria, hematuria, polyuria, oliguria, urethral discharge Endocrine: Denies hot or cold intolerance, polyuria, polyphagia or appetite change Derm: Denies rash, dry skin, scaling or peeling skin change Heme: Denies easy bruising, bleeding, bleeding gums Neuro: Denies headache, numbness, weakness, slurred speech, loss of memory or consciousness  Past Medical History  He,  has a past medical history of Gout, Hepatitis C, and Hepatocellular carcinoma ([place]).   Surgical History   History reviewed. No pertinent surgical history.   Social History   reports that he quit smoking about 22 months ago. His smoking use included cigarettes. He quit after 18.00 years of use. He has never used smokeless tobacco. He reports current alcohol use. He reports that he does not use drugs.   Family History   His family history includes Cancer in his father and mother; Healthy in his daughter and son; Hypertension in his maternal uncle.   Allergies Allergies  Allergen Reactions  . Ms Contin Cleone Slim Sulfate Er] Nausea And Vomiting     Home Medications  Prior to Admission medications   Medication Sig Start Date End Date Taking? Authorizing Provider  albuterol (VENTOLIN HFA) 108 (90 Base) MCG/ACT inhaler Take 90 mcg by mouth every 4 (four) hours as needed.   Yes [provider]  apixaban (ELIQUIS) 5 MG TABS tablet Take 2 tablets ($RemoveBe'10mg'huTogfFFq$ ) twice daily for 7 days, then 1 tablet ($RemoveB'5mg'dxqoGnUG$ ) twice daily 07/19/20  Yes Truitt Merle, MD  gabapentin (NEURONTIN) 300 MG capsule Take 1 capsule (300 mg total) by mouth 3 (three) times daily. 05/31/20  Yes Truitt Merle, MD   ondansetron (ZOFRAN) 8 MG tablet Take 1 tablet (8 mg total) by mouth every 8 (eight) hours as needed for nausea or vomiting. 06/26/20  Yes Truitt Merle, MD  oxyCODONE ER [facility] ER) 13.5 MG C12A Take 13.5 mg by mouth every 12 (twelve) hours. 07/11/20  Yes Alla Feeling, NP  Oxycodone HCl 10 MG TABS Take 1 tablet (10 mg total) by mouth every 6 (six) hours as needed. OK to refill on 05/18/2020 07/22/20  Yes Truitt Merle, MD  polyethylene glycol (MIRALAX / GLYCOLAX) 17 g packet Take 17 g by mouth daily as needed for mild constipation.  04/26/20  Yes Amin, Jeanella Flattery, MD  prochlorperazine (COMPAZINE) 10 MG tablet Take 1 tablet (10 mg total) by mouth every 6 (six) hours as needed for nausea or vomiting. 06/21/20  Yes Truitt Merle, MD  dronabinol (MARINOL) 2.5 MG capsule Take 1 capsule (2.5 mg total) by mouth 2 (two) times daily before a meal. Patient not taking: Reported on 07/24/2020 06/21/20   Truitt Merle, MD  Lenvatinib 12 mg daily dose (LENVIMA) 3 x 4 MG capsule Take 12 mg by mouth daily. Start at 56m daily for first week, then increase to 819mdaily for second week and 127maily for third week if tolerates well Patient not taking: Reported on 06/21/2020 05/27/20   FenTruitt MerleD     Critical care time: 33 [id number]      BraNoe GensSN, NP-C [facility] 07/24/2020, 11:43 AM   Please see [email or web address] for pager details.

## 2020-07-24 NOTE — ED Provider Notes (Signed)
[facility] DEPT Provider Note   CSN: [id number] Arrival date & time: 07/23/20  2121     History No chief complaint on file.   Vernon [name] is a 59 y.o. male.  HPI Patient is a 59 year old male with a history of metastatic liver cancer, pulmonary embolism anticoagulated on Eliquis, hepatitis C, who presents due to two days of worsening abdominal pain. Pt states that his pain started as a dull pain that has been progressively worsening. He reports pain diffusely across his abdomen that worsens with palpation as well as deep breathing. He takes 10-325 oxycodone at home which he has been taking with minimal relief. He was seen on 8/20 and dx with a PE. His Eliquis was increased to 10 mg twice a day which she states he has been compliant with. He denies fevers, chills, URI sx, CP, n/v/d/c, urinary changes, syncope.      Past Medical History:  Diagnosis Date  . Gout   . Hepatitis C   . Hepatocellular carcinoma [facility])     Patient Active Problem List   Diagnosis Date Noted  . Bone metastasis ([place]) 04/30/2020  . Iron deficiency anemia due to chronic blood loss 04/26/2020  . Palliative care by specialist   . Right hip pain   . Pain of metastatic malignancy   . Pulmonary embolism without acute cor pulmonale ([place]) 04/25/2020  . 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 Right thigh pain 04/25/2020  . Anemia 04/25/2020  . Acute pulmonary embolism without acute cor pulmonale ([place]) 04/25/2020  . Fatigue 04/25/2020  . Goals of care, counseling/discussion 10/11/2018  . Hepatitis 10/11/2018  . Hepatitis C virus infection without hepatic coma 08/12/2018  . Hepatocellular carcinoma ([place]) 07/29/2018  . Abnormal iron saturation 07/29/2018    History reviewed. No pertinent surgical history.     Family History  Problem Relation Age of Onset  . Cancer Mother   . Cancer Father   . Hypertension Maternal  Uncle   . Healthy Daughter   . Healthy Son     Social History   Tobacco Use  . Smoking status: Former Smoker    Years: 18.00    Types: Cigarettes    Quit date: 08/25/2018    Years since quitting: 1.9  . Smokeless tobacco: Never Used  . Tobacco comment: smokes about 3 cigarretes a month  Vaping Use  . Vaping Use: Never used  Substance Use Topics  . Alcohol use: Yes    Comment: occasional beer  . Drug use: Never    Home Medications Prior to Admission medications   Medication Sig Start Date End Date Taking? Authorizing Provider  albuterol (VENTOLIN HFA) 108 (90 Base) MCG/ACT inhaler Take 90 mcg by mouth every 4 (four) hours as needed.    [provider]  apixaban (ELIQUIS) 5 MG TABS tablet Take 2 tablets (10mg ) twice daily for 7 days, then 1 tablet (5mg ) twice daily 07/19/20   Truitt Merle, MD  dronabinol (MARINOL) 2.5 MG capsule Take 1 capsule (2.5 mg total) by mouth 2 (two) times daily before a meal. 06/21/20   Truitt Merle, MD  gabapentin (NEURONTIN) 300 MG capsule Take 1 capsule (300 mg total) by mouth 3 (three) times daily. 05/31/20   Truitt Merle, MD  Lenvatinib 12 mg daily dose (LENVIMA) 3 x 4 MG capsule Take 12 mg by mouth daily. Start at 4mg  daily for first week, then increase to 8mg  daily for second week and 12mg   daily for third week if tolerates well Patient not taking: Reported on 06/21/2020 05/27/20   Truitt Merle, MD  ondansetron [facility]) 8 MG tablet Take 1 tablet (8 mg total) by mouth every 8 (eight) hours as needed for nausea or vomiting. 06/26/20   Truitt Merle, MD  oxyCODONE ER [facility]) 13.5 MG C12A Take 13.5 mg by mouth every 12 (twelve) hours. 07/11/20   Alla Feeling, NP  Oxycodone HCl 10 MG TABS Take 1 tablet (10 mg total) by mouth every 6 (six) hours as needed. OK to refill on 05/18/2020 07/22/20   Truitt Merle, MD  polyethylene glycol (MIRALAX / GLYCOLAX) 17 g packet Take 17 g by mouth daily as needed for mild constipation. 04/26/20   Damita Lack, MD  polyethylene  glycol powder (GLYCOLAX/MIRALAX) 17 GM/SCOOP powder SMARTSIG:1 Capful(s) By Mouth Daily PRN 04/26/20   [provider]  prochlorperazine (COMPAZINE) 10 MG tablet Take 1 tablet (10 mg total) by mouth every 6 (six) hours as needed for nausea or vomiting. 06/21/20   Truitt Merle, MD    Allergies    Ms contin [morphine sulfate er]  Review of Systems   Review of Systems  All other systems reviewed and are negative. Ten systems reviewed and are negative for acute change, except as noted in the HPI.   Physical Exam Updated Vital Signs BP 129/76 (BP Location: Left Arm)   Pulse (!) 108   Temp 98.5 F (36.9 C) (Oral)   Resp 16   Ht 5\' 7"  (1.702 m)   Wt 67.9 kg   SpO2 95%   BMI 23.46 kg/m   Physical Exam Vitals and nursing note reviewed.  Constitutional:      General: He is in acute distress.     Appearance: Normal appearance. He is not ill-appearing, toxic-appearing or diaphoretic.  HENT:     Head: Normocephalic and atraumatic.     Right Ear: External ear normal.     Left Ear: External ear normal.     Nose: Nose normal.     Mouth/Throat:     Mouth: Mucous membranes are moist.     Pharynx: Oropharynx is clear. No oropharyngeal exudate or posterior oropharyngeal erythema.  Eyes:     Extraocular Movements: Extraocular movements intact.  Cardiovascular:     Rate and Rhythm: Normal rate and regular rhythm.     Pulses: Normal pulses.     Heart sounds: Normal heart sounds. No murmur heard.  No friction rub. No gallop.   Pulmonary:     Effort: Pulmonary effort is normal. No respiratory distress.     Breath sounds: Normal breath sounds. No stridor. No wheezing, rhonchi or rales.     Comments: LCTAB though difficult to assess due to increased pain with deep breathing. Saturating at 100% on RA.  Abdominal:     General: Abdomen is flat. Bowel sounds are normal. There is no distension.     Palpations: Abdomen is soft.     Tenderness: There is abdominal tenderness. There is no rebound.      Comments: Diffuse TTP noted across the abdomen with light palpation. No distension.   Musculoskeletal:        General: No tenderness. Normal range of motion.     Cervical back: Normal range of motion and neck supple. No tenderness.     Right lower leg: No edema.     Left lower leg: No edema.     Comments: No calf tenderness. No leg swelling.   Skin:  General: Skin is warm and dry.  Neurological:     General: No focal deficit present.     Mental Status: He is alert and oriented to person, place, and time.  Psychiatric:        Mood and Affect: Mood normal.        Behavior: Behavior normal.    ED Results / Procedures / Treatments   Labs (all labs ordered are listed, but only abnormal results are displayed) Labs Reviewed  CBC WITH DIFFERENTIAL/PLATELET - Abnormal; Notable for the following components:      Result Value   RBC 2.78 (*)    Hemoglobin 8.2 (*)    HCT 26.9 (*)    RDW 19.2 (*)    Abs Immature Granulocytes 0.08 (*)    All other components within normal limits  COMPREHENSIVE METABOLIC PANEL - Abnormal; Notable for the following components:   CO2 21 (*)    Glucose, Bld 178 (*)    BUN 24 (*)    Creatinine, Ser 3.03 (*)    Albumin 3.3 (*)    AST 204 (*)    ALT 79 (*)    Alkaline Phosphatase 282 (*)    Total Bilirubin 2.1 (*)    GFR calc non Af Amer 21 (*)    GFR calc Af Amer 25 (*)    Anion gap 16 (*)    All other components within normal limits  PROTIME-INR - Abnormal; Notable for the following components:   Prothrombin Time 17.5 (*)    INR 1.5 (*)    All other components within normal limits  LACTIC ACID, PLASMA - Abnormal; Notable for the following components:   Lactic Acid, Venous 3.0 (*)    All other components within normal limits  CBC - Abnormal; Notable for the following components:   RBC 2.15 (*)    Hemoglobin 6.5 (*)    HCT 20.5 (*)    RDW 19.8 (*)    All other components within normal limits  HEPARIN LEVEL (UNFRACTIONATED) - Abnormal; Notable  for the following components:   Heparin Unfractionated >2.20 (*)    All other components within normal limits  APTT - Abnormal; Notable for the following components:   aPTT 37 (*)    All other components within normal limits  CULTURE, BLOOD (ROUTINE X 2)  CULTURE, BLOOD (ROUTINE X 2)  SARS CORONAVIRUS 2 BY RT PCR (HOSPITAL ORDER, [place] [facility])  LIPASE, BLOOD  URINALYSIS, ROUTINE W REFLEX MICROSCOPIC  LACTIC ACID, PLASMA  CBC  TYPE AND SCREEN  PREPARE RBC (CROSSMATCH)  PREPARE FRESH FROZEN PLASMA  PREPARE RBC (CROSSMATCH)  ABO/RH   EKG EKG Interpretation  Date/Time:  Wednesday July 24 2020 08:42:22 EDT Ventricular Rate:  96 PR Interval:    QRS Duration: 84 QT Interval:  361 QTC Calculation: 457 R Axis:   11 Text Interpretation: Sinus rhythm LVH by voltage Inferior infarct, age indeterminate Confirmed by Lennice Sites [id number]) on 07/24/2020 8:45:43 AM  Radiology CT ABDOMEN PELVIS WO CONTRAST  Result Date: 07/24/2020 CLINICAL DATA:  Acute generalized abdominal pain and distention. History of hepatocellular carcinoma. EXAM: CT ABDOMEN AND PELVIS WITHOUT CONTRAST TECHNIQUE: Multidetector CT imaging of the abdomen and pelvis was performed following the standard protocol without IV contrast. COMPARISON:  July 18, 2020. FINDINGS: Lower chest: Mild bibasilar subsegmental atelectasis is noted. Hepatobiliary: No gallstones or biliary dilatation is noted. Multiple rounded hypoechoic lesions are noted throughout the liver consistent with metastatic disease. Pancreas: Unremarkable. No pancreatic ductal dilatation  or surrounding inflammatory changes. Spleen: Normal in size without focal abnormality. Adrenals/Urinary Tract: Adrenal glands are unremarkable. Kidneys are normal, without renal calculi, focal lesion, or hydronephrosis. Bladder is unremarkable. Stomach/Bowel: The stomach is unremarkable. There is no evidence of bowel obstruction or inflammation the appendix  appears normal. Vascular/Lymphatic: Aortic atherosclerosis. No enlarged abdominal or pelvic lymph nodes. Reproductive: Prostate is unremarkable. Other: There is the interval development of high density fluid in the pelvis most consistent with hemorrhage. There also appears to be complex hyperdense abnormality seen anteriorly in the central portion of the abdomen, also consistent with intraperitoneal hematoma or hemorrhage. No hernia is noted. Hemorrhage is also noted inferior to the right hepatic lobe. Musculoskeletal: Large lytic destructive lesions are noted in the left sacrum, L1 and L4 vertebral bodies. The L4 lesion appears to extend into the right paraspinal soft tissues. IMPRESSION: 1. Interval development of high density fluid in the pelvis most consistent with hemorrhage. There also appears to be complex hyperdense abnormality seen anteriorly in the central portion of the abdomen, also consistent with intraperitoneal hematoma or hemorrhage. Hemorrhage is also noted inferior to the right hepatic lobe. Critical Value/emergent results were called by telephone at the time of interpretation on 07/24/2020 at 8:46 am to provider ADAM CURATOLO , who verbally acknowledged these results. 2. Multiple rounded hypoechoic lesions are noted throughout the liver consistent with metastatic disease. 3. Large lytic destructive lesions are noted in the left sacrum, L1 and L4 vertebral bodies consistent with metastatic disease. The L4 lesion appears to extend into the right paraspinal soft tissues. 4. Aortic atherosclerosis. Aortic Atherosclerosis (ICD10-I70.0). Electronically Signed   By: Marijo Conception M.D.   On: 07/24/2020 08:46    Procedures Procedures (including critical care time)  Medications Ordered in ED Medications  sodium chloride (PF) 0.9 % injection (has no administration in time range)  0.9 %  sodium chloride infusion (0 mL/hr Intravenous Hold 07/24/20 1005)  0.9 %  sodium chloride infusion (0 mL/hr  Intravenous Hold 07/24/20 1005)  HYDROmorphone (DILAUDID) injection 0.5 mg (0.5 mg Intravenous Given 07/24/20 0710)  sodium chloride 0.9 % bolus 1,000 mL (1,000 mLs Intravenous New Bag/Given 07/24/20 0816)  0.9 %  sodium chloride infusion (10 mL/hr Intravenous New Bag/Given 07/24/20 1111)  prothrombin complex conc human (KCENTRA) IVPB 3,309 Units (0 Units Intravenous Stopped 07/24/20 1216)    ED Course  I have reviewed the triage vital signs and the nursing notes.  Pertinent labs & imaging results that were available during my care of the patient were reviewed by me and considered in my medical decision making (see chart for details).  Clinical Course as of Jul 24 1220  Wed Jul 24, 2020  0808 1.21, 5 days ago  Creatinine(!): 3.03 [LJ]  0835 Given fluid bolus.   Anion gap(!): 16 [LJ]  0856 Down from 10.2, 5 days ago  Hemoglobin(!): 8.2 [LJ]  0857 1. Interval development of high density fluid in the pelvis most consistent with hemorrhage. There also appears to be complex hyperdense abnormality seen anteriorly in the central portion of the abdomen, also consistent with intraperitoneal hematoma or hemorrhage. Hemorrhage is also noted inferior to the right hepatic lobe. Critical Value/emergent results were called by telephone at the time of interpretation on 07/24/2020 at 8:46 am to provider ADAM CURATOLO , who verbally acknowledged these results. 2. Multiple rounded hypoechoic lesions are noted throughout the liver consistent with metastatic disease. 3. Large lytic destructive lesions are noted in the left sacrum, L1 and L4 vertebral bodies  consistent with metastatic disease. The L4 lesion appears to extend into the right paraspinal soft tissues. 4. Aortic atherosclerosis.  CT ABDOMEN PELVIS WO CONTRAST [LJ]  0908 I spoke to Dr. Burr Medico with Oncology.  She states the patient was initially taking oral chemotherapy but did not tolerate this well.  She thinks that he will be amenable with trying IV  chemotherapy.  Recommends holding his Xarelto. She understands the patient will be admitted to the hospital and she is planning on rounding on the patient.   [LJ]  [id number] Discussed with attending physician. Will give 2 units of PRBC and FFP. Discussed with pharmacy who recommends Kcentra. His last dose of Eliquis was about 15 hours ago.  Hemoglobin(!!): 6.5 [LJ]  0954 I spoke to Dr. Anselm Pancoast with interventional radiology.  Due to his creatinine he feels that it is best to admit the patient and continue to track his CBC.  Hospitalist can consult IR to see patient.    [LJ]  59 My attending discussed with intensivist who will admit the patient.    [LJ]  1027 Prothrombin Time(!): 17.5 [LJ]  1027 INR(!): 1.5 [LJ]  1027 Lactic Acid, Venous(!!): 3.0 [LJ]  1159 Interventional radiology call me back.  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  He is considering possible embolization this afternoon but is going to discuss with intensivist and evaluate the patient as well.   [LJ]    Clinical Course User Index [LJ] Rayna Sexton, PA-C   MDM Rules/Calculators/A&P                          Pt is a 59 y.o. male that presents with a history, physical exam, and ED Clinical Course as noted above.   Pt has a history of metastatic liver CA who was recently dx with PE and had his Eliquis increased to 10mg  BID. He presented today with worsening abdominal pain.  CT scan was obtained showing what appears to be an intraperitoneal hemorrhage as well as additional bleeding within the pelvis and below the right hepatic lobe. I spoke to IR who believes this is likely a ruptured [facility] and are planning on evaluating the patient.   Initial hemoglobin at 8.2, down from 10.2, 5 days ago.  Patient additionally noted to have a creatinine of 3.03 which is up from 1.21, 5 days ago.  Patient was given a 1 L bolus and CBC was rechecked showing a hemoglobin of 6.5.  Patient started  on PRBCs as well as FFP.  Patient was discussed with and evaluated by my attending physician Dr. Lennice Sites.  Patient was discussed with pharmacy who recommended that we use Kcentra for Eliquis reversal. Since receiving his first unit his blood pressure is now improved to about 115/68.  His tachycardia is improved to around 89.  Patient is lying comfortably in bed.  My attending physician discussed with the intensivist who will accept this patient to the ICU.  COVID-19 test has been ordered.  We will continue to closely monitor.  Note: Portions of this report may have been transcribed using voice recognition software. Every effort was made to ensure accuracy; however, inadvertent computerized transcription errors may be present.    Final Clinical Impression(s) / ED Diagnoses Final diagnoses:  Intraperitoneal hemorrhage  AKI (acute kidney injury) ([place])   Rx / DC Orders ED Discharge Orders    None       Vestal Markin,  Rolla Plate, PA-C 07/24/20 1222    Lennice Sites, DO 07/24/20 1241

## 2020-07-24 NOTE — Progress Notes (Signed)
[facility] Progress Note Patient Name: BRYAR DAHMS DOB: 01-21-1961 MRN: [id number]   Date of Service  07/24/2020  HPI/Events of Note  Hypertension - BP = 167/78.  eICU Interventions  Plan: 1. Hydralazine 10 mg iV Q 4 hours PRN SBP > 170 or DBP > 100.     Intervention Category Major Interventions: Hypertension - evaluation and management  Shahrzad Koble Cornelia Copa 07/24/2020, 10:33 PM

## 2020-07-24 NOTE — ED Notes (Signed)
Patient refused COVID-19 test.  

## 2020-07-24 NOTE — Progress Notes (Signed)
Vernon [name]   DOB:[birth year]   DU#:[id number]   CBJ#:[id number]  Oncology follow up  Subjective: Patient is well-known to me, under my care for his metastatic liver cancer.  He was found to have recurrent PE on recent restaging CT scan, and I increased his Eliquis dose to 10mg  mg twice daily.  He presented to [facility] emergency room for severe abdominal pain, and a CT scan showed probable abdominal hemorrhage.  His hemoglobin was 8.2 (droped to 6.5 a few hours later) this morning in the ED, dropped from 10.2 last week.  Xarelto was held.  Patient was getting blood transfusion when I saw him in the ED.  His abdominal pain has improved with pain medication.   Objective:  Vitals:   07/24/20 1800 07/24/20 1835  BP: (!) 162/120 (!) 142/68  Pulse: (!) 105 89  Resp: (!) 26 18  Temp:    SpO2: 98% 98%    Body mass index is 23.72 kg/m.  Intake/Output Summary (Last 24 hours) at 07/24/2020 1920 Last data filed at 07/24/2020 1758 Gross per 24 hour  Intake 435 ml  Output --  Net 435 ml     Sclerae unicteric  Alert and oriented   Neuro nonfocal    CBG (last 3)  No results for input(s): GLUCAP in the last 72 hours.   Labs:   Urine Studies No results for input(s): UHGB, CRYS in the last 72 hours.  Invalid input(s): UACOL, UAPR, USPG, UPH, UTP, UGL, UKET, UBIL, UNIT, UROB, ULEU, UEPI, UWBC, URBC, UBAC, CAST, UCOM, BILUA  Basic Metabolic Panel: Recent Labs  Lab 07/19/20 0651 07/24/20 0709  NA 137 140  K 4.0 4.5  CL 104 103  CO2 22 21*  GLUCOSE 99 178*  BUN 14 24*  CREATININE 1.21 3.03*  CALCIUM 9.4 9.9   GFR Estimated Creatinine Clearance: 23.9 mL/min (A) (by C-G formula based on SCr of 3.03 mg/dL (H)). Liver Function Tests: Recent Labs  Lab 07/19/20 0651 07/24/20 0709  AST 183* 204*  ALT 71* 79*  ALKPHOS 285* 282*  BILITOT 1.8* 2.1*  PROT 8.0 8.0  ALBUMIN 3.2* 3.3*   Recent Labs  Lab 07/24/20 0709  LIPASE 39   No results for input(s): AMMONIA in the last 168  hours. Coagulation profile Recent Labs  Lab 07/24/20 0810  INR 1.5*    CBC: Recent Labs  Lab 07/19/20 0651 07/24/20 0709 07/24/20 0854 07/24/20 1430  WBC 6.2 9.5 8.7 6.2  NEUTROABS 3.5 6.9  --   --   HGB 10.2* 8.2* 6.5* 8.0*  HCT 29.4* 26.9* 20.5* 24.8*  MCV 88.0 96.8 95.3 91.9  PLT 218 260 241 168   Cardiac Enzymes: No results for input(s): CKTOTAL, CKMB, CKMBINDEX, TROPONINI in the last 168 hours. BNP: Invalid input(s): POCBNP CBG: No results for input(s): GLUCAP in the last 168 hours. D-Dimer No results for input(s): DDIMER in the last 72 hours. Hgb A1c No results for input(s): HGBA1C in the last 72 hours. Lipid Profile No results for input(s): CHOL, HDL, LDLCALC, TRIG, CHOLHDL, LDLDIRECT in the last 72 hours. Thyroid function studies No results for input(s): TSH, T4TOTAL, T3FREE, THYROIDAB in the last 72 hours.  Invalid input(s): FREET3 Anemia work up No results for input(s): VITAMINB12, FOLATE, FERRITIN, TIBC, IRON, RETICCTPCT in the last 72 hours. Microbiology Recent Results (from the past 240 hour(s))  SARS Coronavirus 2 by RT PCR (hospital order, performed in [facility] hospital lab) Nasopharyngeal Nasopharyngeal Swab     Status: None   Collection  Time: 07/24/20  8:17 AM   Specimen: Nasopharyngeal Swab  Result Value Ref Range Status   SARS Coronavirus 2 NEGATIVE NEGATIVE Final    Comment: (NOTE) SARS-CoV-2 target nucleic acids are NOT DETECTED.  The SARS-CoV-2 RNA is generally detectable in upper and lower respiratory specimens during the acute phase of infection. The lowest concentration of SARS-CoV-2 viral copies this assay can detect is 250 copies / mL. A negative result does not preclude SARS-CoV-2 infection and should not be used as the sole basis for treatment or other patient management decisions.  A negative result may occur with improper specimen collection / handling, submission of specimen other than nasopharyngeal swab, presence of viral  mutation(s) within the areas targeted by this assay, and inadequate number of viral copies (<250 copies / mL). A negative result must be combined with clinical observations, patient history, and epidemiological information.  Fact Sheet for Patients:   StrictlyIdeas.no  Fact Sheet for Healthcare Providers: BankingDealers.co.za  This test is not yet approved or  cleared by the [place] FDA and has been authorized for detection and/or diagnosis of SARS-CoV-2 by FDA under an Emergency Use Authorization (EUA).  This EUA will remain in effect (meaning this test can be used) for the duration of the COVID-19 declaration under Section 564(b)(1) of the Act, 21 U.S.C. section 360bbb-3(b)(1), unless the authorization is terminated or revoked sooner.  Performed at [facility], [place] [address], [place], [place] [address]       Studies:  CT ABDOMEN PELVIS WO CONTRAST  Result Date: 07/24/2020 CLINICAL DATA:  Acute generalized abdominal pain and distention. History of hepatocellular carcinoma. EXAM: CT ABDOMEN AND PELVIS WITHOUT CONTRAST TECHNIQUE: Multidetector CT imaging of the abdomen and pelvis was performed following the standard protocol without IV contrast. COMPARISON:  July 18, 2020. FINDINGS: Lower chest: Mild bibasilar subsegmental atelectasis is noted. Hepatobiliary: No gallstones or biliary dilatation is noted. Multiple rounded hypoechoic lesions are noted throughout the liver consistent with metastatic disease. Pancreas: Unremarkable. No pancreatic ductal dilatation or surrounding inflammatory changes. Spleen: Normal in size without focal abnormality. Adrenals/Urinary Tract: Adrenal glands are unremarkable. Kidneys are normal, without renal calculi, focal lesion, or hydronephrosis. Bladder is unremarkable. Stomach/Bowel: The stomach is unremarkable. There is no evidence of bowel obstruction or inflammation the appendix  appears normal. Vascular/Lymphatic: Aortic atherosclerosis. No enlarged abdominal or pelvic lymph nodes. Reproductive: Prostate is unremarkable. Other: There is the interval development of high density fluid in the pelvis most consistent with hemorrhage. There also appears to be complex hyperdense abnormality seen anteriorly in the central portion of the abdomen, also consistent with intraperitoneal hematoma or hemorrhage. No hernia is noted. Hemorrhage is also noted inferior to the right hepatic lobe. Musculoskeletal: Large lytic destructive lesions are noted in the left sacrum, L1 and L4 vertebral bodies. The L4 lesion appears to extend into the right paraspinal soft tissues. IMPRESSION: 1. Interval development of high density fluid in the pelvis most consistent with hemorrhage. There also appears to be complex hyperdense abnormality seen anteriorly in the central portion of the abdomen, also consistent with intraperitoneal hematoma or hemorrhage. Hemorrhage is also noted inferior to the right hepatic lobe. Critical Value/emergent results were called by telephone at the time of interpretation on 07/24/2020 at 8:46 am to provider ADAM CURATOLO , who verbally acknowledged these results. 2. Multiple rounded hypoechoic lesions are noted throughout the liver consistent with metastatic disease. 3. Large lytic destructive lesions are noted in the left sacrum, L1 and L4 vertebral bodies consistent with  metastatic disease. The L4 lesion appears to extend into the right paraspinal soft tissues. 4. Aortic atherosclerosis. Aortic Atherosclerosis (ICD10-I70.0). Electronically Signed   By: Marijo Conception M.D.   On: 07/24/2020 08:46    Assessment: 59 y.o.  Male   1.  Anemia secondary to blood loss 2.  Intra-abdominal hematoma 3.  Recurrent PE, on eliquis  4.  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   Liver cirrhosis and hepatitis C 7. AKI   Plan:  -Appreciated ICU's care and IR input.  His intra-abdominal bleeding is likely related to his liver cancer and anticoagulation -will hold on anticoagulation now due to severe bleeding, not sure if he is able to restart due to high risk of bleeding -He has recurrence of acute PE, likely related to his underlying malignancy.  Consider lower extremity DVT, if negative, I am not sure if IVC filter will be beneficial. -We recently discussed switching his cancer treatment to immunotherapy and bevacizumab due to his recent cancer progression.  Unfortunately bevacizumab will be contraindicated due to his PE and bleeding.  He now has developed worsening liver function and AKI, if he is able to recover well enough, I would consider immunotherapy single agent.  However I am not very optimistic that his liver and kidney function will recover well, and I think his prognosis is very poor.  -I will continue f/u in hospital.    Truitt Merle, MD 07/24/2020

## 2020-07-24 NOTE — ED Notes (Signed)
Date and time results received: 07/24/20 0945 (use smartphrase ".now" to insert current time)  Test: Lactic acid Critical Value: 3.0  Name of Provider Notified: P.A. Logan  Orders Received? Or Actions Taken?:

## 2020-07-24 NOTE — ED Notes (Signed)
ED TO INPATIENT HANDOFF REPORT  Name/Age/Gender Vernon [name] 59 y.o. male  Code Status    Code Status Orders  (From admission, onward)         Start     Ordered   07/24/20 1239  Full code  Continuous        07/24/20 1239        Code Status History    Date Active Date Inactive Code Status Order ID Comments User Context   04/25/2020 0401 04/26/2020 1919 Full Code [id number]  Vernon Emerald, MD ED   Advance Care Planning Activity      Home/SNF/Other Home  Chief Complaint Abdominal pain [R10.9]  Level of Care/Admitting Diagnosis ED Disposition    ED Disposition Condition Vernon [name]: [facility] [[id number]]  Level of Care: ICU [6]  May admit patient to Vernon [name] or Vernon [name] if equivalent level of care is available:: Yes  Covid Evaluation: Asymptomatic Screening Protocol (No Symptoms)  Diagnosis: Abdominal pain [195093]  Admitting Physician: Brand Males [phone]  Attending Physician: Brand Males [phone]  Estimated length of stay: 3 - 4 days  Certification:: I certify this patient will need inpatient services for at least 2 midnights       Medical History Past Medical History:  Diagnosis Date  . Anemia   . Gout   . Hepatitis C   . Hepatocellular carcinoma ([place])   . Pulmonary embolism (HCC)    Recurrent, on Eliquis     Allergies Allergies  Allergen Reactions  . Ms Contin Cleone Slim Sulfate Er] Nausea And Vomiting    IV Location/Drains/Wounds Patient Lines/Drains/Airways Status    Active Line/Drains/Airways    Name Placement date Placement time Site Days   Peripheral IV 07/24/20 Right;Anterior Forearm 07/24/20  0710  Forearm  less than 1   Peripheral IV 07/24/20 Left Wrist 07/24/20  1046  Wrist  less than 1   Peripheral IV 07/24/20 Left Hand 07/24/20  1110  Hand  less than 1          Labs/Imaging Results for orders placed or performed during the hospital encounter of 07/24/20 (from the past 48  hour(s))  CBC with Differential     Status: Abnormal   Collection Time: 07/24/20  7:09 AM  Result Value Ref Range   WBC 9.5 4.0 - 10.5 K/uL   RBC 2.78 (L) 4.22 - 5.81 MIL/uL   Hemoglobin 8.2 (L) 13.0 - 17.0 g/dL   HCT 26.9 (L) 39 - 52 %   MCV 96.8 80.0 - 100.0 fL   MCH 29.5 26.0 - 34.0 pg   MCHC 30.5 30.0 - 36.0 g/dL   RDW 19.2 (H) 11.5 - 15.5 %   Platelets 260 150 - 400 K/uL   nRBC 0.0 0.0 - 0.2 %   Neutrophils Relative % 72 %   Neutro Abs 6.9 1.7 - 7.7 K/uL   Lymphocytes Relative 16 %   Lymphs Abs 1.5 0.7 - 4.0 K/uL   Monocytes Relative 9 %   Monocytes Absolute 0.8 0 - 1 K/uL   Eosinophils Relative 1 %   Eosinophils Absolute 0.1 0 - 0 K/uL   Basophils Relative 1 %   Basophils Absolute 0.1 0 - 0 K/uL   Immature Granulocytes 1 %   Abs Immature Granulocytes 0.08 (H) 0.00 - 0.07 K/uL    Comment: Performed at [facility], [place] [address], [place], [place] [address]  Comprehensive metabolic panel  Status: Abnormal   Collection Time: 07/24/20  7:09 AM  Result Value Ref Range   Sodium 140 135 - 145 mmol/L   Potassium 4.5 3.5 - 5.1 mmol/L   Chloride 103 98 - 111 mmol/L   CO2 21 (L) 22 - 32 mmol/L   Glucose, Bld 178 (H) 70 - 99 mg/dL    Comment: Glucose reference range applies only to samples taken after fasting for at least 8 hours.   BUN 24 (H) 8 - 23 mg/dL   Creatinine, Ser 3.03 (H) 0.61 - 1.24 mg/dL   Calcium 9.9 8.9 - 10.3 mg/dL   Total Protein 8.0 6.5 - 8.1 g/dL   Albumin 3.3 (L) 3.5 - 5.0 g/dL   AST 204 (H) 15 - 41 U/L   ALT 79 (H) 0 - 44 U/L   Alkaline Phosphatase 282 (H) 38 - 126 U/L   Total Bilirubin 2.1 (H) 0.3 - 1.2 mg/dL   GFR calc non Af Amer 21 (L) >60 mL/min   GFR calc Af Amer 25 (L) >60 mL/min   Anion gap 16 (H) 5 - 15    Comment: Performed at [facility], [place] [address], [place], [place] [address]  Lipase, blood     Status: None   Collection Time: 07/24/20  7:09 AM  Result Value Ref Range   Lipase 39 11 - 51 U/L     Comment: Performed at [facility], [place] [address] [place], [place] [address]  ABO/Rh     Status: None   Collection Time: 07/24/20  7:09 AM  Result Value Ref Range   ABO/RH(D)      O POS Performed at [facility], [place] [address] [place], [place] [address]   Protime-INR     Status: Abnormal   Collection Time: 07/24/20  8:10 AM  Result Value Ref Range   Prothrombin Time 17.5 (H) 11.4 - 15.2 seconds   INR 1.5 (H) 0.8 - 1.2    Comment: (NOTE) INR goal varies based on device and disease states. Performed at [facility], [place] [address] [place], [place] [address]   Lactic acid, plasma     Status: Abnormal   Collection Time: 07/24/20  8:10 AM  Result Value Ref Range   Lactic Acid, Venous 3.0 (HH) 0.5 - 1.9 mmol/L    Comment: CRITICAL RESULT CALLED TO, READ BACK BY AND VERIFIED WITH: SMITH,T RN @ 09:41 07/24/20 BY Bernadene Bell Performed at [facility], [place] [address], [place], [place] [address]   SARS Coronavirus 2 by RT PCR (hospital order, performed in [facility] hospital lab) Nasopharyngeal Nasopharyngeal Swab     Status: None   Collection Time: 07/24/20  8:17 AM   Specimen: Nasopharyngeal Swab  Result Value Ref Range   SARS Coronavirus 2 NEGATIVE NEGATIVE    Comment: (NOTE) SARS-CoV-2 target nucleic acids are NOT DETECTED.  The SARS-CoV-2 RNA is generally detectable in upper and lower respiratory specimens during the acute phase of infection. The lowest concentration of SARS-CoV-2 viral copies this assay can detect is 250 copies / mL. A negative result does not preclude SARS-CoV-2 infection and should not be used as the sole basis for treatment or other patient management decisions.  A negative result may occur with improper specimen collection / handling, submission of specimen other than nasopharyngeal swab, presence of viral mutation(s) within the areas targeted by this assay, and inadequate  number of viral copies (<250 copies / mL). A negative result  must be combined with clinical observations, patient history, and epidemiological information.  Fact Sheet for Patients:   StrictlyIdeas.no  Fact Sheet for Healthcare Providers: BankingDealers.co.za  This test is not yet approved or  cleared by the [place] FDA and has been authorized for detection and/or diagnosis of SARS-CoV-2 by FDA under an Emergency Use Authorization (EUA).  This EUA will remain in effect (meaning this test can be used) for the duration of the COVID-19 declaration under Section 564(b)(1) of the Act, 21 U.S.C. section 360bbb-3(b)(1), unless the authorization is terminated or revoked sooner.  Performed at [facility], [place] [address] [place], [place] [address]   Type and screen [place]     Status: None (Preliminary result)   Collection Time: 07/24/20  8:54 AM  Result Value Ref Range   ABO/RH(D) O POS    Antibody Screen NEG    Sample Expiration 07/27/2020,2359    Unit Number [id number]    Blood Component Type RED CELLS,LR    Unit division 00    Status of Unit ISSUED    Unit tag comment EMERGENCY RELEASE    Transfusion Status OK TO TRANSFUSE    Crossmatch Result COMPATIBLE    Unit Number [id number]    Blood Component Type RED CELLS,LR    Unit division 00    Status of Unit ISSUED    Unit tag comment EMERGENCY RELEASE    Transfusion Status OK TO TRANSFUSE    Crossmatch Result COMPATIBLE    Unit Number [id number]    Blood Component Type RED CELLS,LR    Unit division 00    Status of Unit ALLOCATED    Transfusion Status OK TO TRANSFUSE    Crossmatch Result      Compatible Performed at [place] [address], [place], [place] [address]   CBC     Status: Abnormal   Collection Time: 07/24/20  8:54 AM  Result Value Ref Range   WBC 8.7 4.0 - 10.5 K/uL   RBC 2.15 (L)  4.22 - 5.81 MIL/uL   Hemoglobin 6.5 (LL) 13.0 - 17.0 g/dL    Comment: REPEATED TO VERIFY THIS CRITICAL RESULT HAS VERIFIED AND BEEN CALLED TO GARRISON,G. RN BY NICOLE MCCOY ON 08 25 2021 AT 0923, AND HAS BEEN READ BACK. CRITICAL RESULT VERIFIED    HCT 20.5 (L) 39 - 52 %   MCV 95.3 80.0 - 100.0 fL   MCH 30.2 26.0 - 34.0 pg   MCHC 31.7 30.0 - 36.0 g/dL   RDW 19.8 (H) 11.5 - 15.5 %   Platelets 241 150 - 400 K/uL   nRBC 0.0 0.0 - 0.2 %    Comment: Performed at [facility], [place] [address] [place], [place] [address]  Prepare RBC (crossmatch)     Status: None   Collection Time: 07/24/20  9:30 AM  Result Value Ref Range   Order Confirmation      ORDER PROCESSED BY BLOOD BANK Performed at [place] [address], [place], [place] [address]   Prepare fresh frozen plasma     Status: None (Preliminary result)   Collection Time: 07/24/20  9:32 AM  Result Value Ref Range   Unit Number [id number]    Blood Component Type THW PLS APHR    Unit division B0    Status of Unit ISSUED    Transfusion Status      OK TO TRANSFUSE Performed at [place]  [address], [place], [place] [address]    Unit Number [id number]    Blood Component Type THAWED PLASMA    Unit division 00    Status of Unit ISSUED    Transfusion Status OK TO TRANSFUSE   Prepare RBC (crossmatch)     Status: None   Collection Time: 07/24/20  9:39 AM  Result Value Ref Range   Order Confirmation      ORDER PROCESSED BY BLOOD BANK Performed at [facility], [place] [address], [place], [place] [address]   Heparin level - if patient on rivaroxaban Alveda Reasons) or apixaban Arne Cleveland)     Status: Abnormal   Collection Time: 07/24/20 10:00 AM  Result Value Ref Range   Heparin Unfractionated >2.20 (H) 0.30 - 0.70 IU/mL    Comment: RESULTS CONFIRMED BY MANUAL DILUTION (NOTE) If heparin results are below expected values, and patient dosage has  been  confirmed, suggest follow up testing of antithrombin III levels. Performed at [facility], [place] [address], [place], [place] [address]   APTT     Status: Abnormal   Collection Time: 07/24/20 10:00 AM  Result Value Ref Range   aPTT 37 (H) 24 - 36 seconds    Comment:        IF BASELINE aPTT IS ELEVATED, SUGGEST PATIENT RISK ASSESSMENT BE USED TO DETERMINE APPROPRIATE ANTICOAGULANT THERAPY. Performed at [facility], [place] [address], [place], [place] [address]   CBC     Status: Abnormal   Collection Time: 07/24/20  2:30 PM  Result Value Ref Range   WBC 6.2 4.0 - 10.5 K/uL   RBC 2.70 (L) 4.22 - 5.81 MIL/uL   Hemoglobin 8.0 (L) 13.0 - 17.0 g/dL   HCT 24.8 (L) 39 - 52 %   MCV 91.9 80.0 - 100.0 fL   MCH 29.6 26.0 - 34.0 pg   MCHC 32.3 30.0 - 36.0 g/dL   RDW 18.6 (H) 11.5 - 15.5 %   Platelets 168 150 - 400 K/uL   nRBC 0.0 0.0 - 0.2 %    Comment: Performed at [facility], [place] [address], [place], [place] [address]   CT ABDOMEN PELVIS WO CONTRAST  Result Date: 07/24/2020 CLINICAL DATA:  Acute generalized abdominal pain and distention. History of hepatocellular carcinoma. EXAM: CT ABDOMEN AND PELVIS WITHOUT CONTRAST TECHNIQUE: Multidetector CT imaging of the abdomen and pelvis was performed following the standard protocol without IV contrast. COMPARISON:  July 18, 2020. FINDINGS: Lower chest: Mild bibasilar subsegmental atelectasis is noted. Hepatobiliary: No gallstones or biliary dilatation is noted. Multiple rounded hypoechoic lesions are noted throughout the liver consistent with metastatic disease. Pancreas: Unremarkable. No pancreatic ductal dilatation or surrounding inflammatory changes. Spleen: Normal in size without focal abnormality. Adrenals/Urinary Tract: Adrenal glands are unremarkable. Kidneys are normal, without renal calculi, focal lesion, or hydronephrosis. Bladder is unremarkable. Stomach/Bowel: The stomach is  unremarkable. There is no evidence of bowel obstruction or inflammation the appendix appears normal. Vascular/Lymphatic: Aortic atherosclerosis. No enlarged abdominal or pelvic lymph nodes. Reproductive: Prostate is unremarkable. Other: There is the interval development of high density fluid in the pelvis most consistent with hemorrhage. There also appears to be complex hyperdense abnormality seen anteriorly in the central portion of the abdomen, also consistent with intraperitoneal hematoma or hemorrhage. No hernia is noted. Hemorrhage is also noted inferior to the right hepatic lobe. Musculoskeletal: Large lytic destructive lesions are noted in the left sacrum, L1 and L4 vertebral bodies. The L4 lesion appears to  extend into the right paraspinal soft tissues. IMPRESSION: 1. Interval development of high density fluid in the pelvis most consistent with hemorrhage. There also appears to be complex hyperdense abnormality seen anteriorly in the central portion of the abdomen, also consistent with intraperitoneal hematoma or hemorrhage. Hemorrhage is also noted inferior to the right hepatic lobe. Critical Value/emergent results were called by telephone at the time of interpretation on 07/24/2020 at 8:46 am to provider ADAM CURATOLO , who verbally acknowledged these results. 2. Multiple rounded hypoechoic lesions are noted throughout the liver consistent with metastatic disease. 3. Large lytic destructive lesions are noted in the left sacrum, L1 and L4 vertebral bodies consistent with metastatic disease. The L4 lesion appears to extend into the right paraspinal soft tissues. 4. Aortic atherosclerosis. Aortic Atherosclerosis (ICD10-I70.0). Electronically Signed   By: Marijo Conception M.D.   On: 07/24/2020 08:46    Pending Labs Unresulted Labs (From admission, onward)          Start     Ordered   07/25/20 8841  Basic metabolic panel  Tomorrow morning,   R        07/24/20 1239   07/25/20 0500  Magnesium  Tomorrow  morning,   R        07/24/20 1239   07/25/20 0500  Phosphorus  Tomorrow morning,   R        07/24/20 1239   07/24/20 2000  CBC  Now then every 6 hours,   R (with STAT occurrences)      07/24/20 1240   07/24/20 0810  Blood culture (routine x 2)  BLOOD CULTURE X 2,   STAT      07/24/20 0809   07/24/20 0810  Lactic acid, plasma  Now then every 2 hours,   STAT      07/24/20 0809   07/24/20 0651  Urinalysis, Routine w reflex microscopic  ONCE - STAT,   STAT        07/24/20 0652          Vitals/Pain Today's Vitals   07/24/20 1545 07/24/20 1557 07/24/20 1600 07/24/20 1615  BP: 94/65  97/69 115/70  Pulse: 80  79 82  Resp:   12 12  Temp:      TempSrc:      SpO2: 96%  96% 100%  Weight:      Height:      PainSc:  Asleep      Isolation Precautions Airborne precautions  Medications Medications  sodium chloride (PF) 0.9 % injection (has no administration in time range)  0.9 %  sodium chloride infusion (0 mL/hr Intravenous Hold 07/24/20 1005)  0.9 %  sodium chloride infusion (0 mL/hr Intravenous Hold 07/24/20 1005)  docusate sodium (COLACE) capsule 100 mg (has no administration in time range)  polyethylene glycol (MIRALAX / GLYCOLAX) packet 17 g (has no administration in time range)  ondansetron (ZOFRAN) injection 4 mg (has no administration in time range)  oxyCODONE (Oxy IR/ROXICODONE) immediate release tablet 5 mg (has no administration in time range)  HYDROmorphone (DILAUDID) injection 0.5 mg (0.5 mg Intravenous Given 07/24/20 0710)  sodium chloride 0.9 % bolus 1,000 mL (0 mLs Intravenous Stopped 07/24/20 0930)  0.9 %  sodium chloride infusion (10 mL/hr Intravenous New Bag/Given 07/24/20 1111)  prothrombin complex conc human (KCENTRA) IVPB 3,309 Units (0 Units Intravenous Stopped 07/24/20 1216)    Mobility walks

## 2020-07-24 NOTE — Progress Notes (Signed)
Referring Physician(s): Curatolo,A  Supervising Physician: Markus Daft  Patient Status:  [facility] - In-pt  Chief Complaint:  Abdominal pain/intraabdominal bleed, advanced hepatocellular carcinoma  Subjective: Patient familiar to IR service from right liver lesion biopsy in 2019.  He has a history of hepatitis C and stage IV multifocal HCC along with previously diagnosed right lower lobe PE in May of this year, currently on Eliquis.  He has had a history of worsening abdominal pain over the past several days and presented to the ED for evaluation.  CT of the abdomen pelvis performed today revealed:   1. Interval development of high density fluid in the pelvis most consistent with hemorrhage. There also appears to be complex hyperdense abnormality seen anteriorly in the central portion of the abdomen, also consistent with intraperitoneal hematoma or hemorrhage. Hemorrhage is also noted inferior to the right hepatic lobe.  2. Multiple rounded hypoechoic lesions are noted throughout the liver consistent with metastatic disease. 3. Large lytic destructive lesions are noted in the left sacrum, L1 and L4 vertebral bodies consistent with metastatic disease. The L4 lesion appears to extend into the right paraspinal soft tissues. 4. Aortic atherosclerosis.  He has received FFP, packed red blood cells and Kcentra to reverse Eliquis.  He is COVID-19 negative.  Current BP and heart rate okay, he is afebrile.  He reports no significant abdominal pain at this time.  He currently denies fever, headache, chest pain, back pain, nausea, vomiting or visible bleeding.  He does have some dyspnea as well as occasional cough.  Latest CBC reveals normal white count, hemoglobin of 8 up from 6.5, platelets 168k, creatinine 3.03, PT 17.5, INR 1.5, total bilirubin 2.1.  Request received from ED team for consideration of possible hepatic embolization.  Past Medical History:  Diagnosis Date  . Anemia   . Gout   .  Hepatitis C   . Hepatocellular carcinoma ([place])   . Pulmonary embolism (HCC)    Recurrent, on Eliquis    History reviewed. No pertinent surgical history.    Allergies: Ms contin [morphine sulfate er]  Medications: Prior to Admission medications   Medication Sig Start Date End Date Taking? Authorizing Provider  albuterol (VENTOLIN HFA) 108 (90 Base) MCG/ACT inhaler Take 90 mcg by mouth every 4 (four) hours as needed.   Yes [provider]  apixaban (ELIQUIS) 5 MG TABS tablet Take 2 tablets (10mg ) twice daily for 7 days, then 1 tablet (5mg ) twice daily 07/19/20  Yes Truitt Merle, MD  gabapentin (NEURONTIN) 300 MG capsule Take 1 capsule (300 mg total) by mouth 3 (three) times daily. 05/31/20  Yes Truitt Merle, MD  ondansetron (ZOFRAN) 8 MG tablet Take 1 tablet (8 mg total) by mouth every 8 (eight) hours as needed for nausea or vomiting. 06/26/20  Yes Truitt Merle, MD  oxyCODONE ER [facility] ER) 13.5 MG C12A Take 13.5 mg by mouth every 12 (twelve) hours. 07/11/20  Yes Alla Feeling, NP  Oxycodone HCl 10 MG TABS Take 1 tablet (10 mg total) by mouth every 6 (six) hours as needed. OK to refill on 05/18/2020 07/22/20  Yes Truitt Merle, MD  polyethylene glycol (MIRALAX / GLYCOLAX) 17 g packet Take 17 g by mouth daily as needed for mild constipation. 04/26/20  Yes Amin, Jeanella Flattery, MD  prochlorperazine (COMPAZINE) 10 MG tablet Take 1 tablet (10 mg total) by mouth every 6 (six) hours as needed for nausea or vomiting. 06/21/20  Yes Truitt Merle, MD  dronabinol (MARINOL) 2.5 MG  capsule Take 1 capsule (2.5 mg total) by mouth 2 (two) times daily before a meal. Patient not taking: Reported on 07/24/2020 06/21/20   Truitt Merle, MD  Lenvatinib 12 mg daily dose (LENVIMA) 3 x 4 MG capsule Take 12 mg by mouth daily. Start at 4mg  daily for first week, then increase to 8mg  daily for second week and 12mg  daily for third week if tolerates well Patient not taking: Reported on 06/21/2020 05/27/20   Truitt Merle, MD     Vital  Signs: BP 122/69   Pulse 89   Temp 98.5 F (36.9 C) (Oral)   Resp (!) 21   Ht 5\' 7"  (1.702 m)   Wt 149 lb 12.8 oz (67.9 kg)   SpO2 98%   BMI 23.46 kg/m   Physical Exam   awake, alert.  Chest clear to auscultation bilaterally.  Heart with regular rate and rhythm.  Abdomen soft, positive bowel sounds, currently nontender.  No lower extremity edema.  Imaging: CT ABDOMEN PELVIS WO CONTRAST  Result Date: 07/24/2020 CLINICAL DATA:  Acute generalized abdominal pain and distention. History of hepatocellular carcinoma. EXAM: CT ABDOMEN AND PELVIS WITHOUT CONTRAST TECHNIQUE: Multidetector CT imaging of the abdomen and pelvis was performed following the standard protocol without IV contrast. COMPARISON:  July 18, 2020. FINDINGS: Lower chest: Mild bibasilar subsegmental atelectasis is noted. Hepatobiliary: No gallstones or biliary dilatation is noted. Multiple rounded hypoechoic lesions are noted throughout the liver consistent with metastatic disease. Pancreas: Unremarkable. No pancreatic ductal dilatation or surrounding inflammatory changes. Spleen: Normal in size without focal abnormality. Adrenals/Urinary Tract: Adrenal glands are unremarkable. Kidneys are normal, without renal calculi, focal lesion, or hydronephrosis. Bladder is unremarkable. Stomach/Bowel: The stomach is unremarkable. There is no evidence of bowel obstruction or inflammation the appendix appears normal. Vascular/Lymphatic: Aortic atherosclerosis. No enlarged abdominal or pelvic lymph nodes. Reproductive: Prostate is unremarkable. Other: There is the interval development of high density fluid in the pelvis most consistent with hemorrhage. There also appears to be complex hyperdense abnormality seen anteriorly in the central portion of the abdomen, also consistent with intraperitoneal hematoma or hemorrhage. No hernia is noted. Hemorrhage is also noted inferior to the right hepatic lobe. Musculoskeletal: Large lytic destructive lesions  are noted in the left sacrum, L1 and L4 vertebral bodies. The L4 lesion appears to extend into the right paraspinal soft tissues. IMPRESSION: 1. Interval development of high density fluid in the pelvis most consistent with hemorrhage. There also appears to be complex hyperdense abnormality seen anteriorly in the central portion of the abdomen, also consistent with intraperitoneal hematoma or hemorrhage. Hemorrhage is also noted inferior to the right hepatic lobe. Critical Value/emergent results were called by telephone at the time of interpretation on 07/24/2020 at 8:46 am to provider ADAM CURATOLO , who verbally acknowledged these results. 2. Multiple rounded hypoechoic lesions are noted throughout the liver consistent with metastatic disease. 3. Large lytic destructive lesions are noted in the left sacrum, L1 and L4 vertebral bodies consistent with metastatic disease. The L4 lesion appears to extend into the right paraspinal soft tissues. 4. Aortic atherosclerosis. Aortic Atherosclerosis (ICD10-I70.0). Electronically Signed   By: Marijo Conception M.D.   On: 07/24/2020 08:46    Labs:  CBC: Recent Labs    07/19/20 0651 07/24/20 0709 07/24/20 0854 07/24/20 1430  WBC 6.2 9.5 8.7 6.2  HGB 10.2* 8.2* 6.5* 8.0*  HCT 29.4* 26.9* 20.5* 24.8*  PLT 218 260 241 168    COAGS: Recent Labs    04/24/20 2216  07/24/20 0810 07/24/20 1000  INR 1.0 1.5*  --   APTT  --   --  37*    BMP: Recent Labs    05/31/20 0921 06/21/20 0843 07/19/20 0651 07/24/20 0709  NA 143 143 137 140  K 4.2 4.1 4.0 4.5  CL 108 109 104 103  CO2 23 22 22  21*  GLUCOSE 115* 105* 99 178*  BUN 11 25* 14 24*  CALCIUM 9.5 9.9 9.4 9.9  CREATININE 1.12 1.23 1.21 3.03*  GFRNONAA >60 >60 >60 21*  GFRAA >60 >60 >60 25*    LIVER FUNCTION TESTS: Recent Labs    05/31/20 0921 06/21/20 0843 07/19/20 0651 07/24/20 0709  BILITOT 0.6 0.9 1.8* 2.1*  AST 126* 168* 183* 204*  ALT 88* 117* 71* 79*  ALKPHOS 228* 221* 285* 282*   PROT 7.7 8.2* 8.0 8.0  ALBUMIN 2.9* 2.9* 3.2* 3.3*    Assessment and Plan:  59 yo male with history of hepatitis C and stage IV multifocal HCC along with previously diagnosed right lower lobe PE in May of this year, currently on Eliquis.  He has had a history of worsening abdominal pain over the past several days and presented to the ED for evaluation.  CT of the abdomen pelvis performed today revealed:   1. Interval development of high density fluid in the pelvis most consistent with hemorrhage. There also appears to be complex hyperdense abnormality seen anteriorly in the central portion of the abdomen, also consistent with intraperitoneal hematoma or hemorrhage. Hemorrhage is also noted inferior to the right hepatic lobe.  2. Multiple rounded hypoechoic lesions are noted throughout the liver consistent with metastatic disease. 3. Large lytic destructive lesions are noted in the left sacrum, L1 and L4 vertebral bodies consistent with metastatic disease. The L4 lesion appears to extend into the right paraspinal soft tissues. 4. Aortic atherosclerosis.  He has received FFP, packed red blood cells and Kcentra to reverse Eliquis.  He is COVID-19 negative.  Current BP and heart rate okay, he is afebrile.  He reports no significant abdominal pain at this time.  He currently denies fever, headache, chest pain, back pain, nausea, vomiting or visible bleeding.  He does have some dyspnea as well as occasional cough.  Latest CBC reveals normal white count, hemoglobin of 8 up from 6.5, platelets 168k, creatinine 3.03, PT 17.5, INR 1.5, total bilirubin 2.1.  Request received from ED team for consideration of possible hepatic embolization.  Imaging studies and patient have been evaluated by Dr. Anselm Pancoast.  Patient currently hemodynamically stable at this time; recommend close monitoring of H&H, hydration and close assessment of follow-up renal function with latest creat of 3.03. Risks and benefits of procedure  were discussed with the patient including, but not limited to bleeding, infection, vascular injury or contrast induced renal failure.  This interventional procedure involves the use of X-rays and because of the nature of the planned procedure, it is possible that we will have prolonged use of X-ray fluoroscopy.  Potential radiation risks to you include (but are not limited to) the following: - A slightly elevated risk for cancer  several years later in life. This risk is typically less than 0.5% percent. This risk is low in comparison to the normal incidence of human cancer, which is 33% for women and 50% for men according to the [employer]. - Radiation induced injury can include skin redness, resembling a rash, tissue breakdown / ulcers and hair loss (which can be temporary or permanent).  The likelihood of either of these occurring depends on the difficulty of the procedure and whether you are sensitive to radiation due to previous procedures, disease, or genetic conditions.   IF your procedure requires a prolonged use of radiation, you will be notified and given written instructions for further action.  It is your responsibility to monitor the irradiated area for the 2 weeks following the procedure and to notify your physician if you are concerned that you have suffered a radiation induced injury.    All of the patient's questions were answered, patient is agreeable to proceed if necessary; will cont to monitor          Electronically Signed: D. Rowe Robert, PA-C 07/24/2020, 5:18 PM   I spent a total of 30 minutes at the the patient's bedside AND on the patient's hospital floor or unit, greater than 50% of which was counseling/coordinating care for possible visceral/hepatic arteriogram with embolization    Patient ID: Conni Slipper, male   DOB: [birth year], 59 y.o.   MRN: [id number]

## 2020-07-24 NOTE — ED Provider Notes (Signed)
Medical screening examination/treatment/procedure(s) were conducted as a shared visit with non-physician practitioner(s) and myself.  I personally evaluated the patient during the encounter. Briefly, the patient is a 59 y.o. male with history of hepatocellular carcinoma who presents to the ED with abdominal pain.  Patient hypertensive initially but normotensive upon my evaluation.  Has had worsening abdominal pain over the last day or so.  Recently started on Eliquis for PE several days ago.  Denies any fevers or chills.  No nausea, vomiting, diarrhea.  Tenderness diffusely on exam.  Of note he has not had ascites or paracentesis in the past.  Patient with hemoglobin of 8.2 which is slightly down from 10.5 from several days ago.  INR is 1.5.  Liver enzymes are elevated.  Creatinine is 3.  Creatinine is also elevated at this time.  CT scan shows interval development of likely hemorrhage within the pelvis.  Likely also intraperitoneal hematoma.  This was talk to me on the phone by radiology.  There appears to be hemorrhage noted in the inferior to the right hepatic lobe as well.  This could be spontaneous as patient is on Eliquis and has liver cancer.  Will give a unit of emergency release blood given that patient is borderline hypotensive and had a 2 g drop in hemoglobin within 2 hours.  Repeat CBC showed a hemoglobin of 6.5.  Patient is still full code.  He last took Eliquis about 15 hours ago.  Will type and screen 2 more units of blood and FFP and talk to pharmacy about the potential of [place] as well.  I will touch base with ICU to see if they would like to admit the patient as he has likely active bleeding in his abdomen.  We will also talk with IR to see if he is a potential candidate for any type of intervention however does have significant AKI.  Repeat CBC after 2 hours showed a hemoglobin of 6.5.  At that time emergency release blood was ordered.  Patient with blood pressure was in the low 90s but  improving with fluids.  After talking with pharmacy in ICU will give Kcentra for Eliquis reversal.  IR has been consulted as well and will consider possible diagnostics and treatment if stabilize and creatinine improves.  At this time cannot offer any intervention.  At this time will continue's supportive care with blood and Kcentra.  To be admitted to the ICU.  Patient still wants to remain full code.   .Critical Care Performed by: Lennice Sites, DO Authorized by: Lennice Sites, DO   Critical care provider statement:    Critical care time (minutes):  35   Critical care was necessary to treat or prevent imminent or life-threatening deterioration of the following conditions:  Shock   Critical care was time spent personally by me on the following activities:  Blood draw for specimens, development of treatment plan with patient or surrogate, evaluation of patient's response to treatment, examination of patient, discussions with primary provider, obtaining history from patient or surrogate, ordering and performing treatments and interventions, ordering and review of laboratory studies, ordering and review of radiographic studies, pulse oximetry, re-evaluation of patient's condition and review of old charts   I assumed direction of critical care for this patient from another provider in my specialty: no         EKG Interpretation  Date/Time:  Wednesday July 24 2020 08:42:22 EDT Ventricular Rate:  96 PR Interval:    QRS Duration: 84 QT Interval:  361 QTC Calculation: 457 R Axis:   11 Text Interpretation: Sinus rhythm LVH by voltage Inferior infarct, age indeterminate Confirmed by Lennice Sites [id number]) on 07/24/2020 8:45:43 AM           Lennice Sites, DO 07/24/20 [phone]

## 2020-07-25 ENCOUNTER — Inpatient Hospital Stay (HOSPITAL_COMMUNITY): Payer: Medicaid Other

## 2020-07-25 DIAGNOSIS — I2699 Other pulmonary embolism without acute cor pulmonale: Secondary | ICD-10-CM

## 2020-07-25 DIAGNOSIS — C220 Liver cell carcinoma: Secondary | ICD-10-CM

## 2020-07-25 LAB — CBC
HCT: 28.8 % — ABNORMAL LOW (ref 39.0–52.0)
HCT: 24.4 % — ABNORMAL LOW (ref 39.0–52.0)
Hemoglobin: 9 g/dL — ABNORMAL LOW (ref 13.0–17.0)
Hemoglobin: 7.7 g/dL — ABNORMAL LOW (ref 13.0–17.0)
MCH: 29.1 pg (ref 26.0–34.0)
MCH: 29.4 pg (ref 26.0–34.0)
MCHC: 31.3 g/dL (ref 30.0–36.0)
MCHC: 31.6 g/dL (ref 30.0–36.0)
MCV: 93.2 fL (ref 80.0–100.0)
MCV: 93.1 fL (ref 80.0–100.0)
Platelets: 175 10*3/uL (ref 150–400)
Platelets: 207 10*3/uL (ref 150–400)
RBC: 3.09 MIL/uL — ABNORMAL LOW (ref 4.22–5.81)
RBC: 2.62 MIL/uL — ABNORMAL LOW (ref 4.22–5.81)
RDW: 19.2 % — ABNORMAL HIGH (ref 11.5–15.5)
RDW: 19.3 % — ABNORMAL HIGH (ref 11.5–15.5)
WBC: 6.9 10*3/uL (ref 4.0–10.5)
WBC: 8 10*3/uL (ref 4.0–10.5)
nRBC: 0 % (ref 0.0–0.2)
nRBC: 0 % (ref 0.0–0.2)

## 2020-07-25 LAB — BPAM FFP
Blood Product Expiration Date: 202108302359
Blood Product Expiration Date: 202108302359
ISSUE DATE / TIME: 202108251056
ISSUE DATE / TIME: 202108251211
Unit Type and Rh: 5100
Unit Type and Rh: 5100

## 2020-07-25 LAB — GLUCOSE, CAPILLARY: Glucose-Capillary: 108 mg/dL — ABNORMAL HIGH (ref 70–99)

## 2020-07-25 LAB — HEMOGLOBIN AND HEMATOCRIT, BLOOD
HCT: 24.1 % — ABNORMAL LOW (ref 39.0–52.0)
Hemoglobin: 7.7 g/dL — ABNORMAL LOW (ref 13.0–17.0)

## 2020-07-25 LAB — BASIC METABOLIC PANEL
Anion gap: 12 (ref 5–15)
BUN: 24 mg/dL — ABNORMAL HIGH (ref 8–23)
CO2: 21 mmol/L — ABNORMAL LOW (ref 22–32)
Calcium: 9 mg/dL (ref 8.9–10.3)
Chloride: 104 mmol/L (ref 98–111)
Creatinine, Ser: 1.75 mg/dL — ABNORMAL HIGH (ref 0.61–1.24)
GFR calc Af Amer: 48 mL/min — ABNORMAL LOW (ref >60)
GFR calc non Af Amer: 41 mL/min — ABNORMAL LOW (ref >60)
Glucose, Bld: 82 mg/dL (ref 70–99)
Potassium: 4.2 mmol/L (ref 3.5–5.1)
Sodium: 137 mmol/L (ref 135–145)

## 2020-07-25 LAB — PREPARE FRESH FROZEN PLASMA: Unit division: 0

## 2020-07-25 LAB — MAGNESIUM: Magnesium: 1.5 mg/dL — ABNORMAL LOW (ref 1.7–2.4)

## 2020-07-25 LAB — PHOSPHORUS: Phosphorus: 3.4 mg/dL (ref 2.5–4.6)

## 2020-07-25 IMAGING — DX DG CHEST 1V PORT
1 series · 1 of 1 positions shown · non-contrast
Comparison: Abdominal CT from yesterday.

CLINICAL DATA: Abdominal hemorrhage

EXAM:
PORTABLE CHEST 1 VIEW

[chest ap]
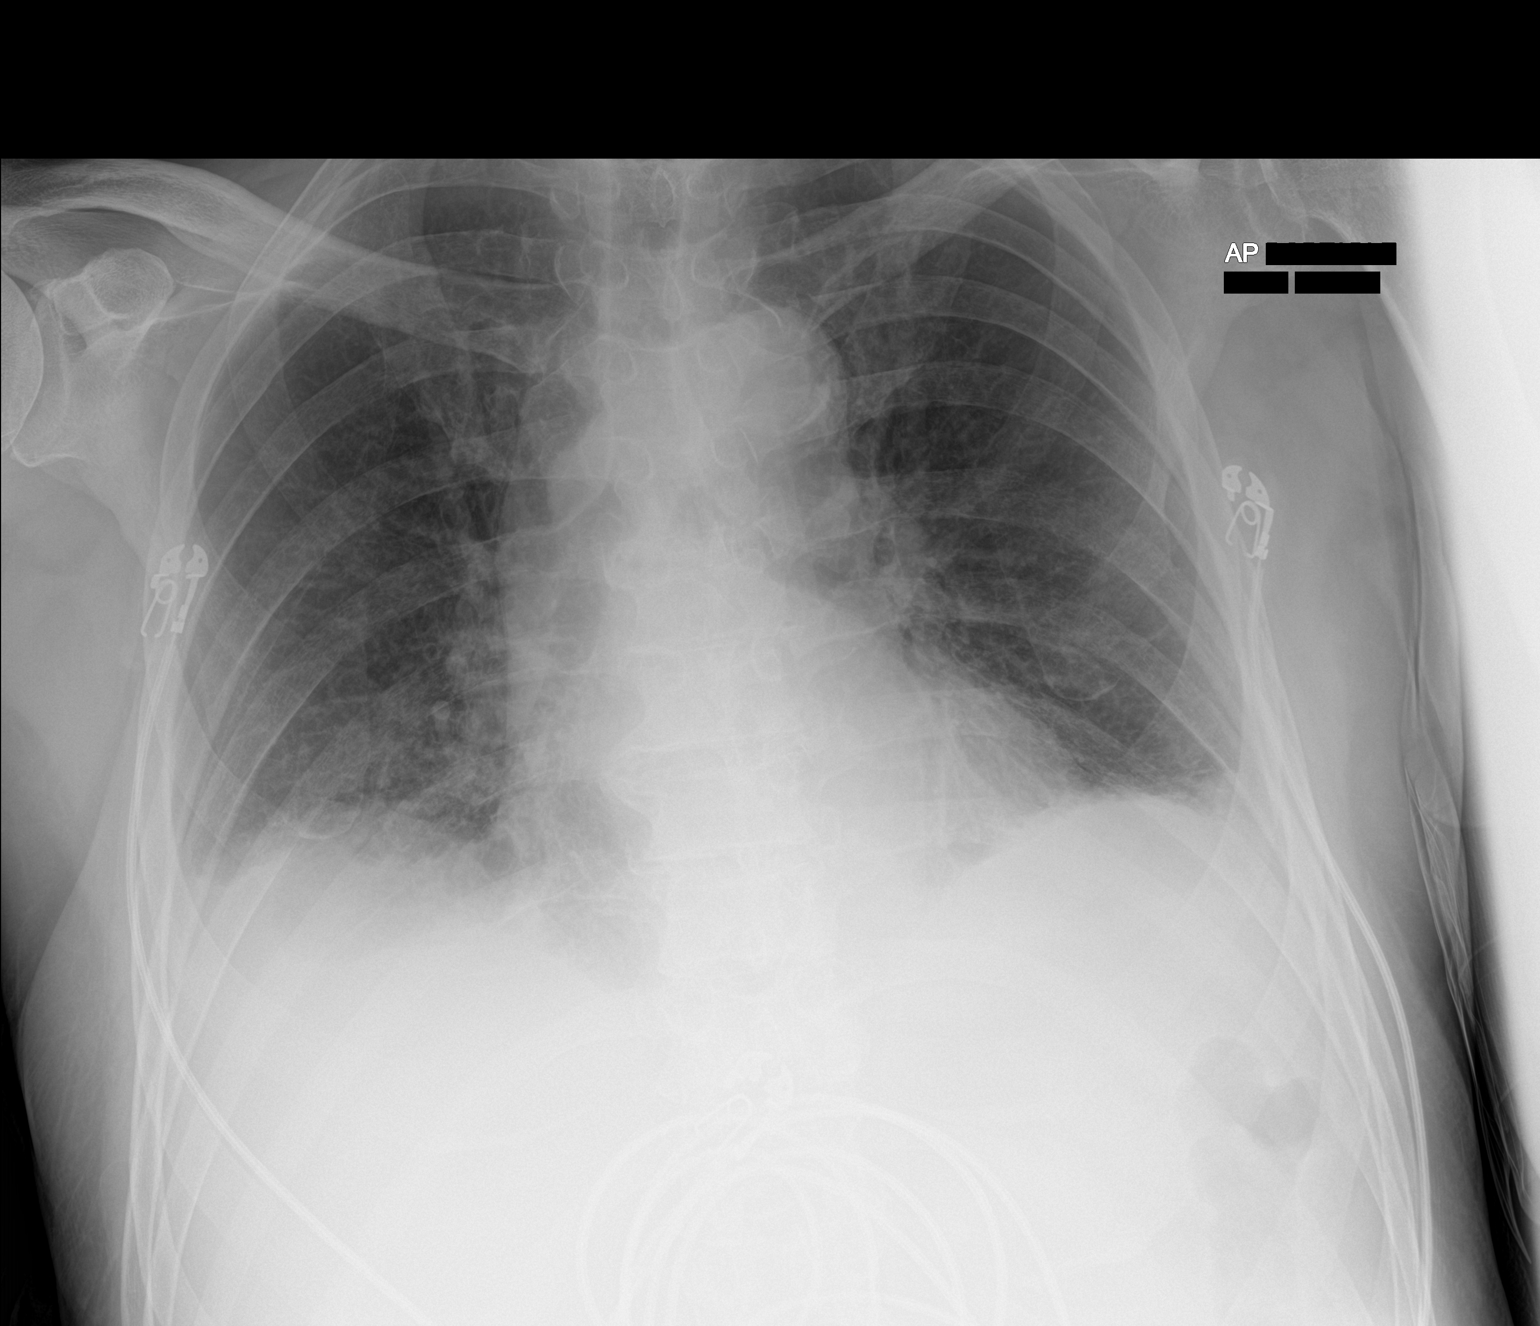

[1 of 1 positions shown; findings below may reference images not displayed]

FINDINGS: Streaky density at the bases attributed atelectasis superimposed on
chronic interstitial coarsening. There is no edema, consolidation,
effusion, or pneumothorax. Normal heart size and mediastinal
contours.
IMPRESSION: Atelectasis at the bases as seen on preceding abdominal CT.

## 2020-07-25 MED ORDER — OXYCODONE HCL 5 MG PO TABS
5.0000 mg | ORAL_TABLET | ORAL | Status: DC | PRN
  Administered 2020-07-25 (×2): 10 mg via ORAL
  Administered 2020-07-26: 5 mg via ORAL
  Administered 2020-07-26 – 2020-07-30 (×10): 10 mg via ORAL
  Filled 2020-07-25 (×12): qty 2
  Filled 2020-07-25: qty 1

## 2020-07-25 MED ORDER — OXYCODONE HCL ER 20 MG PO T12A
20.0000 mg | EXTENDED_RELEASE_TABLET | Freq: Two times a day (BID) | ORAL | Status: DC
  Administered 2020-07-25 – 2020-07-30 (×10): 20 mg via ORAL
  Filled 2020-07-25 (×10): qty 1

## 2020-07-25 MED ORDER — MAGNESIUM SULFATE 50 % IJ SOLN
4.0000 g | Freq: Once | INTRAVENOUS | Status: AC
  Administered 2020-07-25: 4 g via INTRAVENOUS
  Filled 2020-07-25: qty 8

## 2020-07-25 MED ORDER — SODIUM CHLORIDE 0.9 % IV SOLN: INTRAVENOUS | Status: DC

## 2020-07-25 NOTE — Progress Notes (Addendum)
[facility]                                                                              Patient Demographics  Vernon [name], is a 59 y.o. male, DOB - May 21, 1961, [id number]  Admit date - 07/24/2020   Admitting Physician Brand Males, MD  Outpatient Primary MD for the patient is Lorelee Market, MD  Outpatient specialists:   LOS - 1  days   Medical records reviewed and are as summarized below:    No chief complaint on file.      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  On 8/20, patient was seen in ED for worsening shortness of breath, found to have acute PE, Eliquis was increased to 10 mg twice daily. In ED, creatinine 3.03, anion gap 16, alk phos 282, lactic acid 3, AST 204, hemoglobin 6.5, platelets 241 CT abdomen pelvis showed interval development of high density fluid in the pelvis consistent with hemorrhage complex hypodense abnormality anteriorly in the central portion of the abdomen consistent with intraperitoneal hematoma or hemorrhage.  Multiple lesions throughout the liver consistent with metastatic disease, large lytic destructive lesion in left sacrum L1 L4 vertebral bodies consistent with metastatic disease. Patient was hypotensive in ED with SBP of 83, was transfused 2 units packed RBC, BP improved, IR was consulted for possible embolization.  Patient was admitted by CCM to ICU.  Assessment & Plan    Active Problems:   Abdominal pain with Intraperitoneal hemorrhage in the setting of anticoagulation, hypotensive, hemorrhagic shock -Patient was on Eliquis, recently increased on 8/24 new PE presented with abdominal pain and found to have large intraperitoneal hemorrhage -Patient was transfused 2 units packed RBCs, anticoagulation held, admitted by CCM and IR was  consulted. -Reviewed IR and oncology recommendation, follow venous Dopplers for the lower extremity. -IR recommended stopping anticoagulation and will need IVC filter prior to discharge.  If persistent blood loss, will need catheter directed embolization.  Currently n.p.o., will follow recommendations regarding IVC filter placement (?today) Addendum Venous Doppler ultrasound negative for DVT bilaterally IR reevaluation pending for  IVC filter versus embolization (patient is hemodynamically stable, H&H trending down)  History of recent PE, coagulopathy on Eliquis -Eliquis now held, status post Kcentra reversal in the ED -Received 2units packed RBCs with FFP  Acute blood loss anemia Hemoglobin 6.5 at the time of admission, baseline 8- 10 -Received 2 units packed RBC transfusion    AKI (acute kidney injury) (Arjay) with lactic acidosis -Due to acute blood loss anemia and hemorrhagic shock, baseline creatinine 1.2 -Creatinine on presentation 3.0, improving -Status post packed RBC transfusion, creatinine improving to 1.7 today  Metastatic hepatocellular carcinoma -Followed by Dr. Burr Medico, initial diagnosis in 06/2018, metastatic, status post XRT, on chemotherapy, (lenvatinib), intermittently -Oncology consulted, seen by Dr. Burr Medico, had recently discussed switching his treatment to immunotherapy and bevacizumab, contraindicated due to PE and bleeding.  Overall prognosis poor   Chronic pain secondary to malignancy Continue pain  control Palliative medicine consult for goals of care    Code Status: full  DVT Prophylaxis:   SCD's Family Communication: Discussed all imaging results, lab results, explained to the patient    Disposition Plan:     Status is: Inpatient  Remains inpatient appropriate because:Inpatient level of care appropriate due to severity of illness   Dispo: The patient is from: Home              Anticipated d/c is to: Home              Anticipated d/c date is: 2 days               Patient currently is not medically stable to d/c.  Awaiting further recommendations from intervention radiology regarding IVC filter, currently hemodynamically stable.       Time Spent in minutes 35 minutes  Procedures:  None  Consultants:   Admitted by CCM Intervention radiology Oncology  Antimicrobials:   Anti-infectives (From admission, onward)   None          Medications  Scheduled Meds: . Chlorhexidine Gluconate Cloth  6 each Topical Daily  . mouth rinse  15 mL Mouth Rinse BID   Continuous Infusions: . sodium chloride Stopped (07/24/20 1005)  . sodium chloride Stopped (07/24/20 1005)  . magnesium sulfate LVP 250-500 ml     PRN Meds:.docusate sodium, hydrALAZINE, ondansetron (ZOFRAN) IV, oxyCODONE, polyethylene glycol      Subjective:   Deejay Koppelman was seen and examined today.  No overnight bleeding, H&H has remained stable.  No abdominal pain. Patient denies dizziness, chest pain, shortness of breath, N/V/D/C, new weakness, numbess, tingling. No acute events overnight.    Objective:   Vitals:   07/25/20 0403 07/25/20 0500 07/25/20 0600 07/25/20 0816  BP:   129/68   Pulse:   92   Resp:   15   Temp: 98.5 F (36.9 C)   98.2 F (36.8 C)  TempSrc: Oral   Oral  SpO2:   95%   Weight:  67.8 kg    Height:        Intake/Output Summary (Last 24 hours) at 07/25/2020 1123 Last data filed at 07/25/2020 0900 Gross per 24 hour  Intake 475 ml  Output 350 ml  Net 125 ml     Wt Readings from Last 3 Encounters:  07/25/20 67.8 kg  07/19/20 70.8 kg  07/19/20 70.8 kg     Exam  General: Alert and oriented x 3, NAD  Cardiovascular: S1 S2 auscultated, no murmurs, RRR  Respiratory: Clear to auscultation bilaterally, no wheezing, rales or rhonchi  Gastrointestinal: Soft, nontender, nondistended, + bowel sounds  Ext: no pedal edema bilaterally  Neuro: no new deficits  Musculoskeletal: No digital cyanosis, clubbing  Skin: No rashes  Psych:  Normal affect and demeanor, alert and oriented x3    Data Reviewed:  I have personally reviewed following labs and imaging studies  Micro Results Recent Results (from the past 240 hour(s))  Blood culture (routine x 2)     Status: None (Preliminary result)   Collection Time: 07/24/20  8:10 AM   Specimen: BLOOD RIGHT HAND  Result Value Ref Range Status   Specimen Description   Final    BLOOD RIGHT HAND Performed at [facility], [address] [place], [place] [address]    Special Requests   Final    BOTTLES DRAWN AEROBIC AND ANAEROBIC Blood Culture results may not be optimal due to an inadequate  volume of blood received in culture bottles Performed at [facility] [address], [place], [place] [address]    Culture   Final    NO GROWTH < 24 HOURS Performed at [facility] [address], [place], [place] [address]    Report Status PENDING  Incomplete  Blood culture (routine x 2)     Status: None (Preliminary result)   Collection Time: 07/24/20  8:15 AM   Specimen: BLOOD  Result Value Ref Range Status   Specimen Description   Final    BLOOD LEFT WRIST Performed at [facility] [address] [place], [place] [address]    Special Requests   Final    BOTTLES DRAWN AEROBIC ONLY Blood Culture adequate volume Performed at [facility] [address], [place], [place] [address]    Culture   Final    NO GROWTH < 24 HOURS Performed at [facility] [address] [place], [place] [address]    Report Status PENDING  Incomplete  SARS Coronavirus 2 by RT PCR (hospital order, performed in [facility] hospital lab) Nasopharyngeal Nasopharyngeal Swab     Status: None   Collection Time: 07/24/20  8:17 AM   Specimen: Nasopharyngeal Swab  Result Value Ref Range Status   SARS Coronavirus 2 NEGATIVE NEGATIVE Final    Comment: (NOTE) SARS-CoV-2 target nucleic acids are NOT DETECTED.  The  SARS-CoV-2 RNA is generally detectable in upper and lower respiratory specimens during the acute phase of infection. The lowest concentration of SARS-CoV-2 viral copies this assay can detect is 250 copies / mL. A negative result does not preclude SARS-CoV-2 infection and should not be used as the sole basis for treatment or other patient management decisions.  A negative result may occur with improper specimen collection / handling, submission of specimen other than nasopharyngeal swab, presence of viral mutation(s) within the areas targeted by this assay, and inadequate number of viral copies (<250 copies / mL). A negative result must be combined with clinical observations, patient history, and epidemiological information.  Fact Sheet for Patients:   StrictlyIdeas.no  Fact Sheet for Healthcare Providers: BankingDealers.co.za  This test is not yet approved or  cleared by the [place] FDA and has been authorized for detection and/or diagnosis of SARS-CoV-2 by FDA under an Emergency Use Authorization (EUA).  This EUA will remain in effect (meaning this test can be used) for the duration of the COVID-19 declaration under Section 564(b)(1) of the Act, 21 U.S.C. section 360bbb-3(b)(1), unless the authorization is terminated or revoked sooner.  Performed at [facility], [name] [address] [place], [place] [address]   MRSA PCR Screening     Status: None   Collection Time: 07/24/20  5:19 PM   Specimen: Nasopharyngeal  Result Value Ref Range Status   MRSA by PCR NEGATIVE NEGATIVE Final    Comment:        The GeneXpert MRSA Assay (FDA approved for NASAL specimens only), is one component of a comprehensive MRSA colonization surveillance program. It is not intended to diagnose MRSA infection nor to guide or monitor treatment for MRSA infections. Performed at [facility], [place] [address]  [place], [place] [address]     Radiology Reports CT ABDOMEN PELVIS WO CONTRAST  Result Date: 07/24/2020 CLINICAL DATA:  Acute generalized abdominal pain and distention. History of hepatocellular carcinoma. EXAM: CT ABDOMEN AND PELVIS WITHOUT CONTRAST TECHNIQUE: Multidetector CT imaging of the abdomen  and pelvis was performed following the standard protocol without IV contrast. COMPARISON:  July 18, 2020. FINDINGS: Lower chest: Mild bibasilar subsegmental atelectasis is noted. Hepatobiliary: No gallstones or biliary dilatation is noted. Multiple rounded hypoechoic lesions are noted throughout the liver consistent with metastatic disease. Pancreas: Unremarkable. No pancreatic ductal dilatation or surrounding inflammatory changes. Spleen: Normal in size without focal abnormality. Adrenals/Urinary Tract: Adrenal glands are unremarkable. Kidneys are normal, without renal calculi, focal lesion, or hydronephrosis. Bladder is unremarkable. Stomach/Bowel: The stomach is unremarkable. There is no evidence of bowel obstruction or inflammation the appendix appears normal. Vascular/Lymphatic: Aortic atherosclerosis. No enlarged abdominal or pelvic lymph nodes. Reproductive: Prostate is unremarkable. Other: There is the interval development of high density fluid in the pelvis most consistent with hemorrhage. There also appears to be complex hyperdense abnormality seen anteriorly in the central portion of the abdomen, also consistent with intraperitoneal hematoma or hemorrhage. No hernia is noted. Hemorrhage is also noted inferior to the right hepatic lobe. Musculoskeletal: Large lytic destructive lesions are noted in the left sacrum, L1 and L4 vertebral bodies. The L4 lesion appears to extend into the right paraspinal soft tissues. IMPRESSION: 1. Interval development of high density fluid in the pelvis most consistent with hemorrhage. There also appears to be complex hyperdense abnormality seen anteriorly in the central  portion of the abdomen, also consistent with intraperitoneal hematoma or hemorrhage. Hemorrhage is also noted inferior to the right hepatic lobe. Critical Value/emergent results were called by telephone at the time of interpretation on 07/24/2020 at 8:46 am to provider ADAM CURATOLO , who verbally acknowledged these results. 2. Multiple rounded hypoechoic lesions are noted throughout the liver consistent with metastatic disease. 3. Large lytic destructive lesions are noted in the left sacrum, L1 and L4 vertebral bodies consistent with metastatic disease. The L4 lesion appears to extend into the right paraspinal soft tissues. 4. Aortic atherosclerosis. Aortic Atherosclerosis (ICD10-I70.0). Electronically Signed   By: Marijo Conception M.D.   On: 07/24/2020 08:46   DG Chest 2 View  Result Date: 07/19/2020 CLINICAL DATA:  59 year old male with shortness of breath. EXAM: CHEST - 2 VIEW COMPARISON:  Chest radiograph dated 07/19/2020. FINDINGS: No focal consolidation, pleural effusion, or pneumothorax. The cardiac silhouette is within limits. Atherosclerotic calcification of the aorta. No acute osseous pathology. Degenerative changes of the spine and bilateral AC joints. IMPRESSION: No active cardiopulmonary disease. Electronically Signed   By: Anner Crete M.D.   On: 07/19/2020 21:59   DG Chest 2 View  Result Date: 07/19/2020 CLINICAL DATA:  Shortness of breath EXAM: CHEST - 2 VIEW COMPARISON:  Chest CT 04/24/2020 FINDINGS: Normal heart size and mediastinal contours. No acute infiltrate or edema. No effusion or pneumothorax. Known osseous metastatic disease by CT. No acute osseous findings. IMPRESSION: No acute finding. Electronically Signed   By: Monte Fantasia M.D.   On: 07/19/2020 07:25   CT Angio Chest PE W and/or Wo Contrast  Result Date: 07/19/2020 CLINICAL DATA:  Shortness of breath. Possible PE seen on CT 1 day ago. Stage IV multifocal hepatocellular carcinoma. EXAM: CT ANGIOGRAPHY CHEST WITH  CONTRAST TECHNIQUE: Multidetector CT imaging of the chest was performed using the standard protocol during bolus administration of intravenous contrast. Multiplanar CT image reconstructions and MIPs were obtained to evaluate the vascular anatomy. CONTRAST:  49mL OMNIPAQUE IOHEXOL 350 MG/ML SOLN COMPARISON:  CT abdomen-pelvis 07/18/2020.  CT chest 04/24/2020 FINDINGS: Cardiovascular: Satisfactory opacification of the pulmonary arteries to the segmental level. Chronic nonocclusive filling defect within the lobar  and segmental branches of the right lower lobe pulmonary artery (series 4, images 50-52). The thrombus burden has decreased compared to prior CT 04/24/2020. No additional filling defects are identified. No central PE. Normal RV to LV ratio without evidence of right heart strain. Thoracic aorta is nonaneurysmal. Atherosclerotic calcification of the aortic arch. Normal heart size. No pericardial effusion. Mediastinum/Nodes: No enlarged mediastinal, hilar, or axillary lymph nodes. Thyroid gland, trachea, and esophagus demonstrate no significant findings. Lungs/Pleura: Similar mild-moderate emphysematous lung changes. No focal airspace consolidation. No pulmonary nodules or masses. No pleural effusion or pneumothorax. Upper Abdomen: Numerous confluent heterogeneous enhancing liver masses, as detailed on CT abdomen pelvis 07/18/2020. No new or acute findings within the visualized portion of the upper abdomen. Musculoskeletal: Lytic metastatic lesion within the left portion of the T12 vertebral body. Expansile lytic lesion centered within the right T9 transverse process (series 4, image 61). No definite new areas of osseous metastasis are identified. No acute fracture. Review of the MIP images confirms the above findings. IMPRESSION: 1. Chronic nonocclusive filling defect within the lobar and segmental branches of the right lower lobe pulmonary artery. The thrombus burden has decreased compared to prior CT  04/24/2020. No additional filling defects are identified. No evidence of right heart strain. 2. Lungs are clear. 3. Osseous metastatic disease as described above. 4. Numerous confluent heterogeneous enhancing liver masses, as detailed on CT abdomen pelvis 07/18/2020. Aortic Atherosclerosis (ICD10-I70.0) and Emphysema (ICD10-J43.9). Electronically Signed   By: Davina Poke D.O.   On: 07/19/2020 09:06   NM Bone Scan Whole Body  Result Date: 07/18/2020 CLINICAL DATA:  Hepatocellular carcinoma.  Known bony metastasis EXAM: NUCLEAR MEDICINE WHOLE BODY BONE SCAN TECHNIQUE: Whole body anterior and posterior images were obtained approximately 3 hours after intravenous injection of radiopharmaceutical. RADIOPHARMACEUTICALS:  19.6 mCi Technetium-73m MDP IV COMPARISON:  Bone scan 05/20/2020 FINDINGS: Physiologic distribution of radiotracer with activity in the bilateral kidneys and urinary bladder. Faint uptake is seen within the right T9 pedicle. Faint uptake is also seen within the T12 vertebral body. No appreciable radiotracer accumulation within the L4 vertebrae or left hemi sacrum at sites of known metastatic disease. Similar distribution of degenerative uptake including within the bilateral shoulders and knees. IMPRESSION: 1. Faint uptake within the T9 and T12 vertebrae at sites of known metastatic disease. 2. No appreciable focal uptake is seen within the L4 vertebrae or left hemi sacrum at sites of known osseous metastatic disease. These lesions are more evident on CT. Electronically Signed   By: Davina Poke D.O.   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 History of palliative radiation at T9, T10 and L4. Mild going medical therapy. Restaging. EXAM: CT ABDOMEN AND PELVIS WITH CONTRAST TECHNIQUE: Multidetector CT imaging of the abdomen and pelvis was performed using the standard  protocol following bolus administration of intravenous contrast. CONTRAST:  117mL OMNIPAQUE IOHEXOL 300 MG/ML  SOLN COMPARISON:  04/24/2020 CT chest, abdomen and pelvis. FINDINGS: Lower chest: Centrilobular emphysema at the lung bases. Low signal filling defect centrally in the right lower lobe pulmonary artery branch on the uppermost slice (series 2/image 1), potentially acute pulmonary embolus. Hepatobiliary: Numerous bulky confluent heterogeneous hyperenhancing liver masses throughout the liver, increased since 04/24/2020 CT. Representative liver masses as follows: -segment 3 left liver lobe 4.6 x 4.4 cm mass (series 2/image 51), previously 4.3 x 3.7 cm -segment 2 left liver lobe 2.7 x 2.2  cm mass (series 2/image 31), previously 2.0 x 1.5 cm -segment 5 right liver 3.6 x 3.2 cm mass (series 2/image 62), previously 3.0 x 2.5 cm Normal gallbladder with no radiopaque cholelithiasis. No biliary ductal dilatation. Pancreas: Normal, with no mass or duct dilation. Spleen: Normal size. No mass. Adrenals/Urinary Tract: Normal adrenals. Normal kidneys with no hydronephrosis and no renal mass. Normal bladder. Stomach/Bowel: Normal non-distended stomach. Normal caliber small bowel with no small bowel wall thickening. Normal appendix. Normal large bowel with no diverticulosis, large bowel wall thickening or pericolonic fat stranding. Vascular/Lymphatic: Atherosclerotic nonaneurysmal abdominal aorta. Patent portal, splenic, hepatic and renal veins. No pathologically enlarged lymph nodes in the abdomen or pelvis. Reproductive: Normal size prostate. Other: No pneumoperitoneum, ascites or focal fluid collection. Musculoskeletal: Multiple lytic bone metastases scattered in the visualized spine and sacrum, increased. Representative expansile right L4 vertebral 6.4 x 5.7 cm lesion (series 12/image 48), increased from 6.0 x 4.9 cm. Representative left upper sacral 3.4 x 3.0 cm lesion (series 12/image 63), not previously imaged.  Represent of left T12 2.9 x 2.8 cm lesion (series 12/image 23), increased from 2.4 x 2.3 cm. Marked lumbar spondylosis. IMPRESSION: 1. Probable acute pulmonary embolus partially visualized at the right lung base. Further evaluation with PE protocol CT chest angiogram may be obtained as clinically warranted. 2. Widespread liver metastases, increased. 3. Multiple lytic bone metastases, increased. 4. Aortic Atherosclerosis (ICD10-I70.0) and Emphysema (ICD10-J43.9). Critical Value/emergent results were called by telephone at the time of interpretation on 07/18/2020 at 12:54 pm to provider Truitt Merle , who verbally acknowledged these results. Electronically Signed   By: Ilona Sorrel M.D.   On: 07/18/2020 13:18   DG Chest Port 1 View  Result Date: 07/25/2020 CLINICAL DATA:  Abdominal hemorrhage EXAM: PORTABLE CHEST 1 VIEW COMPARISON:  Abdominal CT from yesterday. FINDINGS: Streaky density at the bases attributed atelectasis superimposed on chronic interstitial coarsening. There is no edema, consolidation, effusion, or pneumothorax. Normal heart size and mediastinal contours. IMPRESSION: Atelectasis at the bases as seen on preceding abdominal CT. Electronically Signed   By: Monte Fantasia M.D.   On: 07/25/2020 05:58    Lab Data:  CBC: Recent Labs  Lab 07/19/20 0651 07/19/20 0651 07/24/20 0709 07/24/20 0854 07/24/20 1430 07/24/20 2100 07/25/20 0207  WBC 6.2   < > 9.5 8.7 6.2 7.2 6.9  NEUTROABS 3.5  --  6.9  --   --   --   --   HGB 10.2*   < > 8.2* 6.5* 8.0* 8.8* 9.0*  HCT 29.4*   < > 26.9* 20.5* 24.8* 27.6* 28.8*  MCV 88.0   < > 96.8 95.3 91.9 91.7 93.2  PLT 218   < > 260 241 168 179 175   < > = values in this interval not displayed.   Basic Metabolic Panel: Recent Labs  Lab 07/19/20 0651 07/24/20 0709 07/25/20 0207  NA 137 140 137  K 4.0 4.5 4.2  CL 104 103 104  CO2 22 21* 21*  GLUCOSE 99 178* 82  BUN 14 24* 24*  CREATININE 1.21 3.03* 1.75*  CALCIUM 9.4 9.9 9.0  MG  --   --  1.5*    PHOS  --   --  3.4   GFR: Estimated Creatinine Clearance: 41.4 mL/min (A) (by C-G formula based on SCr of 1.75 mg/dL (H)). Liver Function Tests: Recent Labs  Lab 07/19/20 0651 07/24/20 0709  AST 183* 204*  ALT 71* 79*  ALKPHOS 285* 282*  BILITOT 1.8* 2.1*  PROT 8.0 8.0  ALBUMIN 3.2* 3.3*   Recent Labs  Lab 07/24/20 0709  LIPASE 39   No results for input(s): AMMONIA in the last 168 hours. Coagulation Profile: Recent Labs  Lab 07/24/20 0810  INR 1.5*   Cardiac Enzymes: No results for input(s): CKTOTAL, CKMB, CKMBINDEX, TROPONINI in the last 168 hours. BNP (last 3 results) No results for input(s): PROBNP in the last 8760 hours. HbA1C: No results for input(s): HGBA1C in the last 72 hours. CBG: No results for input(s): GLUCAP in the last 168 hours. Lipid Profile: No results for input(s): CHOL, HDL, LDLCALC, TRIG, CHOLHDL, LDLDIRECT in the last 72 hours. Thyroid Function Tests: No results for input(s): TSH, T4TOTAL, FREET4, T3FREE, THYROIDAB in the last 72 hours. Anemia Panel: No results for input(s): VITAMINB12, FOLATE, FERRITIN, TIBC, IRON, RETICCTPCT in the last 72 hours. Urine analysis:    Component Value Date/Time   COLORURINE YELLOW (A) 05/25/2018 1119   APPEARANCEUR CLEAR (A) 05/25/2018 1119   LABSPEC 1.016 05/25/2018 1119   PHURINE 5.0 05/25/2018 1119   GLUCOSEU NEGATIVE 05/25/2018 1119   HGBUR NEGATIVE 05/25/2018 1119   BILIRUBINUR NEGATIVE 05/25/2018 1119   KETONESUR NEGATIVE 05/25/2018 1119   PROTEINUR NEGATIVE 05/25/2018 1119   NITRITE NEGATIVE 05/25/2018 1119   LEUKOCYTESUR NEGATIVE 05/25/2018 1119     Elizibeth Breau M.D. [facility] 07/25/2020, 11:23 AM   Call night coverage person covering after 7pm

## 2020-07-25 NOTE — Progress Notes (Signed)
Bilateral lower extremity venous duplex has been completed. Preliminary results can be found in CV Proc through chart review.   07/25/20 11:54 AM Carlos Levering RVT

## 2020-07-25 NOTE — Progress Notes (Addendum)
Vernon [name]   DOB:01-06-1961   DQ#:[id number]   [id number]#:[id number]  Oncology follow up  Subjective: The patient has no specific complaints today.  He has not noticed any recurrent bleeding.  No abdominal pain, nausea, vomiting.  Remains afebrile.  Objective:  Vitals:   07/25/20 0600 07/25/20 0816  BP: 129/68   Pulse: 92   Resp: 15   Temp:  98.2 F (36.8 C)  SpO2: 95%     Body mass index is 23.41 kg/m.  Intake/Output Summary (Last 24 hours) at 07/25/2020 0923 Last data filed at 07/24/2020 2152 Gross per 24 hour  Intake 435 ml  Output 350 ml  Net 85 ml     Sclerae unicteric  Alert and oriented   Neuro nonfocal    CBG (last 3)  No results for input(s): GLUCAP in the last 72 hours.   Labs:   Urine Studies No results for input(s): UHGB, CRYS in the last 72 hours.  Invalid input(s): UACOL, UAPR, USPG, UPH, UTP, UGL, UKET, UBIL, UNIT, UROB, ULEU, UEPI, UWBC, URBC, UBAC, CAST, [place], [place]  Basic Metabolic Panel: Recent Labs  Lab 07/19/20 0651 07/19/20 0651 07/24/20 0709 07/25/20 0207  NA 137  --  140 137  K 4.0   < > 4.5 4.2  CL 104  --  103 104  CO2 22  --  21* 21*  GLUCOSE 99  --  178* 82  BUN 14  --  24* 24*  CREATININE 1.21  --  3.03* 1.75*  CALCIUM 9.4  --  9.9 9.0  MG  --   --   --  1.5*  PHOS  --   --   --  3.4   < > = values in this interval not displayed.   GFR Estimated Creatinine Clearance: 41.4 mL/min (A) (by C-G formula based on SCr of 1.75 mg/dL (H)). Liver Function Tests: Recent Labs  Lab 07/19/20 0651 07/24/20 0709  AST 183* 204*  ALT 71* 79*  ALKPHOS 285* 282*  BILITOT 1.8* 2.1*  PROT 8.0 8.0  ALBUMIN 3.2* 3.3*   Recent Labs  Lab 07/24/20 0709  LIPASE 39   No results for input(s): AMMONIA in the last 168 hours. Coagulation profile Recent Labs  Lab 07/24/20 0810  INR 1.5*    CBC: Recent Labs  Lab 07/19/20 0651 07/19/20 0651 07/24/20 0709 07/24/20 0854 07/24/20 1430 07/24/20 2100 07/25/20 0207  WBC 6.2   < > 9.5 8.7  6.2 7.2 6.9  NEUTROABS 3.5  --  6.9  --   --   --   --   HGB 10.2*   < > 8.2* 6.5* 8.0* 8.8* 9.0*  HCT 29.4*   < > 26.9* 20.5* 24.8* 27.6* 28.8*  MCV 88.0   < > 96.8 95.3 91.9 91.7 93.2  PLT 218   < > 260 241 168 179 175   < > = values in this interval not displayed.   Cardiac Enzymes: No results for input(s): CKTOTAL, CKMB, CKMBINDEX, TROPONINI in the last 168 hours. BNP: Invalid input(s): POCBNP CBG: No results for input(s): GLUCAP in the last 168 hours. D-Dimer No results for input(s): DDIMER in the last 72 hours. Hgb A1c No results for input(s): HGBA1C in the last 72 hours. Lipid Profile No results for input(s): CHOL, HDL, LDLCALC, TRIG, CHOLHDL, LDLDIRECT in the last 72 hours. Thyroid function studies No results for input(s): TSH, T4TOTAL, T3FREE, THYROIDAB in the last 72 hours.  Invalid input(s): FREET3 Anemia work up No results for  input(s): VITAMINB12, FOLATE, FERRITIN, TIBC, IRON, RETICCTPCT in the last 72 hours. Microbiology Recent Results (from the past 240 hour(s))  Blood culture (routine x 2)     Status: None (Preliminary result)   Collection Time: 07/24/20  8:10 AM   Specimen: BLOOD RIGHT HAND  Result Value Ref Range Status   Specimen Description   Final    BLOOD RIGHT HAND Performed at [facility], [place] [address] [place], [place] [address]    Special Requests   Final    BOTTLES DRAWN AEROBIC AND ANAEROBIC Blood Culture results may not be optimal due to an inadequate volume of blood received in culture bottles Performed at [facility] [address], [place], [place] [address]    Culture   Final    NO GROWTH < 24 HOURS Performed at [facility] [address], [place], [place] [address]    Report Status PENDING  Incomplete  Blood culture (routine x 2)     Status: None (Preliminary result)   Collection Time: 07/24/20  8:15 AM   Specimen: BLOOD  Result Value Ref Range Status   Specimen Description   Final     BLOOD LEFT WRIST Performed at [facility] [address] [place], [place] [address]    Special Requests   Final    BOTTLES DRAWN AEROBIC ONLY Blood Culture adequate volume Performed at [facility] [address], [place], [place] [address]    Culture   Final    NO GROWTH < 24 HOURS Performed at [facility] [address], [place], [place] [address]    Report Status PENDING  Incomplete  SARS Coronavirus 2 by RT PCR (hospital order, performed in [facility] hospital lab) Nasopharyngeal Nasopharyngeal Swab     Status: None   Collection Time: 07/24/20  8:17 AM   Specimen: Nasopharyngeal Swab  Result Value Ref Range Status   SARS Coronavirus 2 NEGATIVE NEGATIVE Final    Comment: (NOTE) SARS-CoV-2 target nucleic acids are NOT DETECTED.  The SARS-CoV-2 RNA is generally detectable in upper and lower respiratory specimens during the acute phase of infection. The lowest concentration of SARS-CoV-2 viral copies this assay can detect is 250 copies / mL. A negative result does not preclude SARS-CoV-2 infection and should not be used as the sole basis for treatment or other patient management decisions.  A negative result may occur with improper specimen collection / handling, submission of specimen other than nasopharyngeal swab, presence of viral mutation(s) within the areas targeted by this assay, and inadequate number of viral copies (<250 copies / mL). A negative result must be combined with clinical observations, patient history, and epidemiological information.  Fact Sheet for Patients:   StrictlyIdeas.no  Fact Sheet for Healthcare Providers: BankingDealers.co.za  This test is not yet approved or  cleared by the [place] FDA and has been authorized for detection and/or diagnosis of SARS-CoV-2 by FDA under an Emergency Use Authorization (EUA).  This EUA will remain in effect  (meaning this test can be used) for the duration of the COVID-19 declaration under Section 564(b)(1) of the Act, 21 U.S.C. section 360bbb-3(b)(1), unless the authorization is terminated or revoked sooner.  Performed at [facility], [place] [address], [place],  [address]   MRSA PCR Screening     Status: None   Collection Time: 07/24/20  5:19 PM   Specimen: Nasopharyngeal  Result Value Ref Range Status   MRSA by  PCR NEGATIVE NEGATIVE Final    Comment:        The GeneXpert MRSA Assay (FDA approved for NASAL specimens only), is one component of a comprehensive MRSA colonization surveillance program. It is not intended to diagnose MRSA infection nor to guide or monitor treatment for MRSA infections. Performed at [facility], [place] [address] [place], [place] [address]       Studies:  CT ABDOMEN PELVIS WO CONTRAST  Result Date: 07/24/2020 CLINICAL DATA:  Acute generalized abdominal pain and distention. History of hepatocellular carcinoma. EXAM: CT ABDOMEN AND PELVIS WITHOUT CONTRAST TECHNIQUE: Multidetector CT imaging of the abdomen and pelvis was performed following the standard protocol without IV contrast. COMPARISON:  July 18, 2020. FINDINGS: Lower chest: Mild bibasilar subsegmental atelectasis is noted. Hepatobiliary: No gallstones or biliary dilatation is noted. Multiple rounded hypoechoic lesions are noted throughout the liver consistent with metastatic disease. Pancreas: Unremarkable. No pancreatic ductal dilatation or surrounding inflammatory changes. Spleen: Normal in size without focal abnormality. Adrenals/Urinary Tract: Adrenal glands are unremarkable. Kidneys are normal, without renal calculi, focal lesion, or hydronephrosis. Bladder is unremarkable. Stomach/Bowel: The stomach is unremarkable. There is no evidence of bowel obstruction or inflammation the appendix appears normal. Vascular/Lymphatic: Aortic atherosclerosis. No  enlarged abdominal or pelvic lymph nodes. Reproductive: Prostate is unremarkable. Other: There is the interval development of high density fluid in the pelvis most consistent with hemorrhage. There also appears to be complex hyperdense abnormality seen anteriorly in the central portion of the abdomen, also consistent with intraperitoneal hematoma or hemorrhage. No hernia is noted. Hemorrhage is also noted inferior to the right hepatic lobe. Musculoskeletal: Large lytic destructive lesions are noted in the left sacrum, L1 and L4 vertebral bodies. The L4 lesion appears to extend into the right paraspinal soft tissues. IMPRESSION: 1. Interval development of high density fluid in the pelvis most consistent with hemorrhage. There also appears to be complex hyperdense abnormality seen anteriorly in the central portion of the abdomen, also consistent with intraperitoneal hematoma or hemorrhage. Hemorrhage is also noted inferior to the right hepatic lobe. Critical Value/emergent results were called by telephone at the time of interpretation on 07/24/2020 at 8:46 am to provider ADAM CURATOLO , who verbally acknowledged these results. 2. Multiple rounded hypoechoic lesions are noted throughout the liver consistent with metastatic disease. 3. Large lytic destructive lesions are noted in the left sacrum, L1 and L4 vertebral bodies consistent with metastatic disease. The L4 lesion appears to extend into the right paraspinal soft tissues. 4. Aortic atherosclerosis. Aortic Atherosclerosis (ICD10-I70.0). Electronically Signed   By: Marijo Conception M.D.   On: 07/24/2020 08:46   DG Chest Port 1 View  Result Date: 07/25/2020 CLINICAL DATA:  Abdominal hemorrhage EXAM: PORTABLE CHEST 1 VIEW COMPARISON:  Abdominal CT from yesterday. FINDINGS: Streaky density at the bases attributed atelectasis superimposed on chronic interstitial coarsening. There is no edema, consolidation, effusion, or pneumothorax. Normal heart size and mediastinal  contours. IMPRESSION: Atelectasis at the bases as seen on preceding abdominal CT. Electronically Signed   By: Monte Fantasia M.D.   On: 07/25/2020 05:58    Assessment: 59 y.o.  Male   1.  Anemia secondary to blood loss 2.  Intra-abdominal hematoma 3.  Recurrent PE, on eliquis  4.  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  Liver cirrhosis and hepatitis C 7. AKI   Plan:  -Denies recurrent bleeding.  Hemoglobin  is stable this morning.  Recommend close monitoring of CBC. -Continue to hold anticoagulation due to severe bleeding.  Unsure if he will be able to restart due to high risk of bleeding. -He has recurrence of acute PE, likely related to his underlying malignancy.  Bilateral Doppler ultrasound ordered and is currently pending.  IVC filter is being considered but unclear how beneficial this will be. -We recently discussed switching his cancer treatment to immunotherapy and bevacizumab due to his recent cancer progression.  Unfortunately bevacizumab will be contraindicated due to his PE and bleeding.  He also developed worsening renal function and AKI this admission.  Renal function improving, but not yet back to baseline.  Recommend rechecking liver functions admission.  If the patient is able to recover well enough, may consider immunotherapy as a single agent. -I will continue f/u in hospital.    Mikey Bussing, NP 07/25/2020    Addendum  I have seen the patient, examined him. I agree with the assessment and and plan and have edited the notes.   Pt complains intermittent severe abdominal pain, and oxycodone 5mg  is not enough.  He was on 10 mg as needed at home.  I took the liberty to increase his oxycodone.  5 to 10 mg as needed every 4 hours. He is also on oxyCODONE ER (XTAMPZA ER) 13.5 MG Q12H at home, I will order and increase to 20mg  q12h. The pain is likely related  to his liver cancer and intra-abdominal bleeding.  His hemoglobin has been stable in the past 24 hours, his bleeding likely stopped.  I again reviewed the overall poor prognosis of his metastatic liver cancer, due to the recent cancer progression, worsening liver function, and other complications including tumor bleeding, recurrent pulmonary embolism AKI, etc.  I discussed that immunotherapy is probably still an option, if he recovers well, he would not be a candidate for Avastin.  We again reviewed the goal of cancer treatment is palliative.  He states he has not thought about what he wanted to if he is not a candidate for more cancer treatment.  We discussed CODE STATUS, I recommend DNR/DNI, he will think about it, but not ready to make a decision at this point.  He remains to be full code now.   Will continue f/u. Please titrate his pain meds, consult palliative care if needed, continue hold anticoagulation, f/u H/H closely.   Truitt Merle  07/25/2020

## 2020-07-25 NOTE — TOC Initial Note (Signed)
Transition of Care [facility]) - Initial/Assessment Note    Patient Details  Name: Vernon [name] MRN: [id number] Date of Birth: 28-Jul-1961  [facility] CM/SW Contact:    Leeroy Cha, RN Phone Number: 07/25/2020, 9:43 AM  Clinical Narrative:                 Patient is from home lives by self does have family support group in the area. ABd pain and hemorrhage by ctscan, hgb 8.0 and given one unit of prbc, recently started on Eliquis for a P.Embolus.  Hx of advance liver Ca. Following for progression and toc needs/plan is to return to home with self care once bleeding is stopped and patient stable.  Expected Discharge Plan: Home/Self Care Barriers to Discharge: Continued Medical Work up   Patient Goals and CMS Choice Patient states their goals for this hospitalization and ongoing recovery are:: to go home CMS Medicare.gov Compare Post Acute Care list provided to:: Patient    Expected Discharge Plan and Services Expected Discharge Plan: Home/Self Care   Discharge Planning Services: CM Consult   Living arrangements for the past 2 months: Single Family Home                                      Prior Living Arrangements/Services Living arrangements for the past 2 months: Single Family Home Lives with:: Self Patient language and need for interpreter reviewed:: Yes Do you feel safe going back to the place where you live?: Yes      Need for Family Participation in Patient Care: Yes (Comment) Care giver support system in place?: Yes (comment)   Criminal Activity/Legal Involvement Pertinent to Current Situation/Hospitalization: No - Comment as needed  Activities of Daily Living Home Assistive Devices/Equipment: Other (Comment) (walk-in shower, standard toilet) ADL Screening (condition at time of admission) Patient's cognitive ability adequate to safely complete daily activities?: Yes Is the patient deaf or have difficulty hearing?: No Does the patient have  difficulty seeing, even when wearing glasses/contacts?: No Does the patient have difficulty concentrating, remembering, or making decisions?: No Patient able to express need for assistance with ADLs?: Yes Does the patient have difficulty dressing or bathing?: Yes Independently performs ADLs?: No Communication: Independent Dressing (OT): Needs assistance Is this a change from baseline?: Change from baseline, expected to last >3 days Grooming: Needs assistance Is this a change from baseline?: Change from baseline, expected to last >3 days Feeding: Needs assistance Is this a change from baseline?: Change from baseline, expected to last >3 days Bathing: Needs assistance Is this a change from baseline?: Change from baseline, expected to last >3 days Toileting: Needs assistance Is this a change from baseline?: Change from baseline, expected to last >3days In/Out Bed: Needs assistance Is this a change from baseline?: Change from baseline, expected to last >3 days Walks in Home: Needs assistance Is this a change from baseline?: Change from baseline, expected to last >3 days Does the patient have difficulty walking or climbing stairs?: Yes (secondary to weakness and pain) Weakness of Legs: Both Weakness of Arms/Hands: None  Permission Sought/Granted                  Emotional Assessment Appearance:: Appears stated age Attitude/Demeanor/Rapport: Engaged Affect (typically observed): Calm Orientation: : Oriented to Place, Oriented to Self, Oriented to  Time, Oriented to Situation Alcohol / Substance Use: Not Applicable Psych Involvement: No (comment)  Admission diagnosis:  AKI (acute kidney injury) ([place]) [N17.9] Intraperitoneal hemorrhage [K66.1] Abdominal pain [R10.9] Patient Active Problem List   Diagnosis Date Noted  . Abdominal pain 07/24/2020  . AKI (acute kidney injury) ([place])   . Intraperitoneal hemorrhage   . Bone metastasis ([place]) 04/30/2020  . Iron deficiency anemia due  to chronic blood loss 04/26/2020  . Palliative care by specialist   . Right hip pain   . Pain of metastatic malignancy   . Pulmonary embolism without acute cor pulmonale ([place]) 04/25/2020  . 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 Right thigh pain 04/25/2020  . Anemia 04/25/2020  . Acute pulmonary embolism without acute cor pulmonale ([place]) 04/25/2020  . Fatigue 04/25/2020  . Goals of care, counseling/discussion 10/11/2018  . Hepatitis 10/11/2018  . Hepatitis C virus infection without hepatic coma 08/12/2018  . Hepatocellular carcinoma ([place]) 07/29/2018  . Abnormal iron saturation 07/29/2018   PCP:  Lorelee Market, MD Pharmacy:   [facility] [phone] [address], [place] [address] Phone: [phone] Fax: [place], [place] - [address] [place] [address] Phone: [phone] Fax: [phone]  [facility] [place], [place] - [address] [place] [address] Phone: [phone] Fax: [phone]     Social Determinants of Health (SDOH) Interventions    Readmission Risk Interventions No flowsheet data found.

## 2020-07-26 ENCOUNTER — Inpatient Hospital Stay (HOSPITAL_COMMUNITY): Payer: Medicaid Other

## 2020-07-26 ENCOUNTER — Encounter (HOSPITAL_COMMUNITY): Payer: Self-pay | Admitting: Internal Medicine

## 2020-07-26 DIAGNOSIS — R1010 Upper abdominal pain, unspecified: Secondary | ICD-10-CM

## 2020-07-26 HISTORY — PX: IR ANGIOGRAM VISCERAL SELECTIVE: IMG657

## 2020-07-26 HISTORY — PX: IR US GUIDE VASC ACCESS RIGHT: IMG2390

## 2020-07-26 HISTORY — PX: IR IVC FILTER PLMT / S&I /IMG GUID/MOD SED: IMG701

## 2020-07-26 HISTORY — PX: IR ANGIOGRAM SELECTIVE EACH ADDITIONAL VESSEL: IMG667

## 2020-07-26 HISTORY — PX: IR EMBO ART  VEN HEMORR LYMPH EXTRAV  INC GUIDE ROADMAPPING: IMG5450

## 2020-07-26 LAB — CBC
HCT: 20.5 % — ABNORMAL LOW (ref 39.0–52.0)
Hemoglobin: 6.5 g/dL — CL (ref 13.0–17.0)
MCH: 30 pg (ref 26.0–34.0)
MCHC: 31.7 g/dL (ref 30.0–36.0)
MCV: 94.5 fL (ref 80.0–100.0)
Platelets: 210 10*3/uL (ref 150–400)
RBC: 2.17 MIL/uL — ABNORMAL LOW (ref 4.22–5.81)
RDW: 19 % — ABNORMAL HIGH (ref 11.5–15.5)
WBC: 7.7 10*3/uL (ref 4.0–10.5)
nRBC: 0.3 % — ABNORMAL HIGH (ref 0.0–0.2)

## 2020-07-26 LAB — MAGNESIUM: Magnesium: 2.6 mg/dL — ABNORMAL HIGH (ref 1.7–2.4)

## 2020-07-26 LAB — HEPATIC FUNCTION PANEL
ALT: 96 U/L — ABNORMAL HIGH (ref 0–44)
AST: 252 U/L — ABNORMAL HIGH (ref 15–41)
Albumin: 2.7 g/dL — ABNORMAL LOW (ref 3.5–5.0)
Alkaline Phosphatase: 230 U/L — ABNORMAL HIGH (ref 38–126)
Bilirubin, Direct: 1.7 mg/dL — ABNORMAL HIGH (ref 0.0–0.2)
Indirect Bilirubin: 1 mg/dL — ABNORMAL HIGH (ref 0.3–0.9)
Total Bilirubin: 2.7 mg/dL — ABNORMAL HIGH (ref 0.3–1.2)
Total Protein: 6.6 g/dL (ref 6.5–8.1)

## 2020-07-26 LAB — HEMOGLOBIN AND HEMATOCRIT, BLOOD
HCT: 24.5 % — ABNORMAL LOW (ref 39.0–52.0)
Hemoglobin: 8 g/dL — ABNORMAL LOW (ref 13.0–17.0)

## 2020-07-26 LAB — PREPARE RBC (CROSSMATCH)

## 2020-07-26 LAB — BASIC METABOLIC PANEL
Anion gap: 12 (ref 5–15)
BUN: 32 mg/dL — ABNORMAL HIGH (ref 8–23)
CO2: 21 mmol/L — ABNORMAL LOW (ref 22–32)
Calcium: 8.8 mg/dL — ABNORMAL LOW (ref 8.9–10.3)
Chloride: 105 mmol/L (ref 98–111)
Creatinine, Ser: 2.21 mg/dL — ABNORMAL HIGH (ref 0.61–1.24)
GFR calc Af Amer: 36 mL/min — ABNORMAL LOW (ref >60)
GFR calc non Af Amer: 31 mL/min — ABNORMAL LOW (ref >60)
Glucose, Bld: 108 mg/dL — ABNORMAL HIGH (ref 70–99)
Potassium: 4.9 mmol/L (ref 3.5–5.1)
Sodium: 138 mmol/L (ref 135–145)

## 2020-07-26 MED ORDER — MIDAZOLAM HCL 2 MG/2ML IJ SOLN
INTRAMUSCULAR | Status: AC | PRN
  Administered 2020-07-26 (×2): 1 mg via INTRAVENOUS

## 2020-07-26 MED ORDER — IOHEXOL 350 MG/ML SOLN
75.0000 mL | Freq: Once | INTRAVENOUS | Status: AC | PRN
  Administered 2020-07-26: 75 mL via INTRAVENOUS

## 2020-07-26 MED ORDER — FENTANYL CITRATE (PF) 100 MCG/2ML IJ SOLN
INTRAMUSCULAR | Status: AC | PRN
  Administered 2020-07-26 (×2): 50 ug via INTRAVENOUS

## 2020-07-26 MED ORDER — FENTANYL CITRATE (PF) 100 MCG/2ML IJ SOLN
INTRAMUSCULAR | Status: AC
Start: 2020-07-26 — End: 2020-07-27
  Filled 2020-07-26: qty 2

## 2020-07-26 MED ORDER — PIPERACILLIN-TAZOBACTAM 3.375 G IVPB 30 MIN: 3.3750 g | Freq: Once | INTRAVENOUS | Status: AC

## 2020-07-26 MED ORDER — ONDANSETRON HCL 4 MG/2ML IJ SOLN
INTRAMUSCULAR | Status: AC
  Filled 2020-07-26: qty 2

## 2020-07-26 MED ORDER — IODIXANOL 320 MG/ML IV SOLN
50.0000 mL | Freq: Once | INTRAVENOUS | Status: AC | PRN
  Administered 2020-07-26: 30 mL via INTRAVENOUS

## 2020-07-26 MED ORDER — LIDOCAINE-EPINEPHRINE 1 %-1:100000 IJ SOLN
INTRAMUSCULAR | Status: AC | PRN
  Administered 2020-07-26: 10 mL

## 2020-07-26 MED ORDER — IODIXANOL 320 MG/ML IV SOLN
50.0000 mL | Freq: Once | INTRAVENOUS | Status: AC | PRN
  Administered 2020-07-26: 10 mL via INTRA_ARTERIAL

## 2020-07-26 MED ORDER — LIDOCAINE HCL 1 % IJ SOLN
INTRAMUSCULAR | Status: AC
  Filled 2020-07-26: qty 20

## 2020-07-26 MED ORDER — PIPERACILLIN-TAZOBACTAM 3.375 G IVPB
INTRAVENOUS | Status: AC
  Administered 2020-07-26: 3.375 g via INTRAVENOUS
  Filled 2020-07-26: qty 50

## 2020-07-26 MED ORDER — SODIUM CHLORIDE (PF) 0.9 % IJ SOLN
INTRAMUSCULAR | Status: AC
  Filled 2020-07-26: qty 50

## 2020-07-26 MED ORDER — ONDANSETRON HCL 4 MG/2ML IJ SOLN
INTRAMUSCULAR | Status: AC | PRN
  Administered 2020-07-26: 4 mg via INTRAVENOUS

## 2020-07-26 MED ORDER — SODIUM CHLORIDE 0.9% IV SOLUTION: Freq: Once | INTRAVENOUS | Status: AC

## 2020-07-26 MED ORDER — MIDAZOLAM HCL 2 MG/2ML IJ SOLN
INTRAMUSCULAR | Status: AC
  Filled 2020-07-26: qty 4

## 2020-07-26 NOTE — Progress Notes (Signed)
Patient ID: Vernon [name], male   DOB: 1961-06-01, 59 y.o.   MRN: [id number] Pt's latest CT angio A/P study reviewed by Dr. Earleen Newport. Findings noted of:  1. Increased and now moderate volume Hemoperitoneum throughout the abdomen and pelvis. Given hyperdense clot along the undersurface of the liver, favor bleeding hepatic tumor as the source of hemorrhage.  2. Grossly stable infiltrative liver tumor and complicated spinal metastatic disease since 07/24/2020.  3. Lung base Bronchiectasis and scarring. Aortic Atherosclerosis  Pt now scheduled for visceral/hepatic angio with embolization today along with IVC filter placement. Risks and benefits of procedure were discussed with the patient including, but not limited to bleeding, infection, vascular injury or contrast induced renal failure.  This interventional procedure involves the use of X-rays and because of the nature of the planned procedure, it is possible that we will have prolonged use of X-ray fluoroscopy.  Potential radiation risks to you include (but are not limited to) the following: - A slightly elevated risk for cancer  several years later in life. This risk is typically less than 0.5% percent. This risk is low in comparison to the normal incidence of human cancer, which is 33% for women and 50% for men according to the [place]. - Radiation induced injury can include skin redness, resembling a rash, tissue breakdown / ulcers and hair loss (which can be temporary or permanent).   The likelihood of either of these occurring depends on the difficulty of the procedure and whether you are sensitive to radiation due to previous procedures, disease, or genetic conditions.   IF your procedure requires a prolonged use of radiation, you will be notified and given written instructions for further action.  It is your responsibility to monitor the irradiated area for the 2 weeks following the procedure and to notify your physician  if you are concerned that you have suffered a radiation induced injury.    All of the patient's questions were answered, patient is agreeable to proceed.  Consent signed and in chart.

## 2020-07-26 NOTE — Progress Notes (Addendum)
Vernon [name]   DOB:11/14/1961   KX#:[id number]   JIR#:[id number]  Oncology follow up  Subjective: Daughter is at the bedside.  The patient is sitting up in the recliner.  Ports mild abdominal discomfort which is controlled with current pain medications.  Denies nausea and vomiting.  He has not noticed any bleeding.  Hemoglobin was down to 6.5 this morning and he received 1 unit PRBCs.  Objective:  Vitals:   07/26/20 0800 07/26/20 1000  BP: 134/67 (!) 165/80  Pulse: 89 (!) 102  Resp: 14 (!) 23  Temp:  97.8 F (36.6 C)  SpO2: 95% 97%    Body mass index is 24.14 kg/m.  Intake/Output Summary (Last 24 hours) at 07/26/2020 1115 Last data filed at 07/26/2020 1000 Gross per 24 hour  Intake 1162.55 ml  Output --  Net 1162.55 ml     Sclerae unicteric  Alert and oriented   Abdomen soft, mild tenderness over the right upper quadrant  Neuro nonfocal    CBG (last 3)  Recent Labs    07/25/20 1726  GLUCAP 108*     Labs:   Urine Studies No results for input(s): UHGB, CRYS in the last 72 hours.  Invalid input(s): UACOL, UAPR, USPG, UPH, UTP, UGL, UKET, UBIL, UNIT, UROB, ULEU, UEPI, UWBC, Junie Panning [place], [place], [place]  Basic Metabolic Panel: Recent Labs  Lab 07/24/20 0709 07/24/20 0709 07/25/20 0207 07/26/20 0257  NA 140  --  137 138  K 4.5   < > 4.2 4.9  CL 103  --  104 105  CO2 21*  --  21* 21*  GLUCOSE 178*  --  82 108*  BUN 24*  --  24* 32*  CREATININE 3.03*  --  1.75* 2.21*  CALCIUM 9.9  --  9.0 8.8*  MG  --   --  1.5* 2.6*  PHOS  --   --  3.4  --    < > = values in this interval not displayed.   GFR Estimated Creatinine Clearance: 32.8 mL/min (A) (by C-G formula based on SCr of 2.21 mg/dL (H)). Liver Function Tests: Recent Labs  Lab 07/24/20 0709 07/26/20 0257  AST 204* 252*  ALT 79* 96*  ALKPHOS 282* 230*  BILITOT 2.1* 2.7*  PROT 8.0 6.6  ALBUMIN 3.3* 2.7*   Recent Labs  Lab 07/24/20 0709  LIPASE 39   No results for input(s): AMMONIA in the last  168 hours. Coagulation profile Recent Labs  Lab 07/24/20 0810  INR 1.5*    CBC: Recent Labs  Lab 07/24/20 0709 07/24/20 0854 07/24/20 1430 07/24/20 1430 07/24/20 2100 07/24/20 2100 07/25/20 0207 07/25/20 1355 07/25/20 1926 07/26/20 0257 07/26/20 1107  WBC 9.5   < > 6.2  --  7.2  --  6.9 8.0  --  7.7  --   NEUTROABS 6.9  --   --   --   --   --   --   --   --   --   --   HGB 8.2*   < > 8.0*   < > 8.8*   < > 9.0* 7.7* 7.7* 6.5* 8.0*  HCT 26.9*   < > 24.8*   < > 27.6*   < > 28.8* 24.4* 24.1* 20.5* 24.5*  MCV 96.8   < > 91.9  --  91.7  --  93.2 93.1  --  94.5  --   PLT 260   < > 168  --  179  --  175 207  --  210  --    < > = values in this interval not displayed.   Cardiac Enzymes: No results for input(s): CKTOTAL, CKMB, CKMBINDEX, TROPONINI in the last 168 hours. BNP: Invalid input(s): POCBNP CBG: Recent Labs  Lab 07/25/20 1726  GLUCAP 108*   D-Dimer No results for input(s): DDIMER in the last 72 hours. Hgb A1c No results for input(s): HGBA1C in the last 72 hours. Lipid Profile No results for input(s): CHOL, HDL, LDLCALC, TRIG, CHOLHDL, LDLDIRECT in the last 72 hours. Thyroid function studies No results for input(s): TSH, T4TOTAL, T3FREE, THYROIDAB in the last 72 hours.  Invalid input(s): FREET3 Anemia work up No results for input(s): VITAMINB12, FOLATE, FERRITIN, TIBC, IRON, RETICCTPCT in the last 72 hours. Microbiology Recent Results (from the past 240 hour(s))  Blood culture (routine x 2)     Status: None (Preliminary result)   Collection Time: 07/24/20  8:10 AM   Specimen: BLOOD RIGHT HAND  Result Value Ref Range Status   Specimen Description   Final    BLOOD RIGHT HAND Performed at [facility], [place] [address], [place], [place] [address]    Special Requests   Final    BOTTLES DRAWN AEROBIC AND ANAEROBIC Blood Culture results may not be optimal due to an inadequate volume of blood received in culture bottles Performed at [facility] [address] [place], [place] [address]    Culture   Final    NO GROWTH 2 DAYS Performed at [facility] [address] [place], [place] [address]    Report Status PENDING  Incomplete  Blood culture (routine x 2)     Status: None (Preliminary result)   Collection Time: 07/24/20  8:15 AM   Specimen: BLOOD  Result Value Ref Range Status   Specimen Description   Final    BLOOD LEFT WRIST Performed at [facility] [address] [place], [place] [address]    Special Requests   Final    BOTTLES DRAWN AEROBIC ONLY Blood Culture adequate volume Performed at [facility] [address], [place], [place] [address]    Culture   Final    NO GROWTH 2 DAYS Performed at [facility] [address] [place], [place] [address]    Report Status PENDING  Incomplete  SARS Coronavirus 2 by RT PCR (hospital order, performed in [facility] hospital lab) Nasopharyngeal Nasopharyngeal Swab     Status: None   Collection Time: 07/24/20  8:17 AM   Specimen: Nasopharyngeal Swab  Result Value Ref Range Status   SARS Coronavirus 2 NEGATIVE NEGATIVE Final    Comment: (NOTE) SARS-CoV-2 target nucleic acids are NOT DETECTED.  The SARS-CoV-2 RNA is generally detectable in upper and lower respiratory specimens during the acute phase of infection. The lowest concentration of SARS-CoV-2 viral copies this assay can detect is 250 copies / mL. A negative result does not preclude SARS-CoV-2 infection and should not be used as the sole basis for treatment or other patient management decisions.  A negative result may occur with improper specimen collection / handling, submission of specimen other than nasopharyngeal swab, presence of viral mutation(s) within the areas targeted by this assay, and inadequate number of viral copies (<250 copies / mL). A negative result must be combined with clinical observations, patient history, and  epidemiological information.  Fact Sheet for Patients:   StrictlyIdeas.no  Fact Sheet for Healthcare Providers: BankingDealers.co.za  This test is not yet approved or  cleared by the [place] and has been authorized for detection and/or diagnosis of SARS-CoV-2 by FDA under an Emergency Use Authorization (EUA).  This EUA will remain in effect (meaning this test can be used) for the duration of the COVID-19 declaration under Section 564(b)(1) of the Act, 21 U.S.C. section 360bbb-3(b)(1), unless the authorization is terminated or revoked sooner.  Performed at [facility], [place] [address] [place], [place] [address]   MRSA PCR Screening     Status: None   Collection Time: 07/24/20  5:19 PM   Specimen: Nasopharyngeal  Result Value Ref Range Status   MRSA by PCR NEGATIVE NEGATIVE Final    Comment:        The GeneXpert MRSA Assay (FDA approved for NASAL specimens only), is one component of a comprehensive MRSA colonization surveillance program. It is not intended to diagnose MRSA infection nor to guide or monitor treatment for MRSA infections. Performed at [facility], [place] [address], [place], [place] [address]       Studies:  DG Chest Port 1 View  Result Date: 07/25/2020 CLINICAL DATA:  Abdominal hemorrhage EXAM: PORTABLE CHEST 1 VIEW COMPARISON:  Abdominal CT from yesterday. FINDINGS: Streaky density at the bases attributed atelectasis superimposed on chronic interstitial coarsening. There is no edema, consolidation, effusion, or pneumothorax. Normal heart size and mediastinal contours. IMPRESSION: Atelectasis at the bases as seen on preceding abdominal CT. Electronically Signed   By: Monte Fantasia M.D.   On: 07/25/2020 05:58   VAS [place] LOWER EXTREMITY VENOUS (DVT)  Result Date: 07/25/2020  Lower Venous DVTStudy Indications: Pulmonary embolism.  Risk Factors: Confirmed PE.  Comparison Study: No prior studies. Performing Technologist: Oliver Hum RVT  Examination Guidelines: A complete evaluation includes B-mode imaging, spectral Doppler, color Doppler, and power Doppler as needed of all accessible portions of each vessel. Bilateral testing is considered an integral part of a complete examination. Limited examinations for reoccurring indications may be performed as noted. The reflux portion of the exam is performed with the patient in reverse Trendelenburg.  +---------+---------------+---------+-----------+----------+--------------+ RIGHT    CompressibilityPhasicitySpontaneityPropertiesThrombus Aging +---------+---------------+---------+-----------+----------+--------------+ CFV      Full           Yes      Yes                                 +---------+---------------+---------+-----------+----------+--------------+ SFJ      Full                                                        +---------+---------------+---------+-----------+----------+--------------+ FV Prox  Full                                                        +---------+---------------+---------+-----------+----------+--------------+ FV Mid   Full                                                        +---------+---------------+---------+-----------+----------+--------------+  FV DistalFull                                                        +---------+---------------+---------+-----------+----------+--------------+ PFV      Full                                                        +---------+---------------+---------+-----------+----------+--------------+ POP      Full           Yes      Yes                                 +---------+---------------+---------+-----------+----------+--------------+ PTV      Full                                                        +---------+---------------+---------+-----------+----------+--------------+ PERO      Full                                                        +---------+---------------+---------+-----------+----------+--------------+   +---------+---------------+---------+-----------+----------+--------------+ LEFT     CompressibilityPhasicitySpontaneityPropertiesThrombus Aging +---------+---------------+---------+-----------+----------+--------------+ CFV      Full           Yes      Yes                                 +---------+---------------+---------+-----------+----------+--------------+ SFJ      Full                                                        +---------+---------------+---------+-----------+----------+--------------+ FV Prox  Full                                                        +---------+---------------+---------+-----------+----------+--------------+ FV Mid   Full                                                        +---------+---------------+---------+-----------+----------+--------------+ FV DistalFull                                                        +---------+---------------+---------+-----------+----------+--------------+  PFV      Full                                                        +---------+---------------+---------+-----------+----------+--------------+ POP      Full           Yes      Yes                                 +---------+---------------+---------+-----------+----------+--------------+ PTV      Full                                                        +---------+---------------+---------+-----------+----------+--------------+ PERO     Full                                                        +---------+---------------+---------+-----------+----------+--------------+     Summary: RIGHT: - There is no evidence of deep vein thrombosis in the lower extremity.  - No cystic structure found in the popliteal fossa.  LEFT: - There is no evidence of deep vein thrombosis in the  lower extremity.  - No cystic structure found in the popliteal fossa.  *See table(s) above for measurements and observations. Electronically signed by Monica Martinez MD on 07/25/2020 at 3:24:16 PM.    Final     Assessment: 59 y.o.  Male   1.  Anemia secondary to blood loss 2.  Intra-abdominal hematoma 3.  Recurrent PE, on eliquis  4.  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  Liver cirrhosis and hepatitis C 7. AKI   Plan:  -Hemoglobin down to 6.5 this morning.  He is likely still bleeding.  Status post 1 unit PRBCs.  Recommend serial H&H and transfuse to keep hemoglobin above 8 in the setting of bleeding.  Repeat CT of the abdomen pelvis without contrast has been ordered and not yet formed.  The patient may benefit from embolization by IR if he is a candidate. -Continue to hold anticoagulation due to severe bleeding.  Unsure if he will be able to restart due to high risk of bleeding. -He has recurrence of acute PE, likely related to his underlying malignancy.  Bilateral Doppler ultrasound ordered and is currently pending.  IVC filter is being considered but unclear how beneficial this will be. -We recently discussed switching his cancer treatment to immunotherapy and bevacizumab due to his recent cancer progression.  Unfortunately bevacizumab will be contraindicated due to his PE and bleeding.  Renal and hepatic function are still not back to baseline.  If the patient is able to recover well enough, may consider immunotherapy as a single agent. -I will continue f/u in hospital.    Mikey Bussing, NP 07/26/2020    Addendum  I have seen the patient, examined him. I agree with the assessment and and plan  and have edited the notes.   His H/H has been dropping in the past few days, down to 6.5 last night, required blood transfusion again.  His abdominal pain has improved after  adjusting his pain medication.  This is concerning for continuous bleeding. I have spoken with IR Dr. Tresea Mall in length, I think CTA to see if he is a candidate for embolization would be reasonable, however this may cause worsening of his renal function from the CT contrast.  Dr. Earleen Newport is going to discuss with patient and his daughter.  We can also consider a CT noncontrast of abdomen and pelvis, to see if he has enlarging hematoma before proceed CTA.  I also agree with IVC filter placement after I talked to Dr. Earleen Newport.  Continue supportive care, blood transfusion to keep his hemoglobin above 7.5.   Truitt Merle  07/26/2020

## 2020-07-26 NOTE — Progress Notes (Signed)
Called by Dr Tana Coast, agree w/ contrasted imaging study for this patient w/ renal disease given the acuity/ severity of her other problems.  I will follow patient post contrast exposure.    Kelly Splinter, MD 07/26/2020, 1:20 PM

## 2020-07-26 NOTE — Progress Notes (Signed)
CRITICAL VALUE ALERT  Critical Value:  Hemoglobin= 6.5  Date & Time Notied: 07/26/20; 3:37AM  Provider Notified: paged [facility]/ A.Hal Hope MD  Orders Received/Actions taken: MD ordered to transfuse 1u of RBC

## 2020-07-26 NOTE — Progress Notes (Signed)
Palliative care consult received.   Chart reviewed.  Attempted to see patient this afternoon but off the floor for testing/procedure.  Will attempt initial visit again tomorrow.  Please call if there are urgent needs in the interim.  Micheline Rough, MD [facility] Team [phone]  NO CHARGE NOTE

## 2020-07-26 NOTE — Procedures (Addendum)
Interventional Radiology Procedure Note  Procedure:   US guided CFA for mesenteric angiogram, embolization of left hepatic arteries contributing to hemorrhaging left hepatic HCC.  Segment 2/3. To stasis.    Exoseal deployed for hemostasis.  [place] guided CFV for ivc cavogram and IVC filter placement.  Retrievable.    .  Complications: None  Recommendations:  - Recommend serial H&H - Follow liver enzymes.  - Follow renal function after 46cc contrast for angiogram/IVC filter - Right hip straight x 6 hours after CFA access - Do not submerge for 7 days - Routine wound care   Signed,  Dulcy Fanny. Earleen Newport, DO

## 2020-07-26 NOTE — Progress Notes (Signed)
[facility]                                                                              Patient Demographics  Vernon [name], is a 59 y.o. male, DOB - [birth year], ENI:[id number]  Admit date - 07/24/2020   Admitting Physician Brand Males, MD  Outpatient Primary MD for the patient is Lorelee Market, MD  Outpatient specialists:   LOS - 2  days   Medical records reviewed and are as summarized below:    No chief complaint on file.      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  On 8/20, patient was seen in ED for worsening shortness of breath, found to have acute PE, Eliquis was increased to 10 mg twice daily. In ED, creatinine 3.03, anion gap 16, alk phos 282, lactic acid 3, AST 204, hemoglobin 6.5, platelets 241 CT abdomen pelvis showed interval development of high density fluid in the pelvis consistent with hemorrhage complex hypodense abnormality anteriorly in the central portion of the abdomen consistent with intraperitoneal hematoma or hemorrhage.  Multiple lesions throughout the liver consistent with metastatic disease, large lytic destructive lesion in left sacrum L1 L4 vertebral bodies consistent with metastatic disease. Patient was hypotensive in ED with SBP of 83, was transfused 2 units packed RBC, BP improved, IR was consulted for possible embolization.  Patient was admitted by CCM to ICU.  Assessment & Plan    Active Problems:   Abdominal pain with Intraperitoneal hemorrhage in the setting of anticoagulation, hypotensive, hemorrhagic shock -Patient was on Eliquis, recently increased on 8/24 new PE presented with abdominal pain and found to have large intraperitoneal hemorrhage -Patient was transfused 2 units packed RBCs, anticoagulation held, admitted by CCM and IR was  consulted. -On 8/26 evening, had vasovagal episode, stat H&H was still stable at 7.7, felt better after IV fluid hydration.  This a.m. hemoglobin 6.5, transfusing 1 unit packed RBC -Stat CT abdomen pelvis showed increase in a moderate volume hemoperitoneum throughout the abdomen and pelvis given hyperdense clot along the undersurface of the liver, favored bleeding hepatic tumor as a source of hemorrhage, d/w Dr Nevada Crane.  - d/w Dr Burr Medico, Dr Earleen Newport (IR) and Dr Jonnie Finner, plan for IR embolization after CT angiogram, IVC filter.  Nephrology will follow for possible contrast nephropathy and worsening of the renal function  History of recent PE, coagulopathy on Eliquis -Eliquis now held, status post Kcentra reversal in the ED -Received 2units packed RBCs with FFP, transfuse 1 unit packed RBCs today  Acute blood loss anemia Hemoglobin 6.5 at the time of admission, baseline 8- 10, received 2 units packed RBC transfusion on admission -Hemoglobin 6.5 today, transfuse 1 unit of packed RBC    AKI (acute kidney injury) ([place]) with lactic acidosis -Due to acute blood loss anemia and hemorrhagic shock, baseline creatinine 1.2 -Creatinine on presentation 3.0, improving -Received 2 units packed RBCs on admission, 1 unit today, creatinine worsening today to 2.2 -Neurology consulted  Metastatic hepatocellular carcinoma -Followed by Dr. Mosetta Putt, initial diagnosis in 06/2018, metastatic, status post XRT, on chemotherapy, (lenvatinib), intermittently -Oncology consulted, seen by Dr. Mosetta Putt, had recently discussed switching his treatment to immunotherapy and bevacizumab, contraindicated due to PE and bleeding.  Overall prognosis poor   Chronic pain secondary to malignancy Continue pain control Palliative medicine consult for goals of care    Code Status: full  DVT Prophylaxis:   SCD's Family Communication: Discussed all imaging results, lab results, explained to the patient and daughter on phone    Disposition Plan:      Status is: Inpatient  Remains inpatient appropriate because:Inpatient level of care appropriate due to severity of illness   Dispo: The patient is from: Home              Anticipated d/c is to: TBD              Anticipated d/c date is: 2 days              Patient currently is not medically stable to d/c.     Time Spent in minutes 35 minutes  Procedures:  CT abd pelvis   Consultants:   Admitted by CCM Intervention radiology Oncology Interventional radiology Nephrology   Antimicrobials:   Anti-infectives (From admission, onward)   None         Medications  Scheduled Meds: . Chlorhexidine Gluconate Cloth  6 each Topical Daily  . mouth rinse  15 mL Mouth Rinse BID  . oxyCODONE  20 mg Oral Q12H  . sodium chloride (PF)       Continuous Infusions: . sodium chloride 100 mL/hr at 07/26/20 1200   PRN Meds:.docusate sodium, hydrALAZINE, ondansetron (ZOFRAN) IV, oxyCODONE, polyethylene glycol      Subjective:   Vernon [name] was seen and examined today.  At the time of my examination, patient receiving packed RBC transfusion.  Mild abdominal distention with pain.  No nausea or vomiting.  Patient denies dizziness, chest pain, shortness of breath. ,    Objective:   Vitals:   07/26/20 1000 07/26/20 1200 07/26/20 1201 07/26/20 1202  BP: (!) 165/80 139/81    Pulse: (!) 102 100    Resp: (!) 23 17    Temp: 97.8 F (36.6 C)  97.9 F (36.6 C) 97.9 F (36.6 C)  TempSrc: Oral  Oral Oral  SpO2: 97% 98%    Weight:      Height:        Intake/Output Summary (Last 24 hours) at 07/26/2020 1355 Last data filed at 07/26/2020 1200 Gross per 24 hour  Intake 1321.56 ml  Output --  Net 1321.56 ml     Wt Readings from Last 3 Encounters:  07/26/20 69.9 kg  07/19/20 70.8 kg  07/19/20 70.8 kg   Physical Exam  General: Alert and oriented x 3, NAD  Cardiovascular: S1 S2 clear, RRR. No pedal edema b/l  Respiratory: CTAB, no wheezing, rales or  rhonchi  Gastrointestinal: Soft, distended with mild diffuse TTP, NBS Ext: no pedal edema bilaterally  Neuro: no new deficits  Musculoskeletal: No cyanosis, clubbing  Skin: No rashes  Psych: Normal affect and demeanor, alert and oriented x3    Data Reviewed:  I have personally reviewed following labs and imaging studies  Micro Results Recent Results (from the past 240 hour(s))  Blood culture (routine x 2)     Status: None (Preliminary result)   Collection Time: 07/24/20  8:10 AM   Specimen: BLOOD RIGHT HAND  Result  Value Ref Range Status   Specimen Description   Final    BLOOD RIGHT HAND Performed at [facility] [address] [place], [place] [address]    Special Requests   Final    BOTTLES DRAWN AEROBIC AND ANAEROBIC Blood Culture results may not be optimal due to an inadequate volume of blood received in culture bottles Performed at [facility] [address] [place], [place] [address]    Culture   Final    NO GROWTH 2 DAYS Performed at [facility] [address], [place], [place] [address]    Report Status PENDING  Incomplete  Blood culture (routine x 2)     Status: None (Preliminary result)   Collection Time: 07/24/20  8:15 AM   Specimen: BLOOD  Result Value Ref Range Status   Specimen Description   Final    BLOOD LEFT WRIST Performed at [facility] [address], [place], [place] [address]    Special Requests   Final    BOTTLES DRAWN AEROBIC ONLY Blood Culture adequate volume Performed at [facility] [address] [place], [place] [address]    Culture   Final    NO GROWTH 2 DAYS Performed at [facility] [address] [place], [place] [address]    Report Status PENDING  Incomplete  SARS Coronavirus 2 by RT PCR (hospital order, performed in [facility] hospital lab) Nasopharyngeal Nasopharyngeal Swab     Status: None   Collection Time: 07/24/20  8:17 AM    Specimen: Nasopharyngeal Swab  Result Value Ref Range Status   SARS Coronavirus 2 NEGATIVE NEGATIVE Final    Comment: (NOTE) SARS-CoV-2 target nucleic acids are NOT DETECTED.  The SARS-CoV-2 RNA is generally detectable in upper and lower respiratory specimens during the acute phase of infection. The lowest concentration of SARS-CoV-2 viral copies this assay can detect is 250 copies / mL. A negative result does not preclude SARS-CoV-2 infection and should not be used as the sole basis for treatment or other patient management decisions.  A negative result may occur with improper specimen collection / handling, submission of specimen other than nasopharyngeal swab, presence of viral mutation(s) within the areas targeted by this assay, and inadequate number of viral copies (<250 copies / mL). A negative result must be combined with clinical observations, patient history, and epidemiological information.  Fact Sheet for Patients:   StrictlyIdeas.no  Fact Sheet for Healthcare Providers: BankingDealers.co.za  This test is not yet approved or  cleared by the [place] FDA and has been authorized for detection and/or diagnosis of SARS-CoV-2 by FDA under an Emergency Use Authorization (EUA).  This EUA will remain in effect (meaning this test can be used) for the duration of the COVID-19 declaration under Section 564(b)(1) of the Act, 21 U.S.C. section 360bbb-3(b)(1), unless the authorization is terminated or revoked sooner.  Performed at [facility], [place] [address], [place], [place] [address]   MRSA PCR Screening     Status: None   Collection Time: 07/24/20  5:19 PM   Specimen: Nasopharyngeal  Result Value Ref Range Status   MRSA by PCR NEGATIVE NEGATIVE Final    Comment:        The GeneXpert MRSA Assay (FDA approved for NASAL specimens only), is one component of a comprehensive MRSA colonization surveillance  program. It is not intended to diagnose MRSA infection nor to guide or monitor treatment for MRSA infections.  Performed at [facility], [place] [address], [place], [place] [address]     Radiology Reports CT ABDOMEN PELVIS WO CONTRAST  Addendum Date: 07/26/2020   ADDENDUM REPORT: 07/26/2020 12:31 ADDENDUM: Critical Value/emergent results were called by telephone at the time of interpretation on 07/26/2020 at 1226 hours to Dr. Nira Conn Gabryela Kimbrell , who verbally acknowledged these results. Electronically Signed   By: Genevie Ann M.D.   On: 07/26/2020 12:31   Result Date: 07/26/2020 CLINICAL DATA:  59 year old male with left upper quadrant abdominal pain. Metastatic hepatocellular carcinoma. On Eliquis for pulmonary embolus, status post abdominal/retroperitoneal hemorrhage. Hemoglobin continues to trend down, query worsening hematoma. EXAM: CT ABDOMEN AND PELVIS WITHOUT CONTRAST TECHNIQUE: Multidetector CT imaging of the abdomen and pelvis was performed following the standard protocol without IV contrast. COMPARISON:  CT Abdomen and Pelvis 07/24/2020 without contrast. CT Abdomen and Pelvis with contrast 07/18/2020. FINDINGS: Lower chest: Stable lung bases with bronchiectasis, fibrosis and scarring. No pericardial or pleural effusion. Hepatobiliary: Increased perihepatic complex fluid compatible with hemoperitoneum. Additional details are in the Other section below. Nodular liver contour related to infiltrative tumor in both the right and left hepatic lobes again noted. And there is more hyperdense hematoma located along the undersurface of the liver a especially at the left lobe and porta hepatis as seen on coronal image 30 today. Stable noncontrast gallbladder. Pancreas: Stable pancreatic atrophy. Spleen: Increased, moderate complex free fluid about the spleen compatible with hemoperitoneum. Layering hematocrit is evident on series 2, image 29. Stable small splenic size. Adrenals/Urinary Tract:  Negative noncontrast appearance. Stomach/Bowel: No free air. No dilated large or small bowel. Some bowel loops are difficult to delineate from the moderate volume of hemoperitoneum, further detailed below. The stomach is decompressed. Vascular/Lymphatic: Vascular patency is not evaluated in the absence of IV contrast. Aortoiliac calcified atherosclerosis. Stable dense calcification in the deep pelvis which might be chronically calcified lymph node. No abdominal or pelvic lymphadenopathy. Reproductive: Negative. Other: Complex free fluid in the pelvis in the and abdominal peritoneal cavity has increased since 07/24/2020. See series 2, image 53 today compared to series 2, image 45 previously, series 2, image 75 today compared to series 2, image 68 previously). The volume is now moderate. Musculoskeletal: Lytic lesions of the right T9 vertebral posterior elements, T12 vertebral body, right L4 vertebral body and pedicle (with stable pathologic fracture as well as extraosseous and epidural extension - see series 2, image 57)), left sacral ala. Stable visualized osseous structures. IMPRESSION: 1. Increased and now moderate volume Hemoperitoneum throughout the abdomen and pelvis. Given hyperdense clot along the undersurface of the liver, favor bleeding hepatic tumor as the source of hemorrhage. 2. Grossly stable infiltrative liver tumor and complicated spinal metastatic disease since 07/24/2020. 3. Lung base Bronchiectasis and scarring. Aortic Atherosclerosis (ICD10-I70.0). Electronically Signed: By: Genevie Ann M.D. On: 07/26/2020 12:24   CT ABDOMEN PELVIS WO CONTRAST  Result Date: 07/24/2020 CLINICAL DATA:  Acute generalized abdominal pain and distention. History of hepatocellular carcinoma. EXAM: CT ABDOMEN AND PELVIS WITHOUT CONTRAST TECHNIQUE: Multidetector CT imaging of the abdomen and pelvis was performed following the standard protocol without IV contrast. COMPARISON:  July 18, 2020. FINDINGS: Lower chest: Mild  bibasilar subsegmental atelectasis is noted. Hepatobiliary: No gallstones or biliary dilatation is noted. Multiple rounded hypoechoic lesions are noted throughout the liver consistent with metastatic disease. Pancreas: Unremarkable. No pancreatic ductal dilatation or surrounding inflammatory changes. Spleen: Normal in size without focal abnormality. Adrenals/Urinary Tract: Adrenal glands are unremarkable. Kidneys are normal, without  renal calculi, focal lesion, or hydronephrosis. Bladder is unremarkable. Stomach/Bowel: The stomach is unremarkable. There is no evidence of bowel obstruction or inflammation the appendix appears normal. Vascular/Lymphatic: Aortic atherosclerosis. No enlarged abdominal or pelvic lymph nodes. Reproductive: Prostate is unremarkable. Other: There is the interval development of high density fluid in the pelvis most consistent with hemorrhage. There also appears to be complex hyperdense abnormality seen anteriorly in the central portion of the abdomen, also consistent with intraperitoneal hematoma or hemorrhage. No hernia is noted. Hemorrhage is also noted inferior to the right hepatic lobe. Musculoskeletal: Large lytic destructive lesions are noted in the left sacrum, L1 and L4 vertebral bodies. The L4 lesion appears to extend into the right paraspinal soft tissues. IMPRESSION: 1. Interval development of high density fluid in the pelvis most consistent with hemorrhage. There also appears to be complex hyperdense abnormality seen anteriorly in the central portion of the abdomen, also consistent with intraperitoneal hematoma or hemorrhage. Hemorrhage is also noted inferior to the right hepatic lobe. Critical Value/emergent results were called by telephone at the time of interpretation on 07/24/2020 at 8:46 am to provider ADAM CURATOLO , who verbally acknowledged these results. 2. Multiple rounded hypoechoic lesions are noted throughout the liver consistent with metastatic disease. 3. Large  lytic destructive lesions are noted in the left sacrum, L1 and L4 vertebral bodies consistent with metastatic disease. The L4 lesion appears to extend into the right paraspinal soft tissues. 4. Aortic atherosclerosis. Aortic Atherosclerosis (ICD10-I70.0). Electronically Signed   By: Marijo Conception M.D.   On: 07/24/2020 08:46   DG Chest 2 View  Result Date: 07/19/2020 CLINICAL DATA:  59 year old male with shortness of breath. EXAM: CHEST - 2 VIEW COMPARISON:  Chest radiograph dated 07/19/2020. FINDINGS: No focal consolidation, pleural effusion, or pneumothorax. The cardiac silhouette is within limits. Atherosclerotic calcification of the aorta. No acute osseous pathology. Degenerative changes of the spine and bilateral AC joints. IMPRESSION: No active cardiopulmonary disease. Electronically Signed   By: Anner Crete M.D.   On: 07/19/2020 21:59   DG Chest 2 View  Result Date: 07/19/2020 CLINICAL DATA:  Shortness of breath EXAM: CHEST - 2 VIEW COMPARISON:  Chest CT 04/24/2020 FINDINGS: Normal heart size and mediastinal contours. No acute infiltrate or edema. No effusion or pneumothorax. Known osseous metastatic disease by CT. No acute osseous findings. IMPRESSION: No acute finding. Electronically Signed   By: Monte Fantasia M.D.   On: 07/19/2020 07:25   CT Angio Chest PE W and/or Wo Contrast  Result Date: 07/19/2020 CLINICAL DATA:  Shortness of breath. Possible PE seen on CT 1 day ago. Stage IV multifocal hepatocellular carcinoma. EXAM: CT ANGIOGRAPHY CHEST WITH CONTRAST TECHNIQUE: Multidetector CT imaging of the chest was performed using the standard protocol during bolus administration of intravenous contrast. Multiplanar CT image reconstructions and MIPs were obtained to evaluate the vascular anatomy. CONTRAST:  35mL OMNIPAQUE IOHEXOL 350 MG/ML SOLN COMPARISON:  CT abdomen-pelvis 07/18/2020.  CT chest 04/24/2020 FINDINGS: Cardiovascular: Satisfactory opacification of the pulmonary arteries to the  segmental level. Chronic nonocclusive filling defect within the lobar and segmental branches of the right lower lobe pulmonary artery (series 4, images 50-52). The thrombus burden has decreased compared to prior CT 04/24/2020. No additional filling defects are identified. No central PE. Normal RV to LV ratio without evidence of right heart strain. Thoracic aorta is nonaneurysmal. Atherosclerotic calcification of the aortic arch. Normal heart size. No pericardial effusion. Mediastinum/Nodes: No enlarged mediastinal, hilar, or axillary lymph nodes. Thyroid gland, trachea, and  esophagus demonstrate no significant findings. Lungs/Pleura: Similar mild-moderate emphysematous lung changes. No focal airspace consolidation. No pulmonary nodules or masses. No pleural effusion or pneumothorax. Upper Abdomen: Numerous confluent heterogeneous enhancing liver masses, as detailed on CT abdomen pelvis 07/18/2020. No new or acute findings within the visualized portion of the upper abdomen. Musculoskeletal: Lytic metastatic lesion within the left portion of the T12 vertebral body. Expansile lytic lesion centered within the right T9 transverse process (series 4, image 61). No definite new areas of osseous metastasis are identified. No acute fracture. Review of the MIP images confirms the above findings. IMPRESSION: 1. Chronic nonocclusive filling defect within the lobar and segmental branches of the right lower lobe pulmonary artery. The thrombus burden has decreased compared to prior CT 04/24/2020. No additional filling defects are identified. No evidence of right heart strain. 2. Lungs are clear. 3. Osseous metastatic disease as described above. 4. Numerous confluent heterogeneous enhancing liver masses, as detailed on CT abdomen pelvis 07/18/2020. Aortic Atherosclerosis (ICD10-I70.0) and Emphysema (ICD10-J43.9). Electronically Signed   By: Duanne Guess D.O.   On: 07/19/2020 09:06   NM Bone Scan Whole Body  Result Date:  07/18/2020 CLINICAL DATA:  Hepatocellular carcinoma.  Known bony metastasis EXAM: NUCLEAR MEDICINE WHOLE BODY BONE SCAN TECHNIQUE: Whole body anterior and posterior images were obtained approximately 3 hours after intravenous injection of radiopharmaceutical. RADIOPHARMACEUTICALS:  19.6 mCi Technetium-28m MDP IV COMPARISON:  Bone scan 05/20/2020 FINDINGS: Physiologic distribution of radiotracer with activity in the bilateral kidneys and urinary bladder. Faint uptake is seen within the right T9 pedicle. Faint uptake is also seen within the T12 vertebral body. No appreciable radiotracer accumulation within the L4 vertebrae or left hemi sacrum at sites of known metastatic disease. Similar distribution of degenerative uptake including within the bilateral shoulders and knees. IMPRESSION: 1. Faint uptake within the T9 and T12 vertebrae at sites of known metastatic disease. 2. No appreciable focal uptake is seen within the L4 vertebrae or left hemi sacrum at sites of known osseous metastatic disease. These lesions are more evident on CT. Electronically Signed   By: Duanne Guess D.O.   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 History of palliative radiation at T9, T10 and L4. Mild going medical therapy. Restaging. EXAM: CT ABDOMEN AND PELVIS WITH CONTRAST TECHNIQUE: Multidetector CT imaging of the abdomen and pelvis was performed using the standard protocol following bolus administration of intravenous contrast. CONTRAST:  OMNIPAQUE IOHEXOL 300 MG/ML  SOLN COMPARISON:  04/24/2020 CT chest, abdomen and pelvis. FINDINGS: Lower chest: Centrilobular emphysema at the lung bases. Low signal filling defect centrally in the right lower lobe pulmonary artery branch on the uppermost slice (series 2/image 1), potentially acute pulmonary embolus. Hepatobiliary: Numerous bulky confluent heterogeneous  hyperenhancing liver masses throughout the liver, increased since 04/24/2020 CT. Representative liver masses as follows: -segment 3 left liver lobe 4.6 x 4.4 cm mass (series 2/image 51), previously 4.3 x 3.7 cm -segment 2 left liver lobe 2.7 x 2.2 cm mass (series 2/image 31), previously 2.0 x 1.5 cm -segment 5 right liver 3.6 x 3.2 cm mass (series 2/image 62), previously 3.0 x 2.5 cm Normal gallbladder with no radiopaque cholelithiasis. No biliary ductal dilatation. Pancreas: Normal, with no mass or duct dilation. Spleen: Normal size. No mass. Adrenals/Urinary Tract: Normal adrenals. Normal kidneys with no hydronephrosis and no renal mass. Normal bladder. Stomach/Bowel: Normal non-distended stomach. Normal caliber small bowel  with no small bowel wall thickening. Normal appendix. Normal large bowel with no diverticulosis, large bowel wall thickening or pericolonic fat stranding. Vascular/Lymphatic: Atherosclerotic nonaneurysmal abdominal aorta. Patent portal, splenic, hepatic and renal veins. No pathologically enlarged lymph nodes in the abdomen or pelvis. Reproductive: Normal size prostate. Other: No pneumoperitoneum, ascites or focal fluid collection. Musculoskeletal: Multiple lytic bone metastases scattered in the visualized spine and sacrum, increased. Representative expansile right L4 vertebral 6.4 x 5.7 cm lesion (series 12/image 48), increased from 6.0 x 4.9 cm. Representative left upper sacral 3.4 x 3.0 cm lesion (series 12/image 63), not previously imaged. Represent of left T12 2.9 x 2.8 cm lesion (series 12/image 23), increased from 2.4 x 2.3 cm. Marked lumbar spondylosis. IMPRESSION: 1. Probable acute pulmonary embolus partially visualized at the right lung base. Further evaluation with PE protocol CT chest angiogram may be obtained as clinically warranted. 2. Widespread liver metastases, increased. 3. Multiple lytic bone metastases, increased. 4. Aortic Atherosclerosis (ICD10-I70.0) and Emphysema  (ICD10-J43.9). Critical Value/emergent results were called by telephone at the time of interpretation on 07/18/2020 at 12:54 pm to provider Truitt Merle , who verbally acknowledged these results. Electronically Signed   By: Ilona Sorrel M.D.   On: 07/18/2020 13:18   DG Chest Port 1 View  Result Date: 07/25/2020 CLINICAL DATA:  Abdominal hemorrhage EXAM: PORTABLE CHEST 1 VIEW COMPARISON:  Abdominal CT from yesterday. FINDINGS: Streaky density at the bases attributed atelectasis superimposed on chronic interstitial coarsening. There is no edema, consolidation, effusion, or pneumothorax. Normal heart size and mediastinal contours. IMPRESSION: Atelectasis at the bases as seen on preceding abdominal CT. Electronically Signed   By: Monte Fantasia M.D.   On: 07/25/2020 05:58   VAS [place] LOWER EXTREMITY VENOUS (DVT)  Result Date: 07/25/2020  Lower Venous DVTStudy Indications: Pulmonary embolism.  Risk Factors: Confirmed PE. Comparison Study: No prior studies. Performing Technologist: Oliver Hum RVT  Examination Guidelines: A complete evaluation includes B-mode imaging, spectral Doppler, color Doppler, and power Doppler as needed of all accessible portions of each vessel. Bilateral testing is considered an integral part of a complete examination. Limited examinations for reoccurring indications may be performed as noted. The reflux portion of the exam is performed with the patient in reverse Trendelenburg.  +---------+---------------+---------+-----------+----------+--------------+ RIGHT    CompressibilityPhasicitySpontaneityPropertiesThrombus Aging +---------+---------------+---------+-----------+----------+--------------+ CFV      Full           Yes      Yes                                 +---------+---------------+---------+-----------+----------+--------------+ SFJ      Full                                                         +---------+---------------+---------+-----------+----------+--------------+ FV Prox  Full                                                        +---------+---------------+---------+-----------+----------+--------------+ FV Mid   Full                                                        +---------+---------------+---------+-----------+----------+--------------+  FV DistalFull                                                        +---------+---------------+---------+-----------+----------+--------------+ PFV      Full                                                        +---------+---------------+---------+-----------+----------+--------------+ POP      Full           Yes      Yes                                 +---------+---------------+---------+-----------+----------+--------------+ PTV      Full                                                        +---------+---------------+---------+-----------+----------+--------------+ PERO     Full                                                        +---------+---------------+---------+-----------+----------+--------------+   +---------+---------------+---------+-----------+----------+--------------+ LEFT     CompressibilityPhasicitySpontaneityPropertiesThrombus Aging +---------+---------------+---------+-----------+----------+--------------+ CFV      Full           Yes      Yes                                 +---------+---------------+---------+-----------+----------+--------------+ SFJ      Full                                                        +---------+---------------+---------+-----------+----------+--------------+ FV Prox  Full                                                        +---------+---------------+---------+-----------+----------+--------------+ FV Mid   Full                                                         +---------+---------------+---------+-----------+----------+--------------+ FV DistalFull                                                        +---------+---------------+---------+-----------+----------+--------------+  PFV      Full                                                        +---------+---------------+---------+-----------+----------+--------------+ POP      Full           Yes      Yes                                 +---------+---------------+---------+-----------+----------+--------------+ PTV      Full                                                        +---------+---------------+---------+-----------+----------+--------------+ PERO     Full                                                        +---------+---------------+---------+-----------+----------+--------------+     Summary: RIGHT: - There is no evidence of deep vein thrombosis in the lower extremity.  - No cystic structure found in the popliteal fossa.  LEFT: - There is no evidence of deep vein thrombosis in the lower extremity.  - No cystic structure found in the popliteal fossa.  *See table(s) above for measurements and observations. Electronically signed by Monica Martinez MD on 07/25/2020 at 3:24:16 PM.    Final     Lab Data:  CBC: Recent Labs  Lab 07/24/20 0709 07/24/20 0854 07/24/20 1430 07/24/20 1430 07/24/20 2100 07/24/20 2100 07/25/20 0207 07/25/20 1355 07/25/20 1926 07/26/20 0257 07/26/20 1107  WBC 9.5   < > 6.2  --  7.2  --  6.9 8.0  --  7.7  --   NEUTROABS 6.9  --   --   --   --   --   --   --   --   --   --   HGB 8.2*   < > 8.0*   < > 8.8*   < > 9.0* 7.7* 7.7* 6.5* 8.0*  HCT 26.9*   < > 24.8*   < > 27.6*   < > 28.8* 24.4* 24.1* 20.5* 24.5*  MCV 96.8   < > 91.9  --  91.7  --  93.2 93.1  --  94.5  --   PLT 260   < > 168  --  179  --  175 207  --  210  --    < > = values in this interval not displayed.   Basic Metabolic Panel: Recent Labs  Lab 07/24/20 0709  07/25/20 0207 07/26/20 0257  NA 140 137 138  K 4.5 4.2 4.9  CL 103 104 105  CO2 21* 21* 21*  GLUCOSE 178* 82 108*  BUN 24* 24* 32*  CREATININE 3.03* 1.75* 2.21*  CALCIUM 9.9 9.0 8.8*  MG  --  1.5* 2.6*  PHOS  --  3.4  --    GFR: Estimated Creatinine Clearance: 32.8 mL/min (A) (by C-G formula  based on SCr of 2.21 mg/dL (H)). Liver Function Tests: Recent Labs  Lab 07/24/20 0709 07/26/20 0257  AST 204* 252*  ALT 79* 96*  ALKPHOS 282* 230*  BILITOT 2.1* 2.7*  PROT 8.0 6.6  ALBUMIN 3.3* 2.7*   Recent Labs  Lab 07/24/20 0709  LIPASE 39   No results for input(s): AMMONIA in the last 168 hours. Coagulation Profile: Recent Labs  Lab 07/24/20 0810  INR 1.5*   Cardiac Enzymes: No results for input(s): CKTOTAL, CKMB, CKMBINDEX, TROPONINI in the last 168 hours. BNP (last 3 results) No results for input(s): PROBNP in the last 8760 hours. HbA1C: No results for input(s): HGBA1C in the last 72 hours. CBG: Recent Labs  Lab 07/25/20 1726  GLUCAP 108*   Lipid Profile: No results for input(s): CHOL, HDL, LDLCALC, TRIG, CHOLHDL, LDLDIRECT in the last 72 hours. Thyroid Function Tests: No results for input(s): TSH, T4TOTAL, FREET4, T3FREE, THYROIDAB in the last 72 hours. Anemia Panel: No results for input(s): VITAMINB12, FOLATE, FERRITIN, TIBC, IRON, RETICCTPCT in the last 72 hours. Urine analysis:    Component Value Date/Time   COLORURINE YELLOW (A) 05/25/2018 1119   APPEARANCEUR CLEAR (A) 05/25/2018 1119   LABSPEC 1.016 05/25/2018 1119   PHURINE 5.0 05/25/2018 1119   GLUCOSEU NEGATIVE 05/25/2018 1119   HGBUR NEGATIVE 05/25/2018 1119   BILIRUBINUR NEGATIVE 05/25/2018 1119   KETONESUR NEGATIVE 05/25/2018 1119   PROTEINUR NEGATIVE 05/25/2018 1119   NITRITE NEGATIVE 05/25/2018 1119   LEUKOCYTESUR NEGATIVE 05/25/2018 1119     Jamiere Gulas M.D. [facility] 07/26/2020, 1:55 PM   Call night coverage person covering after 7pm

## 2020-07-26 NOTE — Sedation Documentation (Signed)
A.  Jenne Campus, RN assumed care of patient.  Report received at bedside.  Patient hemodynamic at this time

## 2020-07-26 NOTE — Progress Notes (Addendum)
Referring Physician(s): Rai,R/Feng,Y  Supervising Physician: Corrie Mckusick  Patient Status:  [facility] - In-pt  Chief Complaint: Abdominal pain/abdominal bleed/pulmonary embolus   Subjective: Patient sitting up in chair, states abdominal pain has diminished since admission; reports no fever, headache, dyspnea, back pain, nausea, vomiting or visible bleeding.  Lower extremity venous Dopplers negative yesterday; hgb 6.5(7.7), creat 2.21; request received for IVC filter placement.   Allergies: Ms contin [morphine sulfate er]  Medications: Prior to Admission medications   Medication Sig Start Date End Date Taking? Authorizing Provider  albuterol (VENTOLIN HFA) 108 (90 Base) MCG/ACT inhaler Take 90 mcg by mouth every 4 (four) hours as needed.   Yes [provider]  apixaban (ELIQUIS) 5 MG TABS tablet Take 2 tablets (10mg ) twice daily for 7 days, then 1 tablet (5mg ) twice daily 07/19/20  Yes Truitt Merle, MD  gabapentin (NEURONTIN) 300 MG capsule Take 1 capsule (300 mg total) by mouth 3 (three) times daily. 05/31/20  Yes Truitt Merle, MD  ondansetron (ZOFRAN) 8 MG tablet Take 1 tablet (8 mg total) by mouth every 8 (eight) hours as needed for nausea or vomiting. 06/26/20  Yes Truitt Merle, MD  oxyCODONE ER [facility] ER) 13.5 MG C12A Take 13.5 mg by mouth every 12 (twelve) hours. 07/11/20  Yes Alla Feeling, NP  Oxycodone HCl 10 MG TABS Take 1 tablet (10 mg total) by mouth every 6 (six) hours as needed. OK to refill on 05/18/2020 07/22/20  Yes Truitt Merle, MD  polyethylene glycol (MIRALAX / GLYCOLAX) 17 g packet Take 17 g by mouth daily as needed for mild constipation. 04/26/20  Yes Amin, Jeanella Flattery, MD  prochlorperazine (COMPAZINE) 10 MG tablet Take 1 tablet (10 mg total) by mouth every 6 (six) hours as needed for nausea or vomiting. 06/21/20  Yes Truitt Merle, MD  dronabinol (MARINOL) 2.5 MG capsule Take 1 capsule (2.5 mg total) by mouth 2 (two) times daily before a meal. Patient not taking: Reported on  07/24/2020 06/21/20   Truitt Merle, MD  Lenvatinib 12 mg daily dose (LENVIMA) 3 x 4 MG capsule Take 12 mg by mouth daily. Start at 4mg  daily for first week, then increase to 8mg  daily for second week and 12mg  daily for third week if tolerates well Patient not taking: Reported on 06/21/2020 05/27/20   Truitt Merle, MD     Vital Signs: BP (!) 165/80 (BP Location: Left Arm)   Pulse (!) 102   Temp 97.8 F (36.6 C) (Oral)   Resp (!) 23   Ht 5\' 7"  (1.702 m)   Wt 154 lb 1.6 oz (69.9 kg)   SpO2 97%   BMI 24.14 kg/m   Physical Exam awake, alert.  Chest clear to auscultation bilaterally.  Heart with slightly tachycardic but regular rhythm.  Abdomen soft, slightly distended, mild generalized tenderness to palpation.  No lower extremity edema.   Imaging: CT ABDOMEN PELVIS WO CONTRAST  Result Date: 07/24/2020 CLINICAL DATA:  Acute generalized abdominal pain and distention. History of hepatocellular carcinoma. EXAM: CT ABDOMEN AND PELVIS WITHOUT CONTRAST TECHNIQUE: Multidetector CT imaging of the abdomen and pelvis was performed following the standard protocol without IV contrast. COMPARISON:  July 18, 2020. FINDINGS: Lower chest: Mild bibasilar subsegmental atelectasis is noted. Hepatobiliary: No gallstones or biliary dilatation is noted. Multiple rounded hypoechoic lesions are noted throughout the liver consistent with metastatic disease. Pancreas: Unremarkable. No pancreatic ductal dilatation or surrounding inflammatory changes. Spleen: Normal in size without focal abnormality. Adrenals/Urinary Tract: Adrenal glands are unremarkable.  Kidneys are normal, without renal calculi, focal lesion, or hydronephrosis. Bladder is unremarkable. Stomach/Bowel: The stomach is unremarkable. There is no evidence of bowel obstruction or inflammation the appendix appears normal. Vascular/Lymphatic: Aortic atherosclerosis. No enlarged abdominal or pelvic lymph nodes. Reproductive: Prostate is unremarkable. Other: There is the  interval development of high density fluid in the pelvis most consistent with hemorrhage. There also appears to be complex hyperdense abnormality seen anteriorly in the central portion of the abdomen, also consistent with intraperitoneal hematoma or hemorrhage. No hernia is noted. Hemorrhage is also noted inferior to the right hepatic lobe. Musculoskeletal: Large lytic destructive lesions are noted in the left sacrum, L1 and L4 vertebral bodies. The L4 lesion appears to extend into the right paraspinal soft tissues. IMPRESSION: 1. Interval development of high density fluid in the pelvis most consistent with hemorrhage. There also appears to be complex hyperdense abnormality seen anteriorly in the central portion of the abdomen, also consistent with intraperitoneal hematoma or hemorrhage. Hemorrhage is also noted inferior to the right hepatic lobe. Critical Value/emergent results were called by telephone at the time of interpretation on 07/24/2020 at 8:46 am to provider ADAM CURATOLO , who verbally acknowledged these results. 2. Multiple rounded hypoechoic lesions are noted throughout the liver consistent with metastatic disease. 3. Large lytic destructive lesions are noted in the left sacrum, L1 and L4 vertebral bodies consistent with metastatic disease. The L4 lesion appears to extend into the right paraspinal soft tissues. 4. Aortic atherosclerosis. Aortic Atherosclerosis (ICD10-I70.0). Electronically Signed   By: Marijo Conception M.D.   On: 07/24/2020 08:46   DG Chest Port 1 View  Result Date: 07/25/2020 CLINICAL DATA:  Abdominal hemorrhage EXAM: PORTABLE CHEST 1 VIEW COMPARISON:  Abdominal CT from yesterday. FINDINGS: Streaky density at the bases attributed atelectasis superimposed on chronic interstitial coarsening. There is no edema, consolidation, effusion, or pneumothorax. Normal heart size and mediastinal contours. IMPRESSION: Atelectasis at the bases as seen on preceding abdominal CT. Electronically  Signed   By: Monte Fantasia M.D.   On: 07/25/2020 05:58   VAS [place] LOWER EXTREMITY VENOUS (DVT)  Result Date: 07/25/2020  Lower Venous DVTStudy Indications: Pulmonary embolism.  Risk Factors: Confirmed PE. Comparison Study: No prior studies. Performing Technologist: Oliver Hum RVT  Examination Guidelines: A complete evaluation includes B-mode imaging, spectral Doppler, color Doppler, and power Doppler as needed of all accessible portions of each vessel. Bilateral testing is considered an integral part of a complete examination. Limited examinations for reoccurring indications may be performed as noted. The reflux portion of the exam is performed with the patient in reverse Trendelenburg.  +---------+---------------+---------+-----------+----------+--------------+ RIGHT    CompressibilityPhasicitySpontaneityPropertiesThrombus Aging +---------+---------------+---------+-----------+----------+--------------+ CFV      Full           Yes      Yes                                 +---------+---------------+---------+-----------+----------+--------------+ SFJ      Full                                                        +---------+---------------+---------+-----------+----------+--------------+ FV Prox  Full                                                        +---------+---------------+---------+-----------+----------+--------------+  FV Mid   Full                                                        +---------+---------------+---------+-----------+----------+--------------+ FV DistalFull                                                        +---------+---------------+---------+-----------+----------+--------------+ PFV      Full                                                        +---------+---------------+---------+-----------+----------+--------------+ POP      Full           Yes      Yes                                  +---------+---------------+---------+-----------+----------+--------------+ PTV      Full                                                        +---------+---------------+---------+-----------+----------+--------------+ PERO     Full                                                        +---------+---------------+---------+-----------+----------+--------------+   +---------+---------------+---------+-----------+----------+--------------+ LEFT     CompressibilityPhasicitySpontaneityPropertiesThrombus Aging +---------+---------------+---------+-----------+----------+--------------+ CFV      Full           Yes      Yes                                 +---------+---------------+---------+-----------+----------+--------------+ SFJ      Full                                                        +---------+---------------+---------+-----------+----------+--------------+ FV Prox  Full                                                        +---------+---------------+---------+-----------+----------+--------------+ FV Mid   Full                                                        +---------+---------------+---------+-----------+----------+--------------+  FV DistalFull                                                        +---------+---------------+---------+-----------+----------+--------------+ PFV      Full                                                        +---------+---------------+---------+-----------+----------+--------------+ POP      Full           Yes      Yes                                 +---------+---------------+---------+-----------+----------+--------------+ PTV      Full                                                        +---------+---------------+---------+-----------+----------+--------------+ PERO     Full                                                         +---------+---------------+---------+-----------+----------+--------------+     Summary: RIGHT: - There is no evidence of deep vein thrombosis in the lower extremity.  - No cystic structure found in the popliteal fossa.  LEFT: - There is no evidence of deep vein thrombosis in the lower extremity.  - No cystic structure found in the popliteal fossa.  *See table(s) above for measurements and observations. Electronically signed by Monica Martinez MD on 07/25/2020 at 3:24:16 PM.    Final     Labs:  CBC: Recent Labs    07/24/20 2100 07/24/20 2100 07/25/20 0207 07/25/20 1355 07/25/20 1926 07/26/20 0257  WBC 7.2  --  6.9 8.0  --  7.7  HGB 8.8*   < > 9.0* 7.7* 7.7* 6.5*  HCT 27.6*   < > 28.8* 24.4* 24.1* 20.5*  PLT 179  --  175 207  --  210   < > = values in this interval not displayed.    COAGS: Recent Labs    04/24/20 2216 07/24/20 0810 07/24/20 1000  INR 1.0 1.5*  --   APTT  --   --  37*    BMP: Recent Labs    07/19/20 0651 07/24/20 0709 07/25/20 0207 07/26/20 0257  NA 137 140 137 138  K 4.0 4.5 4.2 4.9  CL 104 103 104 105  CO2 22 21* 21* 21*  GLUCOSE 99 178* 82 108*  BUN 14 24* 24* 32*  CALCIUM 9.4 9.9 9.0 8.8*  CREATININE 1.21 3.03* 1.75* 2.21*  GFRNONAA >60 21* 41* 31*  GFRAA >60 25* 48* 36*    LIVER FUNCTION TESTS: Recent Labs    06/21/20 0843 07/19/20 0651 07/24/20 0709 07/26/20 0257  BILITOT 0.9 1.8* 2.1* 2.7*  AST 168* 183* 204*  252*  ALT 117* 71* 79* 96*  ALKPHOS 221* 285* 282* 230*  PROT 8.2* 8.0 8.0 6.6  ALBUMIN 2.9* 3.2* 3.3* 2.7*    Assessment and Plan: 58 yo male with history of hepatitis C and stage IV multifocal HCC along with previously diagnosed right lower lobe PE in May of this year, currently on Eliquis.  CT angio chest done 07/19/20 revealed chronic nonocclusive filling defect within the lobar and segmental branches of the right lower lobe pulmonary artery. He has had a history of worsening abdominal pain over the past several days and  presented to the ED recently for evaluation.  CT of the abdomen pelvis performed 8/25 revealed:   1. Interval development of high density fluid in the pelvis most consistent with hemorrhage. There also appears to be complex hyperdense abnormality seen anteriorly in the central portion of the abdomen, also consistent with intraperitoneal hematoma or hemorrhage. Hemorrhage is also noted inferior to the right hepatic lobe.  2. Multiple rounded hypoechoic lesions are noted throughout the liver consistent with metastatic disease. 3. Large lytic destructive lesions are noted in the left sacrum, L1 and L4 vertebral bodies consistent with metastatic disease. The L4 lesion appears to extend into the right paraspinal soft tissues. 4. Aortic atherosclerosis.  He previously received FFP, packed red blood cells and Kcentra to reverse Eliquis.  He is COVID-19 negative.  Recent bilateral lower extremity venous Dopplers negative for DVT; AF; BP 165/80;  labs today include WBC normal, hemoglobin 6.5 down from 7.7, platelets 210k, creatinine 2.21 up from 1.75; patient reports no visible bleeding despite hemoglobin drop.  Request received now for IVC filter placement.  Case has been discussed thoroughly with Drs Berdie Ogren by Dr. Earleen Newport; plan at this time is to proceed with IVC filter placement today.  Details/risks of procedure, including but not limited to, internal bleeding, infection, injury to adjacent structures, IVC thrombosis, possible permanent nature of filter d/w pt with patient with his understanding and consent.  Due to renal insufficiency we will also plan to proceed with unenhanced CT abdomen pelvis today to assess any change in abdominal hemorrhage/ hematoma.  Ideally should hepatic or visceral embolization become necessary CT angio abdomen pelvis would be next step to assess location of bleed prior to any endovascular tx. Recommend that nephrology be on board prior to contrast administration to patient  due to moderate risk of worsening nephropathy.  Patient.nurse aware of plans.   Electronically Signed: D. Rowe Robert, PA-C 07/26/2020, 11:02 AM   I spent a total of 30 minutes at the the patient's bedside AND on the patient's hospital floor or unit, greater than 50% of which was counseling/coordinating care for inferior vena cava filter placement    Patient ID: Vernon [name], male   DOB: November 04, 1961, 59 y.o.   MRN: [id number]

## 2020-07-27 DIAGNOSIS — Z515 Encounter for palliative care: Secondary | ICD-10-CM

## 2020-07-27 DIAGNOSIS — G893 Neoplasm related pain (acute) (chronic): Secondary | ICD-10-CM

## 2020-07-27 DIAGNOSIS — Z7189 Other specified counseling: Secondary | ICD-10-CM

## 2020-07-27 LAB — HEPATIC FUNCTION PANEL
ALT: 143 U/L — ABNORMAL HIGH (ref 0–44)
AST: 343 U/L — ABNORMAL HIGH (ref 15–41)
Albumin: 2.8 g/dL — ABNORMAL LOW (ref 3.5–5.0)
Alkaline Phosphatase: 244 U/L — ABNORMAL HIGH (ref 38–126)
Bilirubin, Direct: 1.1 mg/dL — ABNORMAL HIGH (ref 0.0–0.2)
Indirect Bilirubin: 1 mg/dL — ABNORMAL HIGH (ref 0.3–0.9)
Total Bilirubin: 2.1 mg/dL — ABNORMAL HIGH (ref 0.3–1.2)
Total Protein: 6.6 g/dL (ref 6.5–8.1)

## 2020-07-27 LAB — CBC
HCT: 20.7 % — ABNORMAL LOW (ref 39.0–52.0)
Hemoglobin: 6.8 g/dL — CL (ref 13.0–17.0)
MCH: 29.6 pg (ref 26.0–34.0)
MCHC: 32.9 g/dL (ref 30.0–36.0)
MCV: 90 fL (ref 80.0–100.0)
Platelets: 209 10*3/uL (ref 150–400)
RBC: 2.3 MIL/uL — ABNORMAL LOW (ref 4.22–5.81)
RDW: 19.9 % — ABNORMAL HIGH (ref 11.5–15.5)
WBC: 7.2 10*3/uL (ref 4.0–10.5)
nRBC: 0 % (ref 0.0–0.2)

## 2020-07-27 LAB — HEMOGLOBIN AND HEMATOCRIT, BLOOD
HCT: 21.5 % — ABNORMAL LOW (ref 39.0–52.0)
Hemoglobin: 6.8 g/dL — CL (ref 13.0–17.0)

## 2020-07-27 LAB — BASIC METABOLIC PANEL
Anion gap: 10 (ref 5–15)
BUN: 32 mg/dL — ABNORMAL HIGH (ref 8–23)
CO2: 20 mmol/L — ABNORMAL LOW (ref 22–32)
Calcium: 8.5 mg/dL — ABNORMAL LOW (ref 8.9–10.3)
Chloride: 107 mmol/L (ref 98–111)
Creatinine, Ser: 1.75 mg/dL — ABNORMAL HIGH (ref 0.61–1.24)
GFR calc Af Amer: 48 mL/min — ABNORMAL LOW (ref >60)
GFR calc non Af Amer: 41 mL/min — ABNORMAL LOW (ref >60)
Glucose, Bld: 100 mg/dL — ABNORMAL HIGH (ref 70–99)
Potassium: 4.3 mmol/L (ref 3.5–5.1)
Sodium: 137 mmol/L (ref 135–145)

## 2020-07-27 LAB — PREPARE RBC (CROSSMATCH)

## 2020-07-27 MED ORDER — SODIUM CHLORIDE 0.9% IV SOLUTION: Freq: Once | INTRAVENOUS | Status: AC

## 2020-07-27 MED ORDER — SODIUM CHLORIDE 0.9% IV SOLUTION: Freq: Once | INTRAVENOUS | Status: DC

## 2020-07-27 NOTE — Consult Note (Signed)
Consultation Note Date: 07/28/2020   Patient Name: Vernon [name]  DOB: 05/16/1961  MRN: [id number]  Age / Sex: 59 y.o., male  PCP: Lorelee Market, MD Referring Physician: Mendel Corning, MD  Reason for Consultation: Establishing goals of care  HPI/Patient Profile: 59 y.o. 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  He was found to have acute blood loss anemia with bleeding of hepatic tumor.  He underwent embolization of left hepatic arteries on 8/27.  Prior to this, plan was for transition to immunotherapy and bevacizumab due to recent cancer progression.  He is no longer a candidate to receive bevacizumab due to the fact that he had PE and bleeding.  Consideration still for immunotherapy with Avastin, however, he is currently critically ill.  Palliative consulted for goals of care.  Clinical Assessment and Goals of Care: I met today with Vernon [name].  He was awake and alert and pleasant in conversation.  I introduced palliative care as specialized medical care for people living with serious illness. It focuses on providing relief from the symptoms and stress of a serious illness. The goal is to improve quality of life for both the patient and the family.  Vernon [name] was just getting situated after transition to the floor from ICU.  He was still having initial evaluation to the floor completed by nurses at the time of my encounter.  We did discuss briefly regarding his situation and the fact that he relies on Dr. Burr Medico to help him make medical decisions.  He reports wanting to know her opinion now that he has had liver embolization completed.  The primary focus of our conversation today, however, was regarding his pain.  He reports that he is currently doing better from  pain management standpoint and does not feel he needs any changes to regimen at this time.  We agreed for me to come back to follow-up again tomorrow to continue conversation after he has had time to get situated to new room.  SUMMARY OF RECOMMENDATIONS   -Pain: Reports better controlled after having procedure completed.  He is currently ordered OxyContin 20 mg twice daily and has rescue oxycodone available as needed.  He has only used 1 dose of short acting medication the last 24 hours.  Recommend continue same for now. -Goals of care: Vernon [name] was still getting situated after transfer to the floor from stepdown unit at the time of my encounter.  We therefore had limited conversation today.  He does rely on Dr. Burr Medico to help with decision-making regarding his care and would like her further input on his situation now that he is s/p embolization procedure.  We will plan to follow-up tomorrow, however, he wants to speak with Dr. Burr Medico on Monday prior to making any decisions regarding long term GOC.  Code Status/Advance Care Planning:  Full code  Prognosis:   Unable to determine  Discharge Planning: To Be Determined      Primary Diagnoses: Present on Admission: . Abdominal pain   I have reviewed the medical record, interviewed the patient and family, and examined the patient. The following aspects are pertinent.  Past Medical History:  Diagnosis Date  . Anemia   . Gout   . Hepatitis C   . Hepatocellular carcinoma ([place])   . Pulmonary embolism ([facility])    Recurrent, on Eliquis    Social History   Socioeconomic History  . Marital status: Single    Spouse name: Not on file  . Number of children: 3  . Years of education: 10  . Highest education level: 10th grade  Occupational History  . Occupation: Chief Operating Officer    Comment: unemployed (05/2018)  Tobacco Use  . Smoking status: Former Smoker    Years: 18.00    Types: Cigarettes    Quit date: 08/25/2018    Years since  quitting: 1.9  . Smokeless tobacco: Never Used  . Tobacco comment: smokes about 3 cigarretes a month  Vaping Use  . Vaping Use: Never used  Substance and Sexual Activity  . Alcohol use: Yes    Comment: occasional beer  . Drug use: Never  . Sexual activity: Not Currently  Other Topics Concern  . Not on file  Social History Narrative  . Not on file   Social Determinants of Health   Financial Resource Strain:   . Difficulty of Paying Living Expenses: Not on file  Food Insecurity:   . Worried About Charity fundraiser in the Last Year: Not on file  . Ran Out of Food in the Last Year: Not on file  Transportation Needs:   . Lack of Transportation (Medical): Not on file  . Lack of Transportation (Non-Medical): Not on file  Physical Activity:   . Days of Exercise per Week: Not on file  . Minutes of Exercise per Session: Not on file  Stress:   . Feeling of Stress : Not on file  Social Connections:   . Frequency of Communication with Friends and Family: Not on file  . Frequency of Social Gatherings with Friends and Family: Not on file  . Attends Religious Services: Not on file  . Active Member of Clubs or Organizations: Not on file  . Attends Archivist Meetings: Not on file  . Marital Status: Not on file   Family History  Problem Relation Age of Onset  . Cancer Mother   . Cancer Father   . Hypertension Maternal Uncle   . Healthy Daughter   . Healthy Son    Scheduled Meds: . sodium chloride   Intravenous Once  . Chlorhexidine Gluconate Cloth  6 each Topical Daily  . mouth rinse  15 mL Mouth Rinse BID  . oxyCODONE  20 mg Oral Q12H   Continuous Infusions: . sodium chloride 100 mL/hr at 07/27/20 1403   PRN Meds:.docusate sodium, hydrALAZINE, ondansetron (ZOFRAN) IV, oxyCODONE, polyethylene glycol Medications Prior to Admission:  Prior to Admission medications   Medication Sig Start Date End Date Taking? Authorizing Provider  albuterol (VENTOLIN HFA) 108 (90  Base) MCG/ACT inhaler Take 90 mcg by mouth every 4 (four) hours as needed.   Yes [provider]  apixaban (ELIQUIS) 5 MG TABS tablet Take 2 tablets ($RemoveBe'10mg'ftzxnhGGd$ ) twice daily for 7 days, then 1 tablet ($RemoveB'5mg'JKDotGBH$ ) twice daily 07/19/20  Yes Truitt Merle, MD  gabapentin (NEURONTIN) 300 MG capsule Take 1 capsule (300 mg total) by  mouth 3 (three) times daily. 05/31/20  Yes Truitt Merle, MD  ondansetron (ZOFRAN) 8 MG tablet Take 1 tablet (8 mg total) by mouth every 8 (eight) hours as needed for nausea or vomiting. 06/26/20  Yes Truitt Merle, MD  oxyCODONE ER [facility]) 13.5 MG C12A Take 13.5 mg by mouth every 12 (twelve) hours. 07/11/20  Yes Alla Feeling, NP  Oxycodone HCl 10 MG TABS Take 1 tablet (10 mg total) by mouth every 6 (six) hours as needed. OK to refill on 05/18/2020 07/22/20  Yes Truitt Merle, MD  polyethylene glycol (MIRALAX / GLYCOLAX) 17 g packet Take 17 g by mouth daily as needed for mild constipation. 04/26/20  Yes Amin, Jeanella Flattery, MD  prochlorperazine (COMPAZINE) 10 MG tablet Take 1 tablet (10 mg total) by mouth every 6 (six) hours as needed for nausea or vomiting. 06/21/20  Yes Truitt Merle, MD  dronabinol (MARINOL) 2.5 MG capsule Take 1 capsule (2.5 mg total) by mouth 2 (two) times daily before a meal. Patient not taking: Reported on 07/24/2020 06/21/20   Truitt Merle, MD  Lenvatinib 12 mg daily dose (LENVIMA) 3 x 4 MG capsule Take 12 mg by mouth daily. Start at $Remove'4mg'aCzEQBO$  daily for first week, then increase to $RemoveBef'8mg'QLItWAamye$  daily for second week and $Remove'12mg'sVDWxvh$  daily for third week if tolerates well Patient not taking: Reported on 06/21/2020 05/27/20   Truitt Merle, MD   Allergies  Allergen Reactions  . Ms Contin Cleone Slim Sulfate Er] Nausea And Vomiting   Review of Systems  Constitutional: Positive for appetite change.  Gastrointestinal: Positive for abdominal distention and abdominal pain.  Musculoskeletal: Positive for back pain.  Neurological: Positive for weakness.  Psychiatric/Behavioral: Positive for sleep disturbance.    Physical Exam  General: Alert, awake, in no acute distress.  HEENT: No bruits, no goiter, no JVD Heart: Regular rate and rhythm. No murmur appreciated. Lungs: Good air movement, clear Abdomen: Soft, globally tender, distended, positive bowel sounds.  Ext: No significant edema Skin: Warm and dry Neuro: Grossly intact, nonfocal.  Vital Signs: BP 131/76 (BP Location: Right Arm)   Pulse 89   Temp 100 F (37.8 C) (Oral)   Resp 19   Ht $R'5\' 7"'tw$  (1.702 m)   Wt 71.7 kg   SpO2 98%   BMI 24.76 kg/m  Pain Scale: 0-10 POSS *See Group Information*: S-Acceptable,Sleep, easy to arouse Pain Score: 4    SpO2: SpO2: 98 % O2 Device:SpO2: 98 % O2 Flow Rate: .O2 Flow Rate (L/min): 2 L/min  IO: Intake/output summary:   Intake/Output Summary (Last 24 hours) at 07/28/2020 7209 Last data filed at 07/27/2020 1730 Gross per 24 hour  Intake 699.5 ml  Output --  Net 699.5 ml    LBM: Last BM Date: 07/27/20 Baseline Weight: Weight: 67.9 kg Most recent weight: Weight: 71.7 kg     Palliative Assessment/Data:     Time In: 1515 Time Out: 1600 Time Total: 45 Greater than 50%  of this time was spent counseling and coordinating care related to the above assessment and plan.  Signed by: Micheline Rough, MD   Please contact Palliative Medicine Team phone at [phone] for questions and concerns.  For individual provider: See Shea Evans

## 2020-07-27 NOTE — Progress Notes (Signed)
Referring Physician(s): * No referring provider recorded for this case *  Supervising Physician: Markus Daft  Patient Status:  [facility] - In-pt  Chief Complaint: Left hepatic hemorrhage in the setting of [place]  Subjective: Patient sitting up in bed.  Alert, conversant, in good spirits.  Denies complaints related to procedure site.  Some belly pain, expected.   Allergies: Ms contin [morphine sulfate er]  Medications: Prior to Admission medications   Medication Sig Start Date End Date Taking? Authorizing Provider  albuterol (VENTOLIN HFA) 108 (90 Base) MCG/ACT inhaler Take 90 mcg by mouth every 4 (four) hours as needed.   Yes [provider]  apixaban (ELIQUIS) 5 MG TABS tablet Take 2 tablets (10mg ) twice daily for 7 days, then 1 tablet (5mg ) twice daily 07/19/20  Yes Truitt Merle, MD  gabapentin (NEURONTIN) 300 MG capsule Take 1 capsule (300 mg total) by mouth 3 (three) times daily. 05/31/20  Yes Truitt Merle, MD  ondansetron (ZOFRAN) 8 MG tablet Take 1 tablet (8 mg total) by mouth every 8 (eight) hours as needed for nausea or vomiting. 06/26/20  Yes Truitt Merle, MD  oxyCODONE ER [facility]) 13.5 MG C12A Take 13.5 mg by mouth every 12 (twelve) hours. 07/11/20  Yes Alla Feeling, NP  Oxycodone HCl 10 MG TABS Take 1 tablet (10 mg total) by mouth every 6 (six) hours as needed. OK to refill on 05/18/2020 07/22/20  Yes Truitt Merle, MD  polyethylene glycol (MIRALAX / GLYCOLAX) 17 g packet Take 17 g by mouth daily as needed for mild constipation. 04/26/20  Yes Amin, Jeanella Flattery, MD  prochlorperazine (COMPAZINE) 10 MG tablet Take 1 tablet (10 mg total) by mouth every 6 (six) hours as needed for nausea or vomiting. 06/21/20  Yes Truitt Merle, MD  dronabinol (MARINOL) 2.5 MG capsule Take 1 capsule (2.5 mg total) by mouth 2 (two) times daily before a meal. Patient not taking: Reported on 07/24/2020 06/21/20   Truitt Merle, MD  Lenvatinib 12 mg daily dose (LENVIMA) 3 x 4 MG capsule Take 12 mg by mouth daily. Start  at 4mg  daily for first week, then increase to 8mg  daily for second week and 12mg  daily for third week if tolerates well Patient not taking: Reported on 06/21/2020 05/27/20   Truitt Merle, MD     Vital Signs: BP (!) 151/76 (BP Location: Right Arm)   Pulse (!) 103   Temp 98.1 F (36.7 C) (Oral)   Resp 20   Ht 5\' 7"  (1.702 m)   Wt 158 lb 1.1 oz (71.7 kg)   SpO2 99%   BMI 24.76 kg/m   Physical Exam  NAD, alert Abdomen: distended, tender to palpation.  Groin: soft, intact.  Procedure site c/d.  Non-tender.  No evidence of pseudoaneurysm or hematoma.   Imaging: CT ABDOMEN PELVIS WO CONTRAST  Addendum Date: 07/26/2020   ADDENDUM REPORT: 07/26/2020 12:31 ADDENDUM: Critical Value/emergent results were called by telephone at the time of interpretation on 07/26/2020 at 1226 hours to Dr. Nira Conn RAI , who verbally acknowledged these results. Electronically Signed   By: Genevie Ann M.D.   On: 07/26/2020 12:31   Result Date: 07/26/2020 CLINICAL DATA:  59 year old male with left upper quadrant abdominal pain. Metastatic hepatocellular carcinoma. On Eliquis for pulmonary embolus, status post abdominal/retroperitoneal hemorrhage. Hemoglobin continues to trend down, query worsening hematoma. EXAM: CT ABDOMEN AND PELVIS WITHOUT CONTRAST TECHNIQUE: Multidetector CT imaging of the abdomen and pelvis was performed following the standard protocol without IV contrast. COMPARISON:  CT Abdomen and Pelvis 07/24/2020 without contrast. CT Abdomen and Pelvis with contrast 07/18/2020. FINDINGS: Lower chest: Stable lung bases with bronchiectasis, fibrosis and scarring. No pericardial or pleural effusion. Hepatobiliary: Increased perihepatic complex fluid compatible with hemoperitoneum. Additional details are in the Other section below. Nodular liver contour related to infiltrative tumor in both the right and left hepatic lobes again noted. And there is more hyperdense hematoma located along the undersurface of the liver a  especially at the left lobe and porta hepatis as seen on coronal image 30 today. Stable noncontrast gallbladder. Pancreas: Stable pancreatic atrophy. Spleen: Increased, moderate complex free fluid about the spleen compatible with hemoperitoneum. Layering hematocrit is evident on series 2, image 29. Stable small splenic size. Adrenals/Urinary Tract: Negative noncontrast appearance. Stomach/Bowel: No free air. No dilated large or small bowel. Some bowel loops are difficult to delineate from the moderate volume of hemoperitoneum, further detailed below. The stomach is decompressed. Vascular/Lymphatic: Vascular patency is not evaluated in the absence of IV contrast. Aortoiliac calcified atherosclerosis. Stable dense calcification in the deep pelvis which might be chronically calcified lymph node. No abdominal or pelvic lymphadenopathy. Reproductive: Negative. Other: Complex free fluid in the pelvis in the and abdominal peritoneal cavity has increased since 07/24/2020. See series 2, image 53 today compared to series 2, image 45 previously, series 2, image 75 today compared to series 2, image 68 previously). The volume is now moderate. Musculoskeletal: Lytic lesions of the right T9 vertebral posterior elements, T12 vertebral body, right L4 vertebral body and pedicle (with stable pathologic fracture as well as extraosseous and epidural extension - see series 2, image 57)), left sacral ala. Stable visualized osseous structures. IMPRESSION: 1. Increased and now moderate volume Hemoperitoneum throughout the abdomen and pelvis. Given hyperdense clot along the undersurface of the liver, favor bleeding hepatic tumor as the source of hemorrhage. 2. Grossly stable infiltrative liver tumor and complicated spinal metastatic disease since 07/24/2020. 3. Lung base Bronchiectasis and scarring. Aortic Atherosclerosis (ICD10-I70.0). Electronically Signed: By: Genevie Ann M.D. On: 07/26/2020 12:24   CT ABDOMEN PELVIS WO CONTRAST  Result  Date: 07/24/2020 CLINICAL DATA:  Acute generalized abdominal pain and distention. History of hepatocellular carcinoma. EXAM: CT ABDOMEN AND PELVIS WITHOUT CONTRAST TECHNIQUE: Multidetector CT imaging of the abdomen and pelvis was performed following the standard protocol without IV contrast. COMPARISON:  July 18, 2020. FINDINGS: Lower chest: Mild bibasilar subsegmental atelectasis is noted. Hepatobiliary: No gallstones or biliary dilatation is noted. Multiple rounded hypoechoic lesions are noted throughout the liver consistent with metastatic disease. Pancreas: Unremarkable. No pancreatic ductal dilatation or surrounding inflammatory changes. Spleen: Normal in size without focal abnormality. Adrenals/Urinary Tract: Adrenal glands are unremarkable. Kidneys are normal, without renal calculi, focal lesion, or hydronephrosis. Bladder is unremarkable. Stomach/Bowel: The stomach is unremarkable. There is no evidence of bowel obstruction or inflammation the appendix appears normal. Vascular/Lymphatic: Aortic atherosclerosis. No enlarged abdominal or pelvic lymph nodes. Reproductive: Prostate is unremarkable. Other: There is the interval development of high density fluid in the pelvis most consistent with hemorrhage. There also appears to be complex hyperdense abnormality seen anteriorly in the central portion of the abdomen, also consistent with intraperitoneal hematoma or hemorrhage. No hernia is noted. Hemorrhage is also noted inferior to the right hepatic lobe. Musculoskeletal: Large lytic destructive lesions are noted in the left sacrum, L1 and L4 vertebral bodies. The L4 lesion appears to extend into the right paraspinal soft tissues. IMPRESSION: 1. Interval development of high density fluid in the pelvis most consistent with hemorrhage.  There also appears to be complex hyperdense abnormality seen anteriorly in the central portion of the abdomen, also consistent with intraperitoneal hematoma or hemorrhage.  Hemorrhage is also noted inferior to the right hepatic lobe. Critical Value/emergent results were called by telephone at the time of interpretation on 07/24/2020 at 8:46 am to provider ADAM CURATOLO , who verbally acknowledged these results. 2. Multiple rounded hypoechoic lesions are noted throughout the liver consistent with metastatic disease. 3. Large lytic destructive lesions are noted in the left sacrum, L1 and L4 vertebral bodies consistent with metastatic disease. The L4 lesion appears to extend into the right paraspinal soft tissues. 4. Aortic atherosclerosis. Aortic Atherosclerosis (ICD10-I70.0). Electronically Signed   By: Marijo Conception M.D.   On: 07/24/2020 08:46   IR Angiogram Visceral Selective  Result Date: 07/26/2020 INDICATION: 59 year old male with a history of multifocal HCC and hemorrhagic HCC of the left abdomen. Despite acute renal dysfunction, angiogram is indicated for life-saving embolization. 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 EXAM: ULTRASOUND-GUIDED ACCESS RIGHT COMMON FEMORAL ARTERY ULTRASOUND GUIDED ACCESS RIGHT COMMON FEMORAL VEIN MESENTERIC ANGIOGRAM EMBOLIZATION OF HEMORRHAGIC LEFT HEPATIC [place] RETRIEVABLE IVC FILTER PLACEMENT. EXOSEAL DEPLOYED FOR HEMOSTASIS MEDICATIONS: 4 mg Zofran. The antibiotic was administered within 1 hour of the procedure ANESTHESIA/SEDATION: Moderate (conscious) sedation was employed during this procedure. A total of Versed 2.0 mg and Fentanyl 100 mcg was administered intravenously. Moderate Sedation Time: 60 minutes. The patient's level of consciousness and vital signs were monitored continuously by radiology nursing throughout the procedure under my direct supervision. CONTRAST:  46 cc FLUOROSCOPY TIME:  Fluoroscopy Time: 5 minutes 0 seconds COMPLICATIONS: None PROCEDURE: Informed consent was obtained from the patient following explanation of the procedure, risks, benefits and  alternatives, regarding angiogram and embolization. Specifically, the chance of contrast induced nephropathy was reviewed. The patient understands, agrees and consents for the procedure. All questions were addressed. A time out was performed prior to the initiation of the procedure. Maximal barrier sterile technique utilized including caps, mask, sterile gowns, sterile gloves, large sterile drape, hand hygiene, and Betadine prep. The procedure, risks, benefits, and alternatives of IVC filter placement were explained to the patient. Specific risks discussed include bleeding, infection, contrast reaction, renal failure, IVC filter fracture, migration, iliocaval thrombus (3-4% incidence), need for further procedure, need for further surgery, pulmonary embolism, cardiopulmonary collapse, death. Questions regarding the procedure were encouraged and answered. The patient understands and consents to the procedure. Ultrasound survey of the right inguinal region was performed with images stored and sent to PACs, confirming patency of the vessel. A micropuncture needle was used access the right common femoral artery under ultrasound. With excellent arterial blood flow returned, and an .018 micro wire was passed through the needle, observed enter the abdominal aorta under fluoroscopy. The needle was removed, and a micropuncture sheath was placed over the wire. The inner dilator and wire were removed, and an 035 Bentson wire was advanced under fluoroscopy into the abdominal aorta. The sheath was removed and a standard 5 [place] vascular sheath was placed. The dilator was removed and the sheath was flushed. Mickelson catheter was advanced on the Bentson wire. Catheter recur for was formed in the thoracic aorta. Catheter was withdrawn to the abdominal aorta and the wire was removed. Catheter was used to select the celiac artery. Angiogram was performed. High-flow catheter and a 14 fathom wire were advanced into the left gastric  artery. Wire and the catheter were advanced into the replaced  left hepatic artery. Angiogram was performed. Catheter was advanced to the division of segment 2 and segment 3. Angiogram was performed to confirm location. Embolization was then performed with a single vial of 500-700 micron embosphere to stasis. Repeat angiogram was performed. Microcatheter and the catheter were then removed. Ultrasound survey was performed of the common femoral vein with images stored and sent to PACs. Local anesthesia was provided with 1% Lidocaine. A single wall needle was used access the right common femoral vein under ultrasound. With excellent blood flow returned, a Bentson wire was passed through the needle, observed to enter the IVC under fluoroscopy. The needle was removed, and 10 [place] dilator was passed. The delivery sheath for a retrievable Bard Denali filter was passed over the Bentson wire into the IVC. The wire was removed and IVC cavagram performed. Dilator was removed, and the IVC filter was then delivered, positioned below the lowest renal vein. Repeat cavagram was not performed. The delivery sheath was removed. Manual pressure was used for hemostasis. Exoseal was deployed at this point at the common femoral artery access point. Patient tolerated the procedure well and remained hemodynamically stable throughout. No complications were encountered and no significant blood loss was encounter. FINDINGS: Ultrasound demonstrates patent common femoral vein and patent common femoral artery on the right. Angiogram demonstrates the expected arrangement of the celiac artery, with patent common hepatic artery, splenic artery, and left gastric artery. There is a replaced left hepatic artery from the left gastric artery. 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 A sentinel clot was identified on the prior CT. Embolization  was performed with 500-700 micron embospheres to stasis. After embolization was performed, retrievable IVC filter was placed below the lowest renal vein. This was deployed at the L1-L2 level. IMPRESSION: Status post ultrasound guided access right common femoral artery for hepatic angiogram and bland embolization of hemorrhagic left HCC to stasis, with Exoseal deployed for hemostasis at the common femoral artery puncture site. Status post ultrasound guided access right common femoral vein for deployment of retrievable IVC filter. Signed, Dulcy Fanny. Dellia Nims, RPVI [facility] Electronically Signed   By: Corrie Mckusick D.O.   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 Despite acute renal dysfunction, angiogram is indicated for life-saving embolization. 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 EXAM: ULTRASOUND-GUIDED ACCESS RIGHT COMMON FEMORAL ARTERY ULTRASOUND GUIDED ACCESS RIGHT COMMON FEMORAL VEIN MESENTERIC ANGIOGRAM EMBOLIZATION OF HEMORRHAGIC LEFT HEPATIC [place] RETRIEVABLE IVC FILTER PLACEMENT. EXOSEAL DEPLOYED FOR HEMOSTASIS MEDICATIONS: 4 mg Zofran. The antibiotic was administered within 1 hour of the procedure ANESTHESIA/SEDATION: Moderate (conscious) sedation was employed during this procedure. A total of Versed 2.0 mg and Fentanyl 100 mcg was administered intravenously. Moderate Sedation Time: 60 minutes. The patient's level of consciousness and vital signs were monitored continuously by radiology nursing throughout the procedure under my direct supervision. CONTRAST:  46 cc FLUOROSCOPY TIME:  Fluoroscopy Time: 5 minutes 0 seconds COMPLICATIONS: None PROCEDURE: Informed consent was obtained from the patient following explanation of the  procedure, risks, benefits and alternatives, regarding angiogram and embolization. Specifically, the chance of contrast induced nephropathy was reviewed. The patient understands, agrees and consents for the procedure. All questions were addressed. A time out was  performed prior to the initiation of the procedure. Maximal barrier sterile technique utilized including caps, mask, sterile gowns, sterile gloves, large sterile drape, hand hygiene, and Betadine prep. The procedure, risks, benefits, and alternatives of IVC filter placement were explained to the patient. Specific risks discussed include bleeding, infection, contrast reaction, renal failure, IVC filter fracture, migration, iliocaval thrombus (3-4% incidence), need for further procedure, need for further surgery, pulmonary embolism, cardiopulmonary collapse, death. Questions regarding the procedure were encouraged and answered. The patient understands and consents to the procedure. Ultrasound survey of the right inguinal region was performed with images stored and sent to PACs, confirming patency of the vessel. A micropuncture needle was used access the right common femoral artery under ultrasound. With excellent arterial blood flow returned, and an .018 micro wire was passed through the needle, observed enter the abdominal aorta under fluoroscopy. The needle was removed, and a micropuncture sheath was placed over the wire. The inner dilator and wire were removed, and an 035 Bentson wire was advanced under fluoroscopy into the abdominal aorta. The sheath was removed and a standard 5 [place] vascular sheath was placed. The dilator was removed and the sheath was flushed. Mickelson catheter was advanced on the Bentson wire. Catheter recur for was formed in the thoracic aorta. Catheter was withdrawn to the abdominal aorta and the wire was removed. Catheter was used to select the celiac artery. Angiogram was performed. High-flow catheter and a 14 fathom wire were  advanced into the left gastric artery. Wire and the catheter were advanced into the replaced left hepatic artery. Angiogram was performed. Catheter was advanced to the division of segment 2 and segment 3. Angiogram was performed to confirm location. Embolization was then performed with a single vial of 500-700 micron embosphere to stasis. Repeat angiogram was performed. Microcatheter and the catheter were then removed. Ultrasound survey was performed of the common femoral vein with images stored and sent to PACs. Local anesthesia was provided with 1% Lidocaine. A single wall needle was used access the right common femoral vein under ultrasound. With excellent blood flow returned, a Bentson wire was passed through the needle, observed to enter the IVC under fluoroscopy. The needle was removed, and 10 [place] dilator was passed. The delivery sheath for a retrievable Bard Denali filter was passed over the Bentson wire into the IVC. The wire was removed and IVC cavagram performed. Dilator was removed, and the IVC filter was then delivered, positioned below the lowest renal vein. Repeat cavagram was not performed. The delivery sheath was removed. Manual pressure was used for hemostasis. Exoseal was deployed at this point at the common femoral artery access point. Patient tolerated the procedure well and remained hemodynamically stable throughout. No complications were encountered and no significant blood loss was encounter. FINDINGS: Ultrasound demonstrates patent common femoral vein and patent common femoral artery on the right. Angiogram demonstrates the expected arrangement of the celiac artery, with patent common hepatic artery, splenic artery, and left gastric artery. There is a replaced left hepatic artery from the left gastric artery. 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 A sentinel clot was  identified on the prior CT. Embolization was performed with 500-700 micron embospheres to stasis. After embolization was performed, retrievable IVC filter was placed below the lowest renal vein. This was deployed at the L1-L2 level. IMPRESSION: Status post ultrasound guided access right common femoral artery for hepatic angiogram and bland embolization of  hemorrhagic left HCC to stasis, with Exoseal deployed for hemostasis at the common femoral artery puncture site. Status post ultrasound guided access right common femoral vein for deployment of retrievable IVC filter. Signed, Dulcy Fanny. Dellia Nims, RPVI [facility] Radiology Electronically Signed   By: Corrie Mckusick D.O.   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 Despite acute renal dysfunction, angiogram is indicated for life-saving embolization. 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 EXAM: ULTRASOUND-GUIDED ACCESS RIGHT COMMON FEMORAL ARTERY ULTRASOUND GUIDED ACCESS RIGHT COMMON FEMORAL VEIN MESENTERIC ANGIOGRAM EMBOLIZATION OF HEMORRHAGIC LEFT HEPATIC [place] RETRIEVABLE IVC FILTER PLACEMENT. EXOSEAL DEPLOYED FOR HEMOSTASIS MEDICATIONS: 4 mg Zofran. The antibiotic was administered within 1 hour of the procedure ANESTHESIA/SEDATION: Moderate (conscious) sedation was employed during this procedure. A total of Versed 2.0 mg and Fentanyl 100 mcg was administered intravenously. Moderate Sedation Time: 60 minutes. The patient's level of consciousness and vital signs were monitored continuously by radiology nursing throughout the procedure under my direct supervision. CONTRAST:  46 cc FLUOROSCOPY TIME:  Fluoroscopy Time: 5 minutes 0 seconds COMPLICATIONS: None PROCEDURE: Informed consent was obtained from  the patient following explanation of the procedure, risks, benefits and alternatives, regarding angiogram and embolization. Specifically, the chance of contrast induced nephropathy was reviewed. The patient understands, agrees and consents for the procedure. All questions were addressed. A time out was performed prior to the initiation of the procedure. Maximal barrier sterile technique utilized including caps, mask, sterile gowns, sterile gloves, large sterile drape, hand hygiene, and Betadine prep. The procedure, risks, benefits, and alternatives of IVC filter placement were explained to the patient. Specific risks discussed include bleeding, infection, contrast reaction, renal failure, IVC filter fracture, migration, iliocaval thrombus (3-4% incidence), need for further procedure, need for further surgery, pulmonary embolism, cardiopulmonary collapse, death. Questions regarding the procedure were encouraged and answered. The patient understands and consents to the procedure. Ultrasound survey of the right inguinal region was performed with images stored and sent to PACs, confirming patency of the vessel. A micropuncture needle was used access the right common femoral artery under ultrasound. With excellent arterial blood flow returned, and an .018 micro wire was passed through the needle, observed enter the abdominal aorta under fluoroscopy. The needle was removed, and a micropuncture sheath was placed over the wire. The inner dilator and wire were removed, and an 035 Bentson wire was advanced under fluoroscopy into the abdominal aorta. The sheath was removed and a standard 5 [place] vascular sheath was placed. The dilator was removed and the sheath was flushed. Mickelson catheter was advanced on the Bentson wire. Catheter recur for was formed in the thoracic aorta. Catheter was withdrawn to the abdominal aorta and the wire was removed. Catheter was used to select the celiac artery. Angiogram was performed.  High-flow catheter and a 14 fathom wire were advanced into the left gastric artery. Wire and the catheter were advanced into the replaced left hepatic artery. Angiogram was performed. Catheter was advanced to the division of segment 2 and segment 3. Angiogram was performed to confirm location. Embolization was then performed with a single vial of 500-700 micron embosphere to stasis. Repeat angiogram was performed. Microcatheter and the catheter were then removed. Ultrasound survey was performed of the common femoral vein with images stored and sent to PACs.  Local anesthesia was provided with 1% Lidocaine. A single wall needle was used access the right common femoral vein under ultrasound. With excellent blood flow returned, a Bentson wire was passed through the needle, observed to enter the IVC under fluoroscopy. The needle was removed, and 10 [place] dilator was passed. The delivery sheath for a retrievable Bard Denali filter was passed over the Bentson wire into the IVC. The wire was removed and IVC cavagram performed. Dilator was removed, and the IVC filter was then delivered, positioned below the lowest renal vein. Repeat cavagram was not performed. The delivery sheath was removed. Manual pressure was used for hemostasis. Exoseal was deployed at this point at the common femoral artery access point. Patient tolerated the procedure well and remained hemodynamically stable throughout. No complications were encountered and no significant blood loss was encounter. FINDINGS: Ultrasound demonstrates patent common femoral vein and patent common femoral artery on the right. Angiogram demonstrates the expected arrangement of the celiac artery, with patent common hepatic artery, splenic artery, and left gastric artery. There is a replaced left hepatic artery from the left gastric artery. Angiogram in the replaced left hepatic artery demonstrates contribution of segment 2 and segment 3 2 left hepatocellular carcinoma which  was hemorrhagic. A sentinel clot was identified on the prior CT. Embolization was performed with 500-700 micron embospheres to stasis. After embolization was performed, retrievable IVC filter was placed below the lowest renal vein. This was deployed at the L1-L2 level. IMPRESSION: Status post ultrasound guided access right common femoral artery for hepatic angiogram and bland embolization of hemorrhagic left HCC to stasis, with Exoseal deployed for hemostasis at the common femoral artery puncture site. Status post ultrasound guided access right common femoral vein for deployment of retrievable IVC filter. Signed, Dulcy Fanny. Dellia Nims, RPVI [facility] Radiology Electronically Signed   By: Corrie Mckusick D.O.   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 Despite acute renal dysfunction, angiogram is indicated for life-saving embolization. 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 EXAM: ULTRASOUND-GUIDED ACCESS RIGHT COMMON FEMORAL ARTERY ULTRASOUND GUIDED ACCESS RIGHT COMMON FEMORAL VEIN MESENTERIC ANGIOGRAM EMBOLIZATION OF HEMORRHAGIC LEFT HEPATIC [place] RETRIEVABLE IVC FILTER PLACEMENT. EXOSEAL DEPLOYED FOR HEMOSTASIS MEDICATIONS: 4 mg Zofran. The antibiotic was administered within 1 hour of the procedure ANESTHESIA/SEDATION: Moderate (conscious) sedation was employed during this procedure. A total of Versed 2.0 mg and Fentanyl 100 mcg was administered intravenously. Moderate Sedation Time: 60 minutes. The patient's level of consciousness and vital signs were monitored continuously by radiology nursing throughout the procedure under my direct supervision. CONTRAST:  46 cc FLUOROSCOPY TIME:  Fluoroscopy Time: 5 minutes 0 seconds COMPLICATIONS: None PROCEDURE: Informed  consent was obtained from the patient following explanation of the procedure, risks, benefits and alternatives, regarding angiogram and embolization. Specifically, the chance of contrast induced nephropathy was reviewed. The patient understands, agrees and consents for the procedure. All questions were addressed. A time out was performed prior to the initiation of the procedure. Maximal barrier sterile technique utilized including caps, mask, sterile gowns, sterile gloves, large sterile drape, hand hygiene, and Betadine prep. The procedure, risks, benefits, and alternatives of IVC filter placement were explained to the patient. Specific risks discussed include bleeding, infection, contrast reaction, renal failure, IVC filter fracture, migration, iliocaval thrombus (3-4% incidence), need for further procedure,  need for further surgery, pulmonary embolism, cardiopulmonary collapse, death. Questions regarding the procedure were encouraged and answered. The patient understands and consents to the procedure. Ultrasound survey of the right inguinal region was performed with images stored and sent to PACs, confirming patency of the vessel. A micropuncture needle was used access the right common femoral artery under ultrasound. With excellent arterial blood flow returned, and an .018 micro wire was passed through the needle, observed enter the abdominal aorta under fluoroscopy. The needle was removed, and a micropuncture sheath was placed over the wire. The inner dilator and wire were removed, and an 035 Bentson wire was advanced under fluoroscopy into the abdominal aorta. The sheath was removed and a standard 5 [place] vascular sheath was placed. The dilator was removed and the sheath was flushed. Mickelson catheter was advanced on the Bentson wire. Catheter recur for was formed in the thoracic aorta. Catheter was withdrawn to the abdominal aorta and the wire was removed. Catheter was used to select the celiac artery.  Angiogram was performed. High-flow catheter and a 14 fathom wire were advanced into the left gastric artery. Wire and the catheter were advanced into the replaced left hepatic artery. Angiogram was performed. Catheter was advanced to the division of segment 2 and segment 3. Angiogram was performed to confirm location. Embolization was then performed with a single vial of 500-700 micron embosphere to stasis. Repeat angiogram was performed. Microcatheter and the catheter were then removed. Ultrasound survey was performed of the common femoral vein with images stored and sent to PACs. Local anesthesia was provided with 1% Lidocaine. A single wall needle was used access the right common femoral vein under ultrasound. With excellent blood flow returned, a Bentson wire was passed through the needle, observed to enter the IVC under fluoroscopy. The needle was removed, and 10 [place] dilator was passed. The delivery sheath for a retrievable Bard Denali filter was passed over the Bentson wire into the IVC. The wire was removed and IVC cavagram performed. Dilator was removed, and the IVC filter was then delivered, positioned below the lowest renal vein. Repeat cavagram was not performed. The delivery sheath was removed. Manual pressure was used for hemostasis. Exoseal was deployed at this point at the common femoral artery access point. Patient tolerated the procedure well and remained hemodynamically stable throughout. No complications were encountered and no significant blood loss was encounter. FINDINGS: Ultrasound demonstrates patent common femoral vein and patent common femoral artery on the right. Angiogram demonstrates the expected arrangement of the celiac artery, with patent common hepatic artery, splenic artery, and left gastric artery. There is a replaced left hepatic artery from the left gastric artery. Angiogram in the replaced left hepatic artery demonstrates contribution of segment 2 and segment 3 2 left  hepatocellular carcinoma which was hemorrhagic. A sentinel clot was identified on the prior CT. Embolization was performed with 500-700 micron embospheres to stasis. After embolization was performed, retrievable IVC filter was placed below the lowest renal vein. This was deployed at the L1-L2 level. IMPRESSION: Status post ultrasound guided access right common femoral artery for hepatic angiogram and bland embolization of hemorrhagic left HCC to stasis, with Exoseal deployed for hemostasis at the common femoral artery puncture site. Status post ultrasound guided access right common femoral vein for deployment of retrievable IVC filter. Signed, Dulcy Fanny. Dellia Nims, RPVI [facility] Inter[facility] Radiology Specialists [facility] Radiology Electronically Signed   By: Corrie Mckusick D.O.   On: 07/26/2020 17:29   DG Chest Port 1  View  Result Date: 07/25/2020 CLINICAL DATA:  Abdominal hemorrhage EXAM: PORTABLE CHEST 1 VIEW COMPARISON:  Abdominal CT from yesterday. FINDINGS: Streaky density at the bases attributed atelectasis superimposed on chronic interstitial coarsening. There is no edema, consolidation, effusion, or pneumothorax. Normal heart size and mediastinal contours. IMPRESSION: Atelectasis at the bases as seen on preceding abdominal CT. Electronically Signed   By: Monte Fantasia M.D.   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 Despite acute renal dysfunction, angiogram is indicated for life-saving embolization. 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 EXAM: ULTRASOUND-GUIDED ACCESS RIGHT COMMON FEMORAL ARTERY ULTRASOUND GUIDED ACCESS RIGHT COMMON FEMORAL VEIN MESENTERIC ANGIOGRAM EMBOLIZATION OF HEMORRHAGIC LEFT HEPATIC [place] RETRIEVABLE IVC FILTER PLACEMENT. EXOSEAL DEPLOYED  FOR HEMOSTASIS MEDICATIONS: 4 mg Zofran. The antibiotic was administered within 1 hour of the procedure ANESTHESIA/SEDATION: Moderate (conscious) sedation was employed during this procedure. A total of Versed 2.0 mg and Fentanyl 100 mcg was administered intravenously. Moderate Sedation Time: 60 minutes. The patient's level of consciousness and vital signs were monitored continuously by radiology nursing throughout the procedure under my direct supervision. CONTRAST:  46 cc FLUOROSCOPY TIME:  Fluoroscopy Time: 5 minutes 0 seconds COMPLICATIONS: None PROCEDURE: Informed consent was obtained from the patient following explanation of the procedure, risks, benefits and alternatives, regarding angiogram and embolization. Specifically, the chance of contrast induced nephropathy was reviewed. The patient understands, agrees and consents for the procedure. All questions were addressed. A time out was performed prior to the initiation of the procedure. Maximal barrier sterile technique utilized including caps, mask, sterile gowns, sterile gloves, large sterile drape, hand hygiene, and Betadine prep. The procedure, risks, benefits, and alternatives of IVC filter placement were explained to the patient. Specific risks discussed include bleeding, infection, contrast reaction, renal failure, IVC filter fracture, migration, iliocaval thrombus (3-4% incidence), need for further procedure, need for further surgery, pulmonary embolism, cardiopulmonary collapse, death. Questions regarding the procedure were encouraged and answered. The patient understands and consents to the procedure. Ultrasound survey of the right inguinal region was performed with images stored and sent to PACs, confirming patency of the vessel. A micropuncture needle was used access the right common femoral artery under ultrasound. With excellent arterial blood flow returned, and an .018 micro wire was passed through the needle, observed enter the abdominal aorta  under fluoroscopy. The needle was removed, and a micropuncture sheath was placed over the wire. The inner dilator and wire were removed, and an 035 Bentson wire was advanced under fluoroscopy into the abdominal aorta. The sheath was removed and a standard 5 [place] vascular sheath was placed. The dilator was removed and the sheath was flushed. Mickelson catheter was advanced on the Bentson wire. Catheter recur for was formed in the thoracic aorta. Catheter was withdrawn to the abdominal aorta and the wire was removed. Catheter was used to select the celiac artery. Angiogram was performed. High-flow catheter and a 14 fathom wire were advanced into the left gastric artery. Wire and the catheter were advanced into the replaced left hepatic artery. Angiogram was performed. Catheter was advanced to the division of segment 2 and segment 3. Angiogram was performed to confirm location. Embolization was then performed with a single vial of 500-700 micron embosphere to stasis. Repeat angiogram was performed. Microcatheter and the catheter were then  removed. Ultrasound survey was performed of the common femoral vein with images stored and sent to PACs. Local anesthesia was provided with 1% Lidocaine. A single wall needle was used access the right common femoral vein under ultrasound. With excellent blood flow returned, a Bentson wire was passed through the needle, observed to enter the IVC under fluoroscopy. The needle was removed, and 10 [place] dilator was passed. The delivery sheath for a retrievable Bard Denali filter was passed over the Bentson wire into the IVC. The wire was removed and IVC cavagram performed. Dilator was removed, and the IVC filter was then delivered, positioned below the lowest renal vein. Repeat cavagram was not performed. The delivery sheath was removed. Manual pressure was used for hemostasis. Exoseal was deployed at this point at the common femoral artery access point. Patient tolerated the procedure  well and remained hemodynamically stable throughout. No complications were encountered and no significant blood loss was encounter. FINDINGS: Ultrasound demonstrates patent common femoral vein and patent common femoral artery on the right. Angiogram demonstrates the expected arrangement of the celiac artery, with patent common hepatic artery, splenic artery, and left gastric artery. There is a replaced left hepatic artery from the left gastric artery. 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 A sentinel clot was identified on the prior CT. Embolization was performed with 500-700 micron embospheres to stasis. After embolization was performed, retrievable IVC filter was placed below the lowest renal vein. This was deployed at the L1-L2 level. IMPRESSION: Status post ultrasound guided access right common femoral artery for hepatic angiogram and bland embolization of hemorrhagic left HCC to stasis, with Exoseal deployed for hemostasis at the common femoral artery puncture site. Status post ultrasound guided access right common femoral vein for deployment of retrievable IVC filter. Signed, Dulcy Fanny. Dellia Nims, RPVI Vascular and Interventional Radiology Specialists [facility] Radiology Electronically Signed   By: Corrie Mckusick D.O.   On: 07/26/2020 17:29   VAS [place] LOWER EXTREMITY VENOUS (DVT)  Result Date: 07/25/2020  Lower Venous DVTStudy Indications: Pulmonary embolism.  Risk Factors: Confirmed PE. Comparison Study: No prior studies. Performing Technologist: Oliver Hum RVT  Examination Guidelines: A complete evaluation includes B-mode imaging, spectral Doppler, color Doppler, and power Doppler as needed of all accessible portions of each vessel. Bilateral testing is considered an integral part of a complete examination. Limited examinations for reoccurring indications may be performed as noted. The  reflux portion of the exam is performed with the patient in reverse Trendelenburg.  +---------+---------------+---------+-----------+----------+--------------+ RIGHT    CompressibilityPhasicitySpontaneityPropertiesThrombus Aging +---------+---------------+---------+-----------+----------+--------------+ CFV      Full           Yes      Yes                                 +---------+---------------+---------+-----------+----------+--------------+ SFJ      Full                                                        +---------+---------------+---------+-----------+----------+--------------+ FV Prox  Full                                                        +---------+---------------+---------+-----------+----------+--------------+  FV Mid   Full                                                        +---------+---------------+---------+-----------+----------+--------------+ FV DistalFull                                                        +---------+---------------+---------+-----------+----------+--------------+ PFV      Full                                                        +---------+---------------+---------+-----------+----------+--------------+ POP      Full           Yes      Yes                                 +---------+---------------+---------+-----------+----------+--------------+ PTV      Full                                                        +---------+---------------+---------+-----------+----------+--------------+ PERO     Full                                                        +---------+---------------+---------+-----------+----------+--------------+   +---------+---------------+---------+-----------+----------+--------------+ LEFT     CompressibilityPhasicitySpontaneityPropertiesThrombus Aging +---------+---------------+---------+-----------+----------+--------------+ CFV      Full           Yes       Yes                                 +---------+---------------+---------+-----------+----------+--------------+ SFJ      Full                                                        +---------+---------------+---------+-----------+----------+--------------+ FV Prox  Full                                                        +---------+---------------+---------+-----------+----------+--------------+ FV Mid   Full                                                        +---------+---------------+---------+-----------+----------+--------------+  FV DistalFull                                                        +---------+---------------+---------+-----------+----------+--------------+ PFV      Full                                                        +---------+---------------+---------+-----------+----------+--------------+ POP      Full           Yes      Yes                                 +---------+---------------+---------+-----------+----------+--------------+ PTV      Full                                                        +---------+---------------+---------+-----------+----------+--------------+ PERO     Full                                                        +---------+---------------+---------+-----------+----------+--------------+     Summary: RIGHT: - There is no evidence of deep vein thrombosis in the lower extremity.  - No cystic structure found in the popliteal fossa.  LEFT: - There is no evidence of deep vein thrombosis in the lower extremity.  - No cystic structure found in the popliteal fossa.  *See table(s) above for measurements and observations. Electronically signed by Monica Martinez MD on 07/25/2020 at 3:24:16 PM.    Final    CT Angio Abd/Pel w/ and/or w/o  Result Date: 07/26/2020 CLINICAL DATA:  History of extensive hepatocellular carcinoma, now with concern for hepatic hemorrhage. EXAM: CTA ABDOMEN AND PELVIS WITHOUT  AND WITH CONTRAST TECHNIQUE: Multidetector CT imaging of the abdomen and pelvis was performed using the standard protocol during bolus administration of intravenous contrast. Multiplanar reconstructed images and MIPs were obtained and reviewed to evaluate the vascular anatomy. CONTRAST:  36mL OMNIPAQUE IOHEXOL 350 MG/ML SOLN COMPARISON:  CT abdomen and pelvis-earlier same day; 07/24/2020; 07/18/2020; chest CT - 07/19/2020; 04/24/2020 FINDINGS: VASCULAR Aorta: Moderate amount of mixed calcified and noncalcified atherosclerotic plaque within normal caliber abdominal aorta, not resulting in a hemodynamically significant stenosis. No abdominal aortic dissection or periaortic stranding. Celiac: There is a minimal amount of eccentric calcified and noncalcified atherosclerotic plaque involving the origin the celiac artery, not definitely resulting in hemodynamically significant stenosis. Redemonstrated non conventional celiac arterial anatomy including an accessory though dominant left hepatic artery arising from the left gastric artery. Additionally, there is a separate origin of the GDA which arises directly from the celiac artery. SMA: Widely patent without hemodynamically significant narrowing. Renals: Solitary bilaterally; the bilateral renal arteries are widely patent without a hemodynamically significant narrowing. No vessel irregularity to suggest FMD. IMA: Remains patent. Inflow:  Moderate amount of mixed calcified and noncalcified atherosclerotic plaque involves the bilateral common and external iliac arteries, not definitely resulting in hemodynamically significant stenosis. The bilateral internal iliac arteries are patent and of normal caliber. Proximal Outflow: The bilateral common and imaged portions of the bilateral superficial and deep femoral arteries appear patent and without hemodynamically significant narrowing, though note, evaluation degraded secondary to patient motion. Veins: The IVC and pelvic  venous systems appear widely patent. Review of the MIP images confirms the above findings. _________________________________________________________ NON-VASCULAR Lower chest: Limited visualization of the lower thorax demonstrates minimal bibasilar subsegmental atelectasis, right greater than left. Note is made of a near occlusive filling defect within the image right lower lobe pulmonary artery (image 10, series 4), compatible with pulmonary embolism, unchanged compared to the dedicated chest CT performed 07/19/2020. Hepatobiliary: Redemonstrated extensive multifocal hepatocellular carcinoma including near complete involvement of the posterior segment of the right lobe of the liver with infiltrative tumor. Active extravasation is seen about the caudal aspect of the lateral segment of the left lobe of the liver with subsequent pooling of extravasated contrast on acquired delayed phase images (image 79, series 4; image 79, series 6; image 82, series 7). The area of active extravasation appears to be predominantly supplied via the accessory (though dominant) left hepatic artery which arises from the left gastric artery. Portal vein remains patent. Normal appearance of the gallbladder. No radiopaque gallstones. No intra or extrahepatic biliary ductal dilatation. Redemonstrated hematoma within the ventral aspect of the upper abdominal omentum with dominant hematoma measuring approximately 10.1 x 4.5 cm (image 121, series 4), with associated moderate volume hemoperitoneum and ascites. Pancreas: Normal appearance of the pancreas. Spleen: Normal appearance of the spleen. No evidence of splenomegaly. Adrenals/Urinary Tract: There is symmetric enhancement of the bilateral kidneys. No definite renal stones on this postcontrast examination. No discrete renal lesions. No urinary obstruction or perinephric stranding. Normal appearance the bilateral adrenal glands. Normal appearance of the urinary bladder given degree distention.  Stomach/Bowel: No evidence of enteric obstruction. No pneumoperitoneum, pneumatosis or portal venous gas. Lymphatic: Scattered porta hepatis lymph nodes are not enlarged by size criteria. No bulky retroperitoneal, mesenteric, pelvic or inguinal lymphadenopathy. Reproductive: Normal appearance of the prostate gland. Other: Mild diffuse body wall anasarca. Musculoskeletal: Redemonstrated osseous metastatic disease including ill-defined lytic lesion involving the posterior elements of the right-side of the T9 vertebral body (image 34, series 4), an approximately 2.6 x 2.6 cm lytic lesion involving the T12 vertebral body (image 80, series 4), an at least 5.3 x 4.3 cm lytic lesion involving the right-side of L4 vertebral body and an approximately 3.6 x 3.5 cm lytic lesion involving the left side of the sacral ala (image 186, series). IMPRESSION: VASCULAR 1. The examination is positive for active extravasation about the caudal aspect of the lateral segment of the left lobe of the liver secondary to bleeding hepatocellular carcinoma with dominant arterial supply via an accessory (though dominant) left hepatic artery arising from the left gastric artery. 2. Redemonstrated hematoma within the ventral aspect of the upper abdominal omentum with associated moderate volume hemoperitoneum and ascites. 3. 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 NON-VASCULAR 1. Similar appearance of known multifocal hepatocellular carcinoma. The portal vein remains patent. 2. Redemonstrated extensive osseous metastatic disease as above. Above findings reviewed with Dr. Earleen Newport at the time of examination completion. Electronically Signed   By: Sandi Mariscal M.D.   On: 07/26/2020  15:38    Labs:  CBC: Recent Labs    07/25/20 0207 07/25/20 0207 07/25/20 1355 07/25/20 1926 07/26/20 0257 07/26/20 1107 07/27/20 0256 07/27/20 0734    WBC 6.9  --  8.0  --  7.7  --  7.2  --   HGB 9.0*   < > 7.7*   < > 6.5* 8.0* 6.8* 6.8*  HCT 28.8*   < > 24.4*   < > 20.5* 24.5* 20.7* 21.5*  PLT 175  --  207  --  210  --  209  --    < > = values in this interval not displayed.    COAGS: Recent Labs    04/24/20 2216 07/24/20 0810 07/24/20 1000  INR 1.0 1.5*  --   APTT  --   --  37*    BMP: Recent Labs    07/24/20 0709 07/25/20 0207 07/26/20 0257 07/27/20 0256  NA 140 137 138 137  K 4.5 4.2 4.9 4.3  CL 103 104 105 107  CO2 21* 21* 21* 20*  GLUCOSE 178* 82 108* 100*  BUN 24* 24* 32* 32*  CALCIUM 9.9 9.0 8.8* 8.5*  CREATININE 3.03* 1.75* 2.21* 1.75*  GFRNONAA 21* 41* 31* 41*  GFRAA 25* 48* 36* 48*    LIVER FUNCTION TESTS: Recent Labs    07/19/20 0651 07/24/20 0709 07/26/20 0257 07/27/20 0734  BILITOT 1.8* 2.1* 2.7* 2.1*  AST 183* 204* 252* 343*  ALT 71* 79* 96* 143*  ALKPHOS 285* 282* 230* 244*  PROT 8.0 8.0 6.6 6.6  ALBUMIN 3.2* 3.3* 2.7* 2.8*    Assessment and Plan: Hemoperitoneum in the setting of [place] and suspected bleeding hepatic tumor s/p left hepatic arteries embolization 8/27 with Dr. Earleen Newport. S/p IVC filter placement for VTE prophylaxis 8/27 with Dr. Earleen Newport.  HgB decreased further this AM to 6.5, getting PRBCs. SCr 3.03, up from 1.75 yesterday likely related to contrast-induced nephropathy.  Despite labs and intervention, patient is alert and in good spirits this AM.  States he is feeling better.  Abdominal pain with gentle palpation as expected.  Procedure sites intact.  Will trend labs over the coming days.  IR following.    Electronically Signed: Docia Barrier, PA 07/27/2020, 11:12 AM   I spent a total of 15 Minutes at the the patient's bedside AND on the patient's hospital floor or unit, greater than 50% of which was counseling/coordinating care for intra-hepatic hemorrhage

## 2020-07-27 NOTE — Progress Notes (Signed)
[facility]                                                                              Patient Demographics  Vernon [name], is a 59 y.o. male, DOB - 10/20/1961, JEH:[id number]  Admit date - 07/24/2020   Admitting Physician Brand Males, MD  Outpatient Primary MD for the patient is Lorelee Market, MD  Outpatient specialists:   LOS - 3  days   Medical records reviewed and are as summarized below:    No chief complaint on file.      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  On 8/20, patient was seen in ED for worsening shortness of breath, found to have acute PE, Eliquis was increased to 10 mg twice daily. In ED, creatinine 3.03, anion gap 16, alk phos 282, lactic acid 3, AST 204, hemoglobin 6.5, platelets 241 CT abdomen pelvis showed interval development of high density fluid in the pelvis consistent with hemorrhage complex hypodense abnormality anteriorly in the central portion of the abdomen consistent with intraperitoneal hematoma or hemorrhage.  Multiple lesions throughout the liver consistent with metastatic disease, large lytic destructive lesion in left sacrum L1 L4 vertebral bodies consistent with metastatic disease. Patient was hypotensive in ED with SBP of 83, was transfused 2 units packed RBC, BP improved, IR was consulted for possible embolization.  Patient was admitted by CCM to ICU.  8/26, had vasovagal episode, stat H&H stable 8/27, hemoglobin down to 6.5, CT abdomen pelvis showed increase in moderate volume hemoperitoneum.  Status post mesenteric angiogram, embolization of the left hepatic arteries, retrievable IVC filter placement 8/28 doing well, LFTs trending up, creatinine stable 1.7, improving from 2.2 on 8/27, hemoglobin 6.8  Assessment & Plan    Active Problems:    Abdominal pain with Intraperitoneal hemorrhage in the setting of anticoagulation, hypotensive, hemorrhagic shock -Patient was on Eliquis, recently increased on 8/24 new PE presented with abdominal pain and found to have large intraperitoneal hemorrhage -Patient was transfused 2 units packed RBCs, anticoagulation held, admitted by CCM and IR was consulted.  On 8/26, -On 8/27, hemoglobin down to 6.5, Stat CT abd pelvis showed increase in hemoperitoneum throughout the abdomen and pelvis, favored bleeding hepatic tumor as a source of hemorrhage -IR consulted, status postmesenteric angiogram, embolization of the left hepatic arteries, retrievable IVC filter placement -LFTs trending up, creatinine currently stable, hemoglobin down to 6.8, transfuse 2 units packed RBC  History of recent PE, coagulopathy on Eliquis -Eliquis now held, status post Kcentra reversal in the ED -Received 2units packed RBCs with FFP,  1 unit packed RBCs on 8/27, transfusing 2 units on 8/28 - s/p retrievable IVC filter placement on 8/27  Acute blood loss anemia Hemoglobin 6.5 at the time of admission, baseline 8- 10, received 2 units pRBC's on admission, 1 unit on 8/27, transfusing 2 units on 8/28 for hemoglobin of 6.8    AKI (acute kidney injury) ([place]) with lactic acidosis -Due to acute blood  loss anemia and hemorrhagic shock, baseline creatinine 1.2 -Creatinine on presentation 3.0, improving -Received 2 units packed RBCs on admission, 1 unit 8/27 -Due to concern for contrast nephropathy, nephrology has been consulted as well.  Creatinine improving to 1.7 today, follow closely  Metastatic hepatocellular carcinoma -Followed by Dr. Burr Medico, initial diagnosis in 06/2018, metastatic, status post XRT, on chemotherapy, (lenvatinib), intermittently -Oncology consulted, seen by Dr. Burr Medico, had recently discussed switching his treatment to immunotherapy and bevacizumab, contraindicated due to PE and bleeding.  Overall prognosis  poor  Chronic pain secondary to malignancy Continue pain control Palliative medicine consult for goals of care    Code Status: full  DVT Prophylaxis:   SCD's Family Communication: Discussed all imaging results, lab results, explained to the patient and daughter on phone today   Disposition Plan:     Status is: Inpatient  Remains inpatient appropriate because:Inpatient level of care appropriate due to severity of illness   Dispo: The patient is from: Home              Anticipated d/c is to: TBD              Anticipated d/c date is: 2 days              Patient currently is not medically stable to d/c.  Status post embolization and IVC filter placement on 8/27, hemoglobin still down, LFTs trending up   Time Spent in minutes 35 minutes  Procedures:  CT abd pelvis   Consultants:   Admitted by [facility] Intervention radiology Oncology Interventional radiology Nephrology   Antimicrobials:   Anti-infectives (From admission, onward)   Start     Dose/Rate Route Frequency Ordered Stop   07/26/20 1700  piperacillin-tazobactam (ZOSYN) IVPB 3.375 g        3.375 g 100 mL/hr over 30 Minutes Intravenous  Once 07/26/20 1659 07/26/20 1729         Medications  Scheduled Meds: . sodium chloride   Intravenous Once  . Chlorhexidine Gluconate Cloth  6 each Topical Daily  . mouth rinse  15 mL Mouth Rinse BID  . oxyCODONE  20 mg Oral Q12H   Continuous Infusions: . sodium chloride 100 mL/hr at 07/27/20 1320   PRN Meds:.docusate sodium, hydrALAZINE, ondansetron (ZOFRAN) IV, oxyCODONE, polyethylene glycol      Subjective:   Vernon [name] was seen and examined today.  Patient in high spirits, looking forward to eat solid food.  No nausea vomiting, fevers or chills.  Mildly tender abdomen.   Patient denies dizziness, chest pain, shortness of breath. ,    Objective:   Vitals:   07/27/20 0936 07/27/20 1007 07/27/20 1200 07/27/20 1230  BP: (!) 151/73 (!) 151/76 (!) 142/74 138/74   Pulse: 98 (!) 103 94 90  Resp: _0 Temp: 97.9 F (36.6 C) 98.1 F (36.7 C)  98.1 F (36.7 C)  TempSrc: Oral Oral  Oral  SpO2: 100% 99% 99% 100%  Weight:      Height:        Intake/Output Summary (Last 24 hours) at 07/27/2020 1337 Last data filed at 07/27/2020 1225 Gross per 24 hour  Intake 1982.38 ml  Output 300 ml  Net 1682.38 ml     Wt Readings from Last 3 Encounters:  07/27/20 71.7 kg  07/19/20 70.8 kg  07/19/20 70.8 kg   Physical Exam  General: Alert and oriented x 3, NAD  Cardiovascular: S1 S2 clear, RRR. No pedal edema b/l  Respiratory: CTAB,  no wheezing, rales or rhonchi  Gastrointestinal: Soft, diffuse TTP,  nondistended, NBS  Ext: no pedal edema bilaterally  Neuro: no new deficits  Musculoskeletal: No cyanosis, clubbing  Skin: No rashes  Psych: Normal affect and demeanor, alert and oriented x3     Data Reviewed:  I have personally reviewed following labs and imaging studies  Micro Results Recent Results (from the past 240 hour(s))  Blood culture (routine x 2)     Status: None (Preliminary result)   Collection Time: 07/24/20  8:10 AM   Specimen: BLOOD RIGHT HAND  Result Value Ref Range Status   Specimen Description   Final    BLOOD RIGHT HAND Performed at [facility], [place] [address] [place], [place] [address]    Special Requests   Final    BOTTLES DRAWN AEROBIC AND ANAEROBIC Blood Culture results may not be optimal due to an inadequate volume of blood received in culture bottles Performed at [place] [address], [place], [place] [address]    Culture   Final    NO GROWTH 3 DAYS Performed at [facility], [place] [address], [place], [place] [address]    Report Status PENDING  Incomplete  Blood culture (routine x 2)     Status: None (Preliminary result)   Collection Time: 07/24/20  8:15 AM   Specimen: BLOOD  Result Value Ref Range Status   Specimen Description   Final    BLOOD  LEFT WRIST Performed at [place] [address] [place], [place] [address]    Special Requests   Final    BOTTLES DRAWN AEROBIC ONLY Blood Culture adequate volume Performed at [place] [address], [place], [place] [address]    Culture   Final    NO GROWTH 3 DAYS Performed at [facility], [place] [address], [place], [place] [address]    Report Status PENDING  Incomplete  SARS Coronavirus 2 by RT PCR (hospital order, performed in [facility] hospital lab) Nasopharyngeal Nasopharyngeal Swab     Status: None   Collection Time: 07/24/20  8:17 AM   Specimen: Nasopharyngeal Swab  Result Value Ref Range Status   SARS Coronavirus 2 NEGATIVE NEGATIVE Final    Comment: (NOTE) SARS-CoV-2 target nucleic acids are NOT DETECTED.  The SARS-CoV-2 RNA is generally detectable in upper and lower respiratory specimens during the acute phase of infection. The lowest concentration of SARS-CoV-2 viral copies this assay can detect is 250 copies / mL. A negative result does not preclude SARS-CoV-2 infection and should not be used as the sole basis for treatment or other patient management decisions.  A negative result may occur with improper specimen collection / handling, submission of specimen other than nasopharyngeal swab, presence of viral mutation(s) within the areas targeted by this assay, and inadequate number of viral copies (<250 copies / mL). A negative result must be combined with clinical observations, patient history, and epidemiological information.  Fact Sheet for Patients:   StrictlyIdeas.no  Fact Sheet for Healthcare Providers: BankingDealers.co.za  This test is not yet approved or  cleared by the [place] FDA and has been authorized for detection and/or diagnosis of SARS-CoV-2 by FDA under an Emergency Use Authorization (EUA).  This EUA will remain in effect (meaning this test  can be used) for the duration of the COVID-19 declaration under Section 564(b)(1) of the Act, 21 U.S.C. section 360bbb-3(b)(1), unless the authorization is terminated or revoked sooner.  Performed at [facility], [place] [address], [place], [place] [address]   MRSA PCR Screening     Status: None   Collection Time: 07/24/20  5:19 PM   Specimen: Nasopharyngeal  Result Value Ref Range Status   MRSA by PCR NEGATIVE NEGATIVE Final    Comment:        The GeneXpert MRSA Assay (FDA approved for NASAL specimens only), is one component of a comprehensive MRSA colonization surveillance program. It is not intended to diagnose MRSA infection nor to guide or monitor treatment for MRSA infections. Performed at [facility], [place] [address], [place], [place] [address]     Radiology Reports CT ABDOMEN PELVIS WO CONTRAST  Addendum Date: 07/26/2020   ADDENDUM REPORT: 07/26/2020 12:31 ADDENDUM: Critical Value/emergent results were called by telephone at the time of interpretation on 07/26/2020 at 1226 hours to Dr. Nira Conn Sadi Arave , who verbally acknowledged these results. Electronically Signed   By: Genevie Ann M.D.   On: 07/26/2020 12:31   Result Date: 07/26/2020 CLINICAL DATA:  59 year old male with left upper quadrant abdominal pain. Metastatic hepatocellular carcinoma. On Eliquis for pulmonary embolus, status post abdominal/retroperitoneal hemorrhage. Hemoglobin continues to trend down, query worsening hematoma. EXAM: CT ABDOMEN AND PELVIS WITHOUT CONTRAST TECHNIQUE: Multidetector CT imaging of the abdomen and pelvis was performed following the standard protocol without IV contrast. COMPARISON:  CT Abdomen and Pelvis 07/24/2020 without contrast. CT Abdomen and Pelvis with contrast 07/18/2020. FINDINGS: Lower chest: Stable lung bases with bronchiectasis, fibrosis and scarring. No pericardial or pleural effusion. Hepatobiliary: Increased perihepatic complex fluid compatible  with hemoperitoneum. Additional details are in the Other section below. Nodular liver contour related to infiltrative tumor in both the right and left hepatic lobes again noted. And there is more hyperdense hematoma located along the undersurface of the liver a especially at the left lobe and porta hepatis as seen on coronal image 30 today. Stable noncontrast gallbladder. Pancreas: Stable pancreatic atrophy. Spleen: Increased, moderate complex free fluid about the spleen compatible with hemoperitoneum. Layering hematocrit is evident on series 2, image 29. Stable small splenic size. Adrenals/Urinary Tract: Negative noncontrast appearance. Stomach/Bowel: No free air. No dilated large or small bowel. Some bowel loops are difficult to delineate from the moderate volume of hemoperitoneum, further detailed below. The stomach is decompressed. Vascular/Lymphatic: Vascular patency is not evaluated in the absence of IV contrast. Aortoiliac calcified atherosclerosis. Stable dense calcification in the deep pelvis which might be chronically calcified lymph node. No abdominal or pelvic lymphadenopathy. Reproductive: Negative. Other: Complex free fluid in the pelvis in the and abdominal peritoneal cavity has increased since 07/24/2020. See series 2, image 53 today compared to series 2, image 45 previously, series 2, image 75 today compared to series 2, image 68 previously). The volume is now moderate. Musculoskeletal: Lytic lesions of the right T9 vertebral posterior elements, T12 vertebral body, right L4 vertebral body and pedicle (with stable pathologic fracture as well as extraosseous and epidural extension - see series 2, image 57)), left sacral ala. Stable visualized osseous structures. IMPRESSION: 1. Increased and now moderate volume Hemoperitoneum throughout the abdomen and pelvis. Given hyperdense clot along the undersurface of the liver, favor bleeding hepatic tumor as the source of hemorrhage. 2. Grossly stable  infiltrative liver tumor and complicated spinal metastatic disease since 07/24/2020. 3. Lung base Bronchiectasis and scarring. Aortic Atherosclerosis (ICD10-I70.0). Electronically Signed: By: Genevie Ann M.D. On: 07/26/2020 12:24   CT ABDOMEN PELVIS WO CONTRAST  Result Date: 07/24/2020 CLINICAL DATA:  Acute generalized abdominal pain and distention. History of hepatocellular carcinoma. EXAM: CT ABDOMEN AND PELVIS WITHOUT CONTRAST TECHNIQUE: Multidetector CT imaging of the abdomen and pelvis was performed following the standard protocol without IV contrast. COMPARISON:  July 18, 2020. FINDINGS: Lower chest: Mild bibasilar subsegmental atelectasis is noted. Hepatobiliary: No gallstones or biliary dilatation is noted. Multiple rounded hypoechoic lesions are noted throughout the liver consistent with metastatic disease. Pancreas: Unremarkable. No pancreatic ductal dilatation or surrounding inflammatory changes. Spleen: Normal in size without focal abnormality. Adrenals/Urinary Tract: Adrenal glands are unremarkable. Kidneys are normal, without renal calculi, focal lesion, or hydronephrosis. Bladder is unremarkable. Stomach/Bowel: The stomach is unremarkable. There is no evidence of bowel obstruction or inflammation the appendix appears normal. Vascular/Lymphatic: Aortic atherosclerosis. No enlarged abdominal or pelvic lymph nodes. Reproductive: Prostate is unremarkable. Other: There is the interval development of high density fluid in the pelvis most consistent with hemorrhage. There also appears to be complex hyperdense abnormality seen anteriorly in the central portion of the abdomen, also consistent with intraperitoneal hematoma or hemorrhage. No hernia is noted. Hemorrhage is also noted inferior to the right hepatic lobe. Musculoskeletal: Large lytic destructive lesions are noted in the left sacrum, L1 and L4 vertebral bodies. The L4 lesion appears to extend into the right paraspinal soft tissues. IMPRESSION: 1.  Interval development of high density fluid in the pelvis most consistent with hemorrhage. There also appears to be complex hyperdense abnormality seen anteriorly in the central portion of the abdomen, also consistent with intraperitoneal hematoma or hemorrhage. Hemorrhage is also noted inferior to the right hepatic lobe. Critical Value/emergent results were called by telephone at the time of interpretation on 07/24/2020 at 8:46 am to provider ADAM CURATOLO , who verbally acknowledged these results. 2. Multiple rounded hypoechoic lesions are noted throughout the liver consistent with metastatic disease. 3. Large lytic destructive lesions are noted in the left sacrum, L1 and L4 vertebral bodies consistent with metastatic disease. The L4 lesion appears to extend into the right paraspinal soft tissues. 4. Aortic atherosclerosis. Aortic Atherosclerosis (ICD10-I70.0). Electronically Signed   By: Marijo Conception M.D.   On: 07/24/2020 08:46   DG Chest 2 View  Result Date: 07/19/2020 CLINICAL DATA:  59 year old male with shortness of breath. EXAM: CHEST - 2 VIEW COMPARISON:  Chest radiograph dated 07/19/2020. FINDINGS: No focal consolidation, pleural effusion, or pneumothorax. The cardiac silhouette is within limits. Atherosclerotic calcification of the aorta. No acute osseous pathology. Degenerative changes of the spine and bilateral AC joints. IMPRESSION: No active cardiopulmonary disease. Electronically Signed   By: Anner Crete M.D.   On: 07/19/2020 21:59   DG Chest 2 View  Result Date: 07/19/2020 CLINICAL DATA:  Shortness of breath EXAM: CHEST - 2 VIEW COMPARISON:  Chest CT 04/24/2020 FINDINGS: Normal heart size and mediastinal contours. No acute infiltrate or edema. No effusion or pneumothorax. Known osseous metastatic disease by CT. No acute osseous findings. IMPRESSION: No acute finding. Electronically Signed   By: Monte Fantasia M.D.   On: 07/19/2020 07:25   CT Angio Chest PE W and/or Wo  Contrast  Result Date: 07/19/2020 CLINICAL DATA:  Shortness of breath. Possible PE seen on CT 1 day ago. Stage IV multifocal hepatocellular carcinoma. EXAM: CT ANGIOGRAPHY CHEST WITH CONTRAST TECHNIQUE: Multidetector CT imaging of the chest was performed using the standard protocol during bolus administration of intravenous contrast. Multiplanar CT image reconstructions and MIPs were obtained to evaluate the vascular anatomy. CONTRAST:  20m OMNIPAQUE IOHEXOL 350 MG/ML SOLN COMPARISON:  CT abdomen-pelvis  07/18/2020.  CT chest 04/24/2020 FINDINGS: Cardiovascular: Satisfactory opacification of the pulmonary arteries to the segmental level. Chronic nonocclusive filling defect within the lobar and segmental branches of the right lower lobe pulmonary artery (series 4, images 50-52). The thrombus burden has decreased compared to prior CT 04/24/2020. No additional filling defects are identified. No central PE. Normal RV to LV ratio without evidence of right heart strain. Thoracic aorta is nonaneurysmal. Atherosclerotic calcification of the aortic arch. Normal heart size. No pericardial effusion. Mediastinum/Nodes: No enlarged mediastinal, hilar, or axillary lymph nodes. Thyroid gland, trachea, and esophagus demonstrate no significant findings. Lungs/Pleura: Similar mild-moderate emphysematous lung changes. No focal airspace consolidation. No pulmonary nodules or masses. No pleural effusion or pneumothorax. Upper Abdomen: Numerous confluent heterogeneous enhancing liver masses, as detailed on CT abdomen pelvis 07/18/2020. No new or acute findings within the visualized portion of the upper abdomen. Musculoskeletal: Lytic metastatic lesion within the left portion of the T12 vertebral body. Expansile lytic lesion centered within the right T9 transverse process (series 4, image 61). No definite new areas of osseous metastasis are identified. No acute fracture. Review of the MIP images confirms the above findings. IMPRESSION:  1. Chronic nonocclusive filling defect within the lobar and segmental branches of the right lower lobe pulmonary artery. The thrombus burden has decreased compared to prior CT 04/24/2020. No additional filling defects are identified. No evidence of right heart strain. 2. Lungs are clear. 3. Osseous metastatic disease as described above. 4. Numerous confluent heterogeneous enhancing liver masses, as detailed on CT abdomen pelvis 07/18/2020. Aortic Atherosclerosis (ICD10-I70.0) and Emphysema (ICD10-J43.9). Electronically Signed   By: Davina Poke D.O.   On: 07/19/2020 09:06   NM Bone Scan Whole Body  Result Date: 07/18/2020 CLINICAL DATA:  Hepatocellular carcinoma.  Known bony metastasis EXAM: NUCLEAR MEDICINE WHOLE BODY BONE SCAN TECHNIQUE: Whole body anterior and posterior images were obtained approximately 3 hours after intravenous injection of radiopharmaceutical. RADIOPHARMACEUTICALS:  19.6 mCi Technetium-82mMDP IV COMPARISON:  Bone scan 05/20/2020 FINDINGS: Physiologic distribution of radiotracer with activity in the bilateral kidneys and urinary bladder. Faint uptake is seen within the right T9 pedicle. Faint uptake is also seen within the T12 vertebral body. No appreciable radiotracer accumulation within the L4 vertebrae or left hemi sacrum at sites of known metastatic disease. Similar distribution of degenerative uptake including within the bilateral shoulders and knees. IMPRESSION: 1. Faint uptake within the T9 and T12 vertebrae at sites of known metastatic disease. 2. No appreciable focal uptake is seen within the L4 vertebrae or left hemi sacrum at sites of known osseous metastatic disease. These lesions are more evident on CT. Electronically Signed   By: NDavina PokeD.O.   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 History of palliative radiation at T9, T10 and L4. Mild  going medical therapy. Restaging. EXAM: CT ABDOMEN AND PELVIS WITH CONTRAST TECHNIQUE: Multidetector CT imaging of the abdomen and pelvis was performed using the standard protocol following bolus administration of intravenous contrast. CONTRAST:  1018mOMNIPAQUE IOHEXOL 300 MG/ML  SOLN COMPARISON:  04/24/2020 CT chest, abdomen and pelvis. FINDINGS: Lower chest: Centrilobular emphysema at the lung bases. Low signal filling defect centrally in the right lower lobe pulmonary artery branch on the uppermost slice (series 2/image 1), potentially acute pulmonary embolus. Hepatobiliary: Numerous bulky confluent heterogeneous hyperenhancing liver masses throughout the liver, increased since 04/24/2020 CT. Representative liver masses as follows: -segment 3  left liver lobe 4.6 x 4.4 cm mass (series 2/image 51), previously 4.3 x 3.7 cm -segment 2 left liver lobe 2.7 x 2.2 cm mass (series 2/image 31), previously 2.0 x 1.5 cm -segment 5 right liver 3.6 x 3.2 cm mass (series 2/image 62), previously 3.0 x 2.5 cm Normal gallbladder with no radiopaque cholelithiasis. No biliary ductal dilatation. Pancreas: Normal, with no mass or duct dilation. Spleen: Normal size. No mass. Adrenals/Urinary Tract: Normal adrenals. Normal kidneys with no hydronephrosis and no renal mass. Normal bladder. Stomach/Bowel: Normal non-distended stomach. Normal caliber small bowel with no small bowel wall thickening. Normal appendix. Normal large bowel with no diverticulosis, large bowel wall thickening or pericolonic fat stranding. Vascular/Lymphatic: Atherosclerotic nonaneurysmal abdominal aorta. Patent portal, splenic, hepatic and renal veins. No pathologically enlarged lymph nodes in the abdomen or pelvis. Reproductive: Normal size prostate. Other: No pneumoperitoneum, ascites or focal fluid collection. Musculoskeletal: Multiple lytic bone metastases scattered in the visualized spine and sacrum, increased. Representative expansile right L4 vertebral 6.4  x 5.7 cm lesion (series 12/image 48), increased from 6.0 x 4.9 cm. Representative left upper sacral 3.4 x 3.0 cm lesion (series 12/image 63), not previously imaged. Represent of left T12 2.9 x 2.8 cm lesion (series 12/image 23), increased from 2.4 x 2.3 cm. Marked lumbar spondylosis. IMPRESSION: 1. Probable acute pulmonary embolus partially visualized at the right lung base. Further evaluation with PE protocol CT chest angiogram may be obtained as clinically warranted. 2. Widespread liver metastases, increased. 3. Multiple lytic bone metastases, increased. 4. Aortic Atherosclerosis (ICD10-I70.0) and Emphysema (ICD10-J43.9). Critical Value/emergent results were called by telephone at the time of interpretation on 07/18/2020 at 12:54 pm to provider Truitt Merle , who verbally acknowledged these results. Electronically Signed   By: Ilona Sorrel M.D.   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 Despite acute renal dysfunction, angiogram is indicated for life-saving embolization. 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 EXAM: ULTRASOUND-GUIDED ACCESS RIGHT COMMON FEMORAL ARTERY ULTRASOUND GUIDED ACCESS RIGHT COMMON FEMORAL VEIN MESENTERIC ANGIOGRAM EMBOLIZATION OF HEMORRHAGIC LEFT HEPATIC [place] RETRIEVABLE IVC FILTER PLACEMENT. EXOSEAL DEPLOYED FOR HEMOSTASIS MEDICATIONS: 4 mg Zofran. The antibiotic was administered within 1 hour of the procedure ANESTHESIA/SEDATION: Moderate (conscious) sedation was employed during this procedure. A total of Versed 2.0 mg and Fentanyl 100 mcg was administered intravenously. Moderate Sedation Time: 60 minutes. The patient's level of consciousness and vital signs were monitored continuously by radiology nursing throughout the procedure under my direct supervision. CONTRAST:  46 cc  FLUOROSCOPY TIME:  Fluoroscopy Time: 5 minutes 0 seconds COMPLICATIONS: None PROCEDURE: Informed consent was obtained from the patient following explanation of the procedure, risks, benefits and alternatives, regarding angiogram and embolization. Specifically, the chance of contrast induced nephropathy was reviewed. The patient understands, agrees and consents for the procedure. All questions were addressed. A time out was performed prior to the initiation of the procedure. Maximal barrier sterile technique utilized including caps, mask, sterile gowns, sterile gloves, large sterile drape, hand hygiene, and Betadine prep. The procedure, risks, benefits, and alternatives of IVC filter placement were explained to the patient. Specific risks discussed include bleeding, infection, contrast reaction, renal failure, IVC filter fracture, migration, iliocaval thrombus (3-4% incidence), need for further procedure, need for further surgery, pulmonary embolism, cardiopulmonary collapse, death. Questions regarding the procedure were encouraged and answered. The patient understands and consents  to the procedure. Ultrasound survey of the right inguinal region was performed with images stored and sent to PACs, confirming patency of the vessel. A micropuncture needle was used access the right common femoral artery under ultrasound. With excellent arterial blood flow returned, and an .018 micro wire was passed through the needle, observed enter the abdominal aorta under fluoroscopy. The needle was removed, and a micropuncture sheath was placed over the wire. The inner dilator and wire were removed, and an 035 Bentson wire was advanced under fluoroscopy into the abdominal aorta. The sheath was removed and a standard 5 [place] vascular sheath was placed. The dilator was removed and the sheath was flushed. Mickelson catheter was advanced on the Bentson wire. Catheter recur for was formed in the thoracic aorta. Catheter was withdrawn to  the abdominal aorta and the wire was removed. Catheter was used to select the celiac artery. Angiogram was performed. High-flow catheter and a 14 fathom wire were advanced into the left gastric artery. Wire and the catheter were advanced into the replaced left hepatic artery. Angiogram was performed. Catheter was advanced to the division of segment 2 and segment 3. Angiogram was performed to confirm location. Embolization was then performed with a single vial of 500-700 micron embosphere to stasis. Repeat angiogram was performed. Microcatheter and the catheter were then removed. Ultrasound survey was performed of the common femoral vein with images stored and sent to PACs. Local anesthesia was provided with 1% Lidocaine. A single wall needle was used access the right common femoral vein under ultrasound. With excellent blood flow returned, a Bentson wire was passed through the needle, observed to enter the IVC under fluoroscopy. The needle was removed, and 10 [place] dilator was passed. The delivery sheath for a retrievable Bard Denali filter was passed over the Bentson wire into the IVC. The wire was removed and IVC cavagram performed. Dilator was removed, and the IVC filter was then delivered, positioned below the lowest renal vein. Repeat cavagram was not performed. The delivery sheath was removed. Manual pressure was used for hemostasis. Exoseal was deployed at this point at the common femoral artery access point. Patient tolerated the procedure well and remained hemodynamically stable throughout. No complications were encountered and no significant blood loss was encounter. FINDINGS: Ultrasound demonstrates patent common femoral vein and patent common femoral artery on the right. Angiogram demonstrates the expected arrangement of the celiac artery, with patent common hepatic artery, splenic artery, and left gastric artery. There is a replaced left hepatic artery from the left gastric artery. 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 A sentinel clot was identified on the prior CT. Embolization was performed with 500-700 micron embospheres to stasis. After embolization was performed, retrievable IVC filter was placed below the lowest renal vein. This was deployed at the L1-L2 level. IMPRESSION: Status post ultrasound guided access right common femoral artery for hepatic angiogram and bland embolization of hemorrhagic left HCC to stasis, with Exoseal deployed for hemostasis at the common femoral artery puncture site. Status post ultrasound guided access right common femoral vein for deployment of retrievable IVC filter. Signed, Dulcy Fanny. Dellia Nims, RPVI [facility] Radiology Electronically Signed   By: Corrie Mckusick D.O.   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  Despite acute renal dysfunction, angiogram is indicated for life-saving embolization. 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 EXAM: ULTRASOUND-GUIDED ACCESS RIGHT COMMON FEMORAL ARTERY ULTRASOUND GUIDED ACCESS RIGHT COMMON FEMORAL VEIN MESENTERIC ANGIOGRAM EMBOLIZATION OF HEMORRHAGIC LEFT HEPATIC [place] RETRIEVABLE IVC FILTER PLACEMENT. EXOSEAL DEPLOYED FOR HEMOSTASIS MEDICATIONS: 4 mg Zofran. The antibiotic was administered within 1 hour of the procedure ANESTHESIA/SEDATION: Moderate (conscious) sedation was employed during this procedure. A total of Versed 2.0 mg and Fentanyl 100 mcg was administered intravenously. Moderate Sedation Time: 60 minutes. The patient's level of consciousness and vital signs were monitored continuously by radiology nursing throughout the procedure under my  direct supervision. CONTRAST:  46 cc FLUOROSCOPY TIME:  Fluoroscopy Time: 5 minutes 0 seconds COMPLICATIONS: None PROCEDURE: Informed consent was obtained from the patient following explanation of the procedure, risks, benefits and alternatives, regarding angiogram and embolization. Specifically, the chance of contrast induced nephropathy was reviewed. The patient understands, agrees and consents for the procedure. All questions were addressed. A time out was performed prior to the initiation of the procedure. Maximal barrier sterile technique utilized including caps, mask, sterile gowns, sterile gloves, large sterile drape, hand hygiene, and Betadine prep. The procedure, risks, benefits, and alternatives of IVC filter placement were explained to the patient. Specific risks discussed include bleeding, infection, contrast reaction, renal failure, IVC filter fracture, migration, iliocaval thrombus (3-4% incidence), need for further procedure, need for further surgery, pulmonary embolism, cardiopulmonary collapse, death. Questions regarding the procedure were encouraged and answered. The patient understands and consents to the procedure. Ultrasound survey of the right inguinal region was performed with images stored and sent to PACs, confirming patency of the vessel. A micropuncture needle was used access the right common femoral artery under ultrasound. With excellent arterial blood flow returned, and an .018 micro wire was passed through the needle, observed enter the abdominal aorta under fluoroscopy. The needle was removed, and a micropuncture sheath was placed over the wire. The inner dilator and wire were removed, and an 035 Bentson wire was advanced under fluoroscopy into the abdominal aorta. The sheath was removed and a standard 5 [place] vascular sheath was placed. The dilator was removed and the sheath was flushed. Mickelson catheter was advanced on the Bentson wire. Catheter recur for was formed in the  thoracic aorta. Catheter was withdrawn to the abdominal aorta and the wire was removed. Catheter was used to select the celiac artery. Angiogram was performed. High-flow catheter and a 14 fathom wire were advanced into the left gastric artery. Wire and the catheter were advanced into the replaced left hepatic artery. Angiogram was performed. Catheter was advanced to the division of segment 2 and segment 3. Angiogram was performed to confirm location. Embolization was then performed with a single vial of 500-700 micron embosphere to stasis. Repeat angiogram was performed. Microcatheter and the catheter were then removed. Ultrasound survey was performed of the common femoral vein with images stored and sent to PACs. Local anesthesia was provided with 1% Lidocaine. A single wall needle was used access the right common femoral vein under ultrasound. With excellent blood flow returned, a Bentson wire was passed through the needle, observed to enter the IVC under fluoroscopy. The needle was removed, and 10 [place] dilator was passed. The delivery sheath for a retrievable Bard Denali filter was passed over the Bentson wire into the IVC. The wire was removed and IVC cavagram performed. Dilator was removed, and the IVC filter was then delivered, positioned below the  lowest renal vein. Repeat cavagram was not performed. The delivery sheath was removed. Manual pressure was used for hemostasis. Exoseal was deployed at this point at the common femoral artery access point. Patient tolerated the procedure well and remained hemodynamically stable throughout. No complications were encountered and no significant blood loss was encounter. FINDINGS: Ultrasound demonstrates patent common femoral vein and patent common femoral artery on the right. Angiogram demonstrates the expected arrangement of the celiac artery, with patent common hepatic artery, splenic artery, and left gastric artery. There is a replaced left hepatic artery from the  left gastric artery. 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 A sentinel clot was identified on the prior CT. Embolization was performed with 500-700 micron embospheres to stasis. After embolization was performed, retrievable IVC filter was placed below the lowest renal vein. This was deployed at the L1-L2 level. IMPRESSION: Status post ultrasound guided access right common femoral artery for hepatic angiogram and bland embolization of hemorrhagic left HCC to stasis, with Exoseal deployed for hemostasis at the common femoral artery puncture site. Status post ultrasound guided access right common femoral vein for deployment of retrievable IVC filter. Signed, Dulcy Fanny. Dellia Nims, RPVI [facility] and Inter[facility] Radiology Specialists [facility] Radiology Electronically Signed   By: Corrie Mckusick D.O.   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 Despite acute renal dysfunction, angiogram is indicated for life-saving embolization. 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 EXAM: ULTRASOUND-GUIDED ACCESS RIGHT COMMON FEMORAL ARTERY ULTRASOUND GUIDED ACCESS RIGHT COMMON FEMORAL VEIN MESENTERIC ANGIOGRAM EMBOLIZATION OF HEMORRHAGIC LEFT HEPATIC [place] RETRIEVABLE IVC FILTER PLACEMENT. EXOSEAL DEPLOYED FOR HEMOSTASIS MEDICATIONS: 4 mg Zofran. The antibiotic was administered within 1 hour of the procedure ANESTHESIA/SEDATION: Moderate (conscious) sedation was employed during this procedure. A total of Versed 2.0 mg and Fentanyl 100 mcg was administered intravenously. Moderate Sedation Time: 60 minutes. The patient's level of consciousness and vital signs were monitored continuously by radiology nursing  throughout the procedure under my direct supervision. CONTRAST:  46 cc FLUOROSCOPY TIME:  Fluoroscopy Time: 5 minutes 0 seconds COMPLICATIONS: None PROCEDURE: Informed consent was obtained from the patient following explanation of the procedure, risks, benefits and alternatives, regarding angiogram and embolization. Specifically, the chance of contrast induced nephropathy was reviewed. The patient understands, agrees and consents for the procedure. All questions were addressed. A time out was performed prior to the initiation of the procedure. Maximal barrier sterile technique utilized including caps, mask, sterile gowns, sterile gloves, large sterile drape, hand hygiene, and Betadine prep. The procedure, risks, benefits, and alternatives of IVC filter placement were explained to the patient. Specific risks discussed include bleeding, infection, contrast reaction, renal failure, IVC filter fracture, migration, iliocaval thrombus (3-4% incidence), need for further procedure, need for further surgery, pulmonary embolism, cardiopulmonary collapse, death. Questions regarding the procedure were encouraged and answered. The patient understands and consents to the procedure. Ultrasound survey of the right inguinal region was performed with images stored and sent to PACs, confirming patency of the vessel. A micropuncture needle was used access the right common femoral artery under ultrasound. With excellent arterial blood flow returned, and an .018 micro wire was passed through the needle, observed enter the abdominal aorta under fluoroscopy. The needle was removed, and  a micropuncture sheath was placed over the wire. The inner dilator and wire were removed, and an 035 Bentson wire was advanced under fluoroscopy into the abdominal aorta. The sheath was removed and a standard 5 [place] vascular sheath was placed. The dilator was removed and the sheath was flushed. Mickelson catheter was advanced on the Bentson wire. Catheter  recur for was formed in the thoracic aorta. Catheter was withdrawn to the abdominal aorta and the wire was removed. Catheter was used to select the celiac artery. Angiogram was performed. High-flow catheter and a 14 fathom wire were advanced into the left gastric artery. Wire and the catheter were advanced into the replaced left hepatic artery. Angiogram was performed. Catheter was advanced to the division of segment 2 and segment 3. Angiogram was performed to confirm location. Embolization was then performed with a single vial of 500-700 micron embosphere to stasis. Repeat angiogram was performed. Microcatheter and the catheter were then removed. Ultrasound survey was performed of the common femoral vein with images stored and sent to PACs. Local anesthesia was provided with 1% Lidocaine. A single wall needle was used access the right common femoral vein under ultrasound. With excellent blood flow returned, a Bentson wire was passed through the needle, observed to enter the IVC under fluoroscopy. The needle was removed, and 10 [place] dilator was passed. The delivery sheath for a retrievable Bard Denali filter was passed over the Bentson wire into the IVC. The wire was removed and IVC cavagram performed. Dilator was removed, and the IVC filter was then delivered, positioned below the lowest renal vein. Repeat cavagram was not performed. The delivery sheath was removed. Manual pressure was used for hemostasis. Exoseal was deployed at this point at the common femoral artery access point. Patient tolerated the procedure well and remained hemodynamically stable throughout. No complications were encountered and no significant blood loss was encounter. FINDINGS: Ultrasound demonstrates patent common femoral vein and patent common femoral artery on the right. Angiogram demonstrates the expected arrangement of the celiac artery, with patent common hepatic artery, splenic artery, and left gastric artery. There is a replaced  left hepatic artery from the left gastric artery. 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 A sentinel clot was identified on the prior CT. Embolization was performed with 500-700 micron embospheres to stasis. After embolization was performed, retrievable IVC filter was placed below the lowest renal vein. This was deployed at the L1-L2 level. IMPRESSION: Status post ultrasound guided access right common femoral artery for hepatic angiogram and bland embolization of hemorrhagic left HCC to stasis, with Exoseal deployed for hemostasis at the common femoral artery puncture site. Status post ultrasound guided access right common femoral vein for deployment of retrievable IVC filter. Signed, Dulcy Fanny. Dellia Nims, RPVI [facility] Inter[facility] Radiology Specialists [facility] Radiology Electronically Signed   By: Corrie Mckusick D.O.   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 Despite acute renal dysfunction, angiogram is indicated for life-saving embolization. 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 EXAM: ULTRASOUND-GUIDED ACCESS RIGHT COMMON FEMORAL ARTERY ULTRASOUND GUIDED ACCESS RIGHT COMMON FEMORAL VEIN MESENTERIC ANGIOGRAM EMBOLIZATION OF HEMORRHAGIC LEFT HEPATIC [place] RETRIEVABLE IVC FILTER PLACEMENT. EXOSEAL DEPLOYED FOR HEMOSTASIS MEDICATIONS: 4 mg Zofran. The antibiotic was administered within 1 hour  of the procedure ANESTHESIA/SEDATION: Moderate (conscious) sedation was employed during this procedure. A total of Versed 2.0 mg and Fentanyl 100 mcg was administered intravenously. Moderate Sedation Time: 60 minutes. The patient's level of consciousness and vital signs were monitored continuously by  radiology nursing throughout the procedure under my direct supervision. CONTRAST:  46 cc FLUOROSCOPY TIME:  Fluoroscopy Time: 5 minutes 0 seconds COMPLICATIONS: None PROCEDURE: Informed consent was obtained from the patient following explanation of the procedure, risks, benefits and alternatives, regarding angiogram and embolization. Specifically, the chance of contrast induced nephropathy was reviewed. The patient understands, agrees and consents for the procedure. All questions were addressed. A time out was performed prior to the initiation of the procedure. Maximal barrier sterile technique utilized including caps, mask, sterile gowns, sterile gloves, large sterile drape, hand hygiene, and Betadine prep. The procedure, risks, benefits, and alternatives of IVC filter placement were explained to the patient. Specific risks discussed include bleeding, infection, contrast reaction, renal failure, IVC filter fracture, migration, iliocaval thrombus (3-4% incidence), need for further procedure, need for further surgery, pulmonary embolism, cardiopulmonary collapse, death. Questions regarding the procedure were encouraged and answered. The patient understands and consents to the procedure. Ultrasound survey of the right inguinal region was performed with images stored and sent to PACs, confirming patency of the vessel. A micropuncture needle was used access the right common femoral artery under ultrasound. With excellent arterial blood flow returned, and an .018 micro wire was passed through the needle, observed enter the abdominal aorta under fluoroscopy. The needle was removed, and a micropuncture sheath was placed over the wire. The inner dilator and wire were removed, and an 035 Bentson wire was advanced under fluoroscopy into the abdominal aorta. The sheath was removed and a standard 5 [place] vascular sheath was placed. The dilator was removed and the sheath was flushed. Mickelson catheter was advanced on the  Bentson wire. Catheter recur for was formed in the thoracic aorta. Catheter was withdrawn to the abdominal aorta and the wire was removed. Catheter was used to select the celiac artery. Angiogram was performed. High-flow catheter and a 14 fathom wire were advanced into the left gastric artery. Wire and the catheter were advanced into the replaced left hepatic artery. Angiogram was performed. Catheter was advanced to the division of segment 2 and segment 3. Angiogram was performed to confirm location. Embolization was then performed with a single vial of 500-700 micron embosphere to stasis. Repeat angiogram was performed. Microcatheter and the catheter were then removed. Ultrasound survey was performed of the common femoral vein with images stored and sent to PACs. Local anesthesia was provided with 1% Lidocaine. A single wall needle was used access the right common femoral vein under ultrasound. With excellent blood flow returned, a Bentson wire was passed through the needle, observed to enter the IVC under fluoroscopy. The needle was removed, and 10 [place] dilator was passed. The delivery sheath for a retrievable Bard Denali filter was passed over the Bentson wire into the IVC. The wire was removed and IVC cavagram performed. Dilator was removed, and the IVC filter was then delivered, positioned below the lowest renal vein. Repeat cavagram was not performed. The delivery sheath was removed. Manual pressure was used for hemostasis. Exoseal was deployed at this point at the common femoral artery access point. Patient tolerated the procedure well and remained hemodynamically stable throughout. No complications were encountered and no significant blood loss was encounter. FINDINGS: Ultrasound demonstrates patent common femoral vein and patent common femoral artery  on the right. Angiogram demonstrates the expected arrangement of the celiac artery, with patent common hepatic artery, splenic artery, and left gastric  artery. There is a replaced left hepatic artery from the left gastric artery. 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 A sentinel clot was identified on the prior CT. Embolization was performed with 500-700 micron embospheres to stasis. After embolization was performed, retrievable IVC filter was placed below the lowest renal vein. This was deployed at the L1-L2 level. IMPRESSION: Status post ultrasound guided access right common femoral artery for hepatic angiogram and bland embolization of hemorrhagic left HCC to stasis, with Exoseal deployed for hemostasis at the common femoral artery puncture site. Status post ultrasound guided access right common femoral vein for deployment of retrievable IVC filter. Signed, Dulcy Fanny. Dellia Nims, RPVI Vascular and Interventional Radiology Specialists [facility] Radiology Electronically Signed   By: Corrie Mckusick D.O.   On: 07/26/2020 17:29   DG Chest Port 1 View  Result Date: 07/25/2020 CLINICAL DATA:  Abdominal hemorrhage EXAM: PORTABLE CHEST 1 VIEW COMPARISON:  Abdominal CT from yesterday. FINDINGS: Streaky density at the bases attributed atelectasis superimposed on chronic interstitial coarsening. There is no edema, consolidation, effusion, or pneumothorax. Normal heart size and mediastinal contours. IMPRESSION: Atelectasis at the bases as seen on preceding abdominal CT. Electronically Signed   By: Monte Fantasia M.D.   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 Despite acute renal dysfunction, angiogram is indicated for life-saving embolization. 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 EXAM: ULTRASOUND-GUIDED ACCESS RIGHT  COMMON FEMORAL ARTERY ULTRASOUND GUIDED ACCESS RIGHT COMMON FEMORAL VEIN MESENTERIC ANGIOGRAM EMBOLIZATION OF HEMORRHAGIC LEFT HEPATIC [place] RETRIEVABLE IVC FILTER PLACEMENT. EXOSEAL DEPLOYED FOR HEMOSTASIS MEDICATIONS: 4 mg Zofran. The antibiotic was administered within 1 hour of the procedure ANESTHESIA/SEDATION: Moderate (conscious) sedation was employed during this procedure. A total of Versed 2.0 mg and Fentanyl 100 mcg was administered intravenously. Moderate Sedation Time: 60 minutes. The patient's level of consciousness and vital signs were monitored continuously by radiology nursing throughout the procedure under my direct supervision. CONTRAST:  46 cc FLUOROSCOPY TIME:  Fluoroscopy Time: 5 minutes 0 seconds COMPLICATIONS: None PROCEDURE: Informed consent was obtained from the patient following explanation of the procedure, risks, benefits and alternatives, regarding angiogram and embolization. Specifically, the chance of contrast induced nephropathy was reviewed. The patient understands, agrees and consents for the procedure. All questions were addressed. A time out was performed prior to the initiation of the procedure. Maximal barrier sterile technique utilized including caps, mask, sterile gowns, sterile gloves, large sterile drape, hand hygiene, and Betadine prep. The procedure, risks, benefits, and alternatives of IVC filter placement were explained to the patient. Specific risks discussed include bleeding, infection, contrast reaction, renal failure, IVC filter fracture, migration, iliocaval thrombus (3-4% incidence), need for further procedure, need for further surgery, pulmonary embolism, cardiopulmonary collapse, death. Questions regarding the procedure were encouraged and answered. The patient understands and consents to the procedure. Ultrasound survey of the right inguinal region was performed with images stored and sent to PACs, confirming patency of the vessel. A micropuncture needle was used  access the right common femoral artery under ultrasound. With excellent arterial blood flow returned, and an .018 micro  wire was passed through the needle, observed enter the abdominal aorta under fluoroscopy. The needle was removed, and a micropuncture sheath was placed over the wire. The inner dilator and wire were removed, and an 035 Bentson wire was advanced under fluoroscopy into the abdominal aorta. The sheath was removed and a standard 5 [place] vascular sheath was placed. The dilator was removed and the sheath was flushed. Mickelson catheter was advanced on the Bentson wire. Catheter recur for was formed in the thoracic aorta. Catheter was withdrawn to the abdominal aorta and the wire was removed. Catheter was used to select the celiac artery. Angiogram was performed. High-flow catheter and a 14 fathom wire were advanced into the left gastric artery. Wire and the catheter were advanced into the replaced left hepatic artery. Angiogram was performed. Catheter was advanced to the division of segment 2 and segment 3. Angiogram was performed to confirm location. Embolization was then performed with a single vial of 500-700 micron embosphere to stasis. Repeat angiogram was performed. Microcatheter and the catheter were then removed. Ultrasound survey was performed of the common femoral vein with images stored and sent to PACs. Local anesthesia was provided with 1% Lidocaine. A single wall needle was used access the right common femoral vein under ultrasound. With excellent blood flow returned, a Bentson wire was passed through the needle, observed to enter the IVC under fluoroscopy. The needle was removed, and 10 [place] dilator was passed. The delivery sheath for a retrievable Bard Denali filter was passed over the Bentson wire into the IVC. The wire was removed and IVC cavagram performed. Dilator was removed, and the IVC filter was then delivered, positioned below the lowest renal vein. Repeat cavagram was not  performed. The delivery sheath was removed. Manual pressure was used for hemostasis. Exoseal was deployed at this point at the common femoral artery access point. Patient tolerated the procedure well and remained hemodynamically stable throughout. No complications were encountered and no significant blood loss was encounter. FINDINGS: Ultrasound demonstrates patent common femoral vein and patent common femoral artery on the right. Angiogram demonstrates the expected arrangement of the celiac artery, with patent common hepatic artery, splenic artery, and left gastric artery. There is a replaced left hepatic artery from the left gastric artery. 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 A sentinel clot was identified on the prior CT. Embolization was performed with 500-700 micron embospheres to stasis. After embolization was performed, retrievable IVC filter was placed below the lowest renal vein. This was deployed at the L1-L2 level. IMPRESSION: Status post ultrasound guided access right common femoral artery for hepatic angiogram and bland embolization of hemorrhagic left HCC to stasis, with Exoseal deployed for hemostasis at the common femoral artery puncture site. Status post ultrasound guided access right common femoral vein for deployment of retrievable IVC filter. Signed, Yvone Neu. Reyne Dumas, RPVI [facility] Radiology Electronically Signed   By: Gilmer Mor D.O.   On: 07/26/2020 17:29   VAS [place] LOWER EXTREMITY VENOUS (DVT)  Result Date: 07/25/2020  Lower Venous DVTStudy Indications: Pulmonary embolism.  Risk Factors: Confirmed PE. Comparison Study: No prior studies. Performing Technologist: Chanda Busing RVT  Examination Guidelines: A complete evaluation includes B-mode imaging, spectral Doppler, color Doppler, and power Doppler as needed of all  accessible portions of each vessel. Bilateral testing is considered an integral part of a complete examination. Limited examinations for reoccurring indications  may be performed as noted. The reflux portion of the exam is performed with the patient in reverse Trendelenburg.  +---------+---------------+---------+-----------+----------+--------------+ RIGHT    CompressibilityPhasicitySpontaneityPropertiesThrombus Aging +---------+---------------+---------+-----------+----------+--------------+ CFV      Full           Yes      Yes                                 +---------+---------------+---------+-----------+----------+--------------+ SFJ      Full                                                        +---------+---------------+---------+-----------+----------+--------------+ FV Prox  Full                                                        +---------+---------------+---------+-----------+----------+--------------+ FV Mid   Full                                                        +---------+---------------+---------+-----------+----------+--------------+ FV DistalFull                                                        +---------+---------------+---------+-----------+----------+--------------+ PFV      Full                                                        +---------+---------------+---------+-----------+----------+--------------+ POP      Full           Yes      Yes                                 +---------+---------------+---------+-----------+----------+--------------+ PTV      Full                                                        +---------+---------------+---------+-----------+----------+--------------+ PERO     Full                                                        +---------+---------------+---------+-----------+----------+--------------+   +---------+---------------+---------+-----------+----------+--------------+  LEFT     CompressibilityPhasicitySpontaneityPropertiesThrombus Aging +---------+---------------+---------+-----------+----------+--------------+ CFV      Full  Yes      Yes                                 +---------+---------------+---------+-----------+----------+--------------+ SFJ      Full                                                        +---------+---------------+---------+-----------+----------+--------------+ FV Prox  Full                                                        +---------+---------------+---------+-----------+----------+--------------+ FV Mid   Full                                                        +---------+---------------+---------+-----------+----------+--------------+ FV DistalFull                                                        +---------+---------------+---------+-----------+----------+--------------+ PFV      Full                                                        +---------+---------------+---------+-----------+----------+--------------+ POP      Full           Yes      Yes                                 +---------+---------------+---------+-----------+----------+--------------+ PTV      Full                                                        +---------+---------------+---------+-----------+----------+--------------+ PERO     Full                                                        +---------+---------------+---------+-----------+----------+--------------+     Summary: RIGHT: - There is no evidence of deep vein thrombosis in the lower extremity.  - No cystic structure found in the popliteal fossa.  LEFT: - There is no evidence of deep vein thrombosis in the lower extremity.  - No cystic structure found in the popliteal fossa.  *See table(s) above for measurements and observations. Electronically signed by Sherald Hess MD on 07/25/2020 at 3:24:16 PM.  Final    CT Angio  Abd/Pel w/ and/or w/o  Result Date: 07/26/2020 CLINICAL DATA:  History of extensive hepatocellular carcinoma, now with concern for hepatic hemorrhage. EXAM: CTA ABDOMEN AND PELVIS WITHOUT AND WITH CONTRAST TECHNIQUE: Multidetector CT imaging of the abdomen and pelvis was performed using the standard protocol during bolus administration of intravenous contrast. Multiplanar reconstructed images and MIPs were obtained and reviewed to evaluate the vascular anatomy. CONTRAST:  65m OMNIPAQUE IOHEXOL 350 MG/ML SOLN COMPARISON:  CT abdomen and pelvis-earlier same day; 07/24/2020; 07/18/2020; chest CT - 07/19/2020; 04/24/2020 FINDINGS: VASCULAR Aorta: Moderate amount of mixed calcified and noncalcified atherosclerotic plaque within normal caliber abdominal aorta, not resulting in a hemodynamically significant stenosis. No abdominal aortic dissection or periaortic stranding. Celiac: There is a minimal amount of eccentric calcified and noncalcified atherosclerotic plaque involving the origin the celiac artery, not definitely resulting in hemodynamically significant stenosis. Redemonstrated non conventional celiac arterial anatomy including an accessory though dominant left hepatic artery arising from the left gastric artery. Additionally, there is a separate origin of the GDA which arises directly from the celiac artery. SMA: Widely patent without hemodynamically significant narrowing. Renals: Solitary bilaterally; the bilateral renal arteries are widely patent without a hemodynamically significant narrowing. No vessel irregularity to suggest FMD. IMA: Remains patent. Inflow: Moderate amount of mixed calcified and noncalcified atherosclerotic plaque involves the bilateral common and external iliac arteries, not definitely resulting in hemodynamically significant stenosis. The bilateral internal iliac arteries are patent and of normal caliber. Proximal Outflow: The bilateral common and imaged portions of the bilateral  superficial and deep femoral arteries appear patent and without hemodynamically significant narrowing, though note, evaluation degraded secondary to patient motion. Veins: The IVC and pelvic venous systems appear widely patent. Review of the MIP images confirms the above findings. _________________________________________________________ NON-VASCULAR Lower chest: Limited visualization of the lower thorax demonstrates minimal bibasilar subsegmental atelectasis, right greater than left. Note is made of a near occlusive filling defect within the image right lower lobe pulmonary artery (image 10, series 4), compatible with pulmonary embolism, unchanged compared to the dedicated chest CT performed 07/19/2020. Hepatobiliary: Redemonstrated extensive multifocal hepatocellular carcinoma including near complete involvement of the posterior segment of the right lobe of the liver with infiltrative tumor. Active extravasation is seen about the caudal aspect of the lateral segment of the left lobe of the liver with subsequent pooling of extravasated contrast on acquired delayed phase images (image 79, series 4; image 79, series 6; image 82, series 7). The area of active extravasation appears to be predominantly supplied via the accessory (though dominant) left hepatic artery which arises from the left gastric artery. Portal vein remains patent. Normal appearance of the gallbladder. No radiopaque gallstones. No intra or extrahepatic biliary ductal dilatation. Redemonstrated hematoma within the ventral aspect of the upper abdominal omentum with dominant hematoma measuring approximately 10.1 x 4.5 cm (image 121, series 4), with associated moderate volume hemoperitoneum and ascites. Pancreas: Normal appearance of the pancreas. Spleen: Normal appearance of the spleen. No evidence of splenomegaly. Adrenals/Urinary Tract: There is symmetric enhancement of the bilateral kidneys. No definite renal stones on this postcontrast examination.  No discrete renal lesions. No urinary obstruction or perinephric stranding. Normal appearance the bilateral adrenal glands. Normal appearance of the urinary bladder given degree distention. Stomach/Bowel: No evidence of enteric obstruction. No pneumoperitoneum, pneumatosis or portal venous gas. Lymphatic: Scattered porta hepatis lymph nodes are not enlarged by size criteria. No bulky retroperitoneal, mesenteric, pelvic or inguinal lymphadenopathy. Reproductive: Normal appearance of the prostate  gland. Other: Mild diffuse body wall anasarca. Musculoskeletal: Redemonstrated osseous metastatic disease including ill-defined lytic lesion involving the posterior elements of the right-side of the T9 vertebral body (image 34, series 4), an approximately 2.6 x 2.6 cm lytic lesion involving the T12 vertebral body (image 80, series 4), an at least 5.3 x 4.3 cm lytic lesion involving the right-side of L4 vertebral body and an approximately 3.6 x 3.5 cm lytic lesion involving the left side of the sacral ala (image 186, series). IMPRESSION: VASCULAR 1. The examination is positive for active extravasation about the caudal aspect of the lateral segment of the left lobe of the liver secondary to bleeding hepatocellular carcinoma with dominant arterial supply via an accessory (though dominant) left hepatic artery arising from the left gastric artery. 2. Redemonstrated hematoma within the ventral aspect of the upper abdominal omentum with associated moderate volume hemoperitoneum and ascites. 3. 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 NON-VASCULAR 1. Similar appearance of known multifocal hepatocellular carcinoma. The portal vein remains patent. 2. Redemonstrated extensive osseous metastatic disease as above. Above findings reviewed with Dr. Earleen Newport at the time of examination completion. Electronically Signed   By: Sandi Mariscal M.D.   On: 07/26/2020 15:38    Lab Data:  CBC: Recent Labs  Lab 07/24/20 0709 07/24/20 0854 07/24/20 2100 07/24/20 2100 07/25/20 0207 07/25/20 0207 07/25/20 1355 07/25/20 1355 07/25/20 1926 07/26/20 0257 07/26/20 1107 07/27/20 0256 07/27/20 0734  WBC 9.5   < > 7.2  --  6.9  --  8.0  --   --  7.7  --  7.2  --   NEUTROABS 6.9  --   --   --   --   --   --   --   --   --   --   --   --   HGB 8.2*   < > 8.8*   < > 9.0*   < > 7.7*   < > 7.7* 6.5* 8.0* 6.8* 6.8*  HCT 26.9*   < > 27.6*   < > 28.8*   < > 24.4*   < > 24.1* 20.5* 24.5* 20.7* 21.5*  MCV 96.8   < > 91.7  --  93.2  --  93.1  --   --  94.5  --  90.0  --   PLT 260   < > 179  --  175  --  207  --   --  210  --  209  --    < > = values in this interval not displayed.   Basic Metabolic Panel: Recent Labs  Lab 07/24/20 0709 07/25/20 0207 07/26/20 0257 07/27/20 0256  NA 140 137 138 137  K 4.5 4.2 4.9 4.3  CL 103 104 105 107  CO2 21* 21* 21* 20*  GLUCOSE 178* 82 108* 100*  BUN 24* 24* 32* 32*  CREATININE 3.03* 1.75* 2.21* 1.75*  CALCIUM 9.9 9.0 8.8* 8.5*  MG  --  1.5* 2.6*  --   PHOS  --  3.4  --   --    GFR: Estimated Creatinine Clearance: 41.4 mL/min (A) (by C-G formula based on SCr of 1.75 mg/dL (H)). Liver Function Tests: Recent Labs  Lab 07/24/20 0709 07/26/20 0257 07/27/20 0734  AST 204* 252* 343*  ALT 79* 96* 143*  ALKPHOS 282* 230* 244*  BILITOT 2.1* 2.7* 2.1*  PROT 8.0 6.6 6.6  ALBUMIN  3.3* 2.7* 2.8*   Recent Labs  Lab 07/24/20 0709  LIPASE 39   No results for input(s): AMMONIA in the last 168 hours. Coagulation Profile: Recent Labs  Lab 07/24/20 0810  INR 1.5*   Cardiac Enzymes: No results for input(s): CKTOTAL, CKMB, CKMBINDEX, TROPONINI in the last 168 hours. BNP (last 3 results) No results for input(s): PROBNP in the last 8760 hours. HbA1C: No results for input(s): HGBA1C in the last 72 hours. CBG: Recent Labs  Lab 07/25/20 1726  GLUCAP 108*   Lipid Profile: No  results for input(s): CHOL, HDL, LDLCALC, TRIG, CHOLHDL, LDLDIRECT in the last 72 hours. Thyroid Function Tests: No results for input(s): TSH, T4TOTAL, FREET4, T3FREE, THYROIDAB in the last 72 hours. Anemia Panel: No results for input(s): VITAMINB12, FOLATE, FERRITIN, TIBC, IRON, RETICCTPCT in the last 72 hours. Urine analysis:    Component Value Date/Time   COLORURINE YELLOW (A) 05/25/2018 1119   APPEARANCEUR CLEAR (A) 05/25/2018 1119   LABSPEC 1.016 05/25/2018 1119   PHURINE 5.0 05/25/2018 1119   GLUCOSEU NEGATIVE 05/25/2018 1119   HGBUR NEGATIVE 05/25/2018 1119   BILIRUBINUR NEGATIVE 05/25/2018 1119   KETONESUR NEGATIVE 05/25/2018 1119   PROTEINUR NEGATIVE 05/25/2018 1119   NITRITE NEGATIVE 05/25/2018 1119   LEUKOCYTESUR NEGATIVE 05/25/2018 1119     Falisa Lamora M.D. [facility] 07/27/2020, 1:37 PM   Call night coverage person covering after 7pm

## 2020-07-27 NOTE — Progress Notes (Signed)
CRITICAL VALUE ALERT  Critical Value:  HGB 6.8   Date & Time Notied:  07/27/20 0344  Provider Notified: [facility] : Buena Irish  Orders Received/Actions taken: YES

## 2020-07-28 ENCOUNTER — Inpatient Hospital Stay (HOSPITAL_COMMUNITY): Payer: Medicaid Other

## 2020-07-28 LAB — HEMOGLOBIN AND HEMATOCRIT, BLOOD
HCT: 28 % — ABNORMAL LOW (ref 39.0–52.0)
HCT: 27.8 % — ABNORMAL LOW (ref 39.0–52.0)
Hemoglobin: 9.2 g/dL — ABNORMAL LOW (ref 13.0–17.0)
Hemoglobin: 8.9 g/dL — ABNORMAL LOW (ref 13.0–17.0)

## 2020-07-28 LAB — CBC WITH DIFFERENTIAL/PLATELET
Abs Immature Granulocytes: 0.05 10*3/uL (ref 0.00–0.07)
Basophils Absolute: 0.1 10*3/uL (ref 0.0–0.1)
Basophils Relative: 1 %
Eosinophils Absolute: 0.1 10*3/uL (ref 0.0–0.5)
Eosinophils Relative: 2 %
HCT: 28 % — ABNORMAL LOW (ref 39.0–52.0)
Hemoglobin: 9.2 g/dL — ABNORMAL LOW (ref 13.0–17.0)
Immature Granulocytes: 1 %
Lymphocytes Relative: 12 %
Lymphs Abs: 0.9 10*3/uL (ref 0.7–4.0)
MCH: 29.8 pg (ref 26.0–34.0)
MCHC: 32.9 g/dL (ref 30.0–36.0)
MCV: 90.6 fL (ref 80.0–100.0)
Monocytes Absolute: 0.7 10*3/uL (ref 0.1–1.0)
Monocytes Relative: 10 %
Neutro Abs: 5.2 10*3/uL (ref 1.7–7.7)
Neutrophils Relative %: 74 %
Platelets: 201 10*3/uL (ref 150–400)
RBC: 3.09 MIL/uL — ABNORMAL LOW (ref 4.22–5.81)
RDW: 18.8 % — ABNORMAL HIGH (ref 11.5–15.5)
WBC: 7 10*3/uL (ref 4.0–10.5)
nRBC: 0.3 % — ABNORMAL HIGH (ref 0.0–0.2)

## 2020-07-28 LAB — BPAM RBC
Blood Product Expiration Date: 202109212359
Blood Product Expiration Date: 202109212359
Blood Product Expiration Date: 202109242359
Blood Product Expiration Date: 202109242359
Blood Product Expiration Date: 202109262359
ISSUE DATE / TIME: 202108250943
ISSUE DATE / TIME: 202108250943
ISSUE DATE / TIME: 202108270522
ISSUE DATE / TIME: 202108280946
ISSUE DATE / TIME: 202108281441
Unit Type and Rh: 5100
Unit Type and Rh: 5100
Unit Type and Rh: 5100
Unit Type and Rh: 9500
Unit Type and Rh: 9500

## 2020-07-28 LAB — COMPREHENSIVE METABOLIC PANEL
ALT: 166 U/L — ABNORMAL HIGH (ref 0–44)
AST: 367 U/L — ABNORMAL HIGH (ref 15–41)
Albumin: 2.7 g/dL — ABNORMAL LOW (ref 3.5–5.0)
Alkaline Phosphatase: 269 U/L — ABNORMAL HIGH (ref 38–126)
Anion gap: 10 (ref 5–15)
BUN: 22 mg/dL (ref 8–23)
CO2: 20 mmol/L — ABNORMAL LOW (ref 22–32)
Calcium: 8.6 mg/dL — ABNORMAL LOW (ref 8.9–10.3)
Chloride: 108 mmol/L (ref 98–111)
Creatinine, Ser: 1.24 mg/dL (ref 0.61–1.24)
GFR calc Af Amer: >60 mL/min (ref >60)
GFR calc non Af Amer: >60 mL/min (ref >60)
Glucose, Bld: 103 mg/dL — ABNORMAL HIGH (ref 70–99)
Potassium: 4 mmol/L (ref 3.5–5.1)
Sodium: 138 mmol/L (ref 135–145)
Total Bilirubin: 2.2 mg/dL — ABNORMAL HIGH (ref 0.3–1.2)
Total Protein: 6.5 g/dL (ref 6.5–8.1)

## 2020-07-28 LAB — TYPE AND SCREEN
ABO/RH(D): O POS
Antibody Screen: NEGATIVE
Unit division: 0
Unit division: 0
Unit division: 0
Unit division: 0
Unit division: 0

## 2020-07-28 LAB — BRAIN NATRIURETIC PEPTIDE: B Natriuretic Peptide: 433.4 pg/mL — ABNORMAL HIGH (ref 0.0–100.0)

## 2020-07-28 MED ORDER — SODIUM CHLORIDE 0.9 % IV BOLUS: 500.0000 mL | Freq: Once | INTRAVENOUS | Status: DC

## 2020-07-28 MED ORDER — FUROSEMIDE 10 MG/ML IJ SOLN
20.0000 mg | Freq: Two times a day (BID) | INTRAMUSCULAR | Status: AC
  Administered 2020-07-28 – 2020-07-29 (×2): 20 mg via INTRAVENOUS
  Filled 2020-07-28 (×2): qty 2

## 2020-07-28 NOTE — Progress Notes (Signed)
[facility]                                                                              Patient Demographics  Vernon [name], is a 59 y.o. male, DOB - [birth year], OVF:[id number]  Admit date - 07/24/2020   Admitting Physician Brand Males, MD  Outpatient Primary MD for the patient is Lorelee Market, MD  Outpatient specialists:   LOS - 4  days   Medical records reviewed and are as summarized below:    No chief complaint on file.      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  On 8/20, patient was seen in ED for worsening shortness of breath, found to have acute PE, Eliquis was increased to 10 mg twice daily. In ED, creatinine 3.03, anion gap 16, alk phos 282, lactic acid 3, AST 204, hemoglobin 6.5, platelets 241 CT abdomen pelvis showed interval development of high density fluid in the pelvis consistent with hemorrhage complex hypodense abnormality anteriorly in the central portion of the abdomen consistent with intraperitoneal hematoma or hemorrhage.  Multiple lesions throughout the liver consistent with metastatic disease, large lytic destructive lesion in left sacrum L1 L4 vertebral bodies consistent with metastatic disease. Patient was hypotensive in ED with SBP of 83, was transfused 2 units packed RBC, BP improved, IR was consulted for possible embolization.  Patient was admitted by CCM to ICU.  8/26, had vasovagal episode, stat H&H stable 8/27, hemoglobin down to 6.5, CT abdomen pelvis showed increase in moderate volume hemoperitoneum.  Status post mesenteric angiogram, embolization of the left hepatic arteries, retrievable IVC filter placement 8/28 doing well, LFTs trending up, creatinine stable 1.7, improving from 2.2 on 8/27, hemoglobin 6.8  Assessment & Plan    Active Problems:    Abdominal pain with Intraperitoneal hemorrhage in the setting of anticoagulation, hypotensive, hemorrhagic shock -Patient was on Eliquis, recently increased on 8/24 new PE presented with abdominal pain and found to have large intraperitoneal hemorrhage -Patient was transfused 2 units packed RBCs, anticoagulation held, admitted by CCM and IR was consulted.  On 8/26, -On 8/27, hemoglobin down to 6.5, Stat CT abd pelvis showed increase in hemoperitoneum throughout the abdomen and pelvis, favored bleeding hepatic tumor as a source of hemorrhage -IR consulted, status post mesenteric angiogram, embolization of the left hepatic arteries, retrievable IVC filter placement.  IR following -LFTs trending up, creatinine remains stable.  H&H stable 8.9   History of recent PE, coagulopathy on Eliquis -Eliquis now held, status post Kcentra reversal in the ED -Received 2units packed RBCs with FFP,  1 unit packed RBCs on 8/27, 2 units on 8/28 - s/p retrievable IVC filter placement on 8/27  Acute blood loss anemia Hemoglobin 6.5 at the time of admission, baseline 8- 10, received 2 units pRBC's on admission, 1 unit on 8/27,  2 units on 8/28 H&H currently stable    AKI (acute kidney injury) ([place]) with lactic acidosis -Due to acute blood loss anemia  and hemorrhagic shock, baseline creatinine 1.2 -Creatinine on presentation 3.0 -Due to concern for contrast nephropathy, nephrology was also consulted.   -Creatinine continues to improve, 1.2   Acute respiratory failure with hypoxia, shortness of breath likely due to volume overload -At the time of examination, patient reports difficulty breathing, worse on lying flat, has received multiple transfusions, stat BNP 433, chest x-ray with mild pulmonary venous congestion, small effusions -I's and O's with 3.5 L positive  -Placed on Lasix 20 mg IV every 12 hours x2 doses, will reassess in a.m.  Metastatic hepatocellular carcinoma -Followed by Dr. Burr Medico, initial diagnosis  in 06/2018, metastatic, status post XRT, on chemotherapy, (lenvatinib), intermittently -Oncology consulted, seen by Dr. Burr Medico, had recently discussed switching his treatment to immunotherapy and bevacizumab, contraindicated due to PE and bleeding.  Overall prognosis poor  Chronic pain secondary to malignancy Continue pain control Palliative medicine following    Code Status: full  DVT Prophylaxis:   SCD's Family Communication: Discussed all imaging results, lab results, explained to the patient and daughter on phone today    Disposition Plan:     Status is: Inpatient  Remains inpatient appropriate because:Inpatient level of care appropriate due to severity of illness   Dispo: The patient is from: Home              Anticipated d/c is to: TBD              Anticipated d/c date is: 2 days              Patient currently is not medically stable to d/c.  Status post embolization and IVC filter placement on 8/27, H&H stable but LFTs still trending up.  Having dyspnea today.   Time Spent in minutes 35 minutes  Procedures:  CT abd pelvis   Consultants:   Admitted by [facility] Intervention radiology Oncology Interventional radiology Nephrology   Antimicrobials:   Anti-infectives (From admission, onward)   Start     Dose/Rate Route Frequency Ordered Stop   07/26/20 1700  piperacillin-tazobactam (ZOSYN) IVPB 3.375 g        3.375 g 100 mL/hr over 30 Minutes Intravenous  Once 07/26/20 1659 07/26/20 1729         Medications  Scheduled Meds: . sodium chloride   Intravenous Once  . Chlorhexidine Gluconate Cloth  6 each Topical Daily  . furosemide  20 mg Intravenous Q12H  . mouth rinse  15 mL Mouth Rinse BID  . oxyCODONE  20 mg Oral Q12H   Continuous Infusions:  PRN Meds:.docusate sodium, hydrALAZINE, ondansetron (ZOFRAN) IV, oxyCODONE, polyethylene glycol      Subjective:   Vernon [name] was seen and examined today.  Complaining of shortness of breath, states having  difficulty taking a deep breath.  No chest pain.  No fevers or chills.  No abdominal pain, nausea or vomiting.  Tolerating diet.  Objective:   Vitals:   07/27/20 1817 07/27/20 2051 07/28/20 0214 07/28/20 0531  BP: (!) 158/88 136/76 (!) 145/78 131/76  Pulse: 98 98 95 89  Resp: 18   19  Temp: 99.2 F (37.3 C) (!) 100.5 F (38.1 C) 99.4 F (37.4 C) 100 F (37.8 C)  TempSrc: Oral Oral Oral Oral  SpO2: 99% 100% 100% 98%  Weight:      Height:        Intake/Output Summary (Last 24 hours) at 07/28/2020 1247 Last data filed at 07/27/2020 1730 Gross per 24 hour  Intake 315 ml  Output --  Net 315 ml     Wt Readings from Last 3 Encounters:  07/27/20 71.7 kg  07/19/20 70.8 kg  07/19/20 70.8 kg   Physical Exam  General: Alert and oriented x 3, NAD  Cardiovascular: S1 S2 clear, RRR. No pedal edema b/l  Respiratory: Bibasilar crackles  Gastrointestinal: Soft, nontender, nondistended, NBS  Ext: no pedal edema bilaterally  Neuro: no new deficits  Musculoskeletal: No cyanosis, clubbing  Skin: No rashes  Psych: Normal affect and demeanor, alert and oriented x3     Data Reviewed:  I have personally reviewed following labs and imaging studies  Micro Results Recent Results (from the past 240 hour(s))  Blood culture (routine x 2)     Status: None (Preliminary result)   Collection Time: 07/24/20  8:10 AM   Specimen: BLOOD RIGHT HAND  Result Value Ref Range Status   Specimen Description   Final    BLOOD RIGHT HAND Performed at [facility], [address], [place], [place] [address]    Special Requests   Final    BOTTLES DRAWN AEROBIC AND ANAEROBIC Blood Culture results may not be optimal due to an inadequate volume of blood received in culture bottles Performed at [facility], [address], [place], [place] [address]    Culture   Final    NO GROWTH 3 DAYS Performed at [facility], [address] [place], [place]  [address]    Report Status PENDING  Incomplete  Blood culture (routine x 2)     Status: None (Preliminary result)   Collection Time: 07/24/20  8:15 AM   Specimen: BLOOD  Result Value Ref Range Status   Specimen Description   Final    BLOOD LEFT WRIST Performed at [facility], [address] [place], [place] [address]    Special Requests   Final    BOTTLES DRAWN AEROBIC ONLY Blood Culture adequate volume Performed at [facility], [address], [place], [place] [address]    Culture   Final    NO GROWTH 3 DAYS Performed at [facility], [address] [place], [place] [address]    Report Status PENDING  Incomplete  SARS Coronavirus 2 by RT PCR (hospital order, performed in [facility] hospital lab) Nasopharyngeal Nasopharyngeal Swab     Status: None   Collection Time: 07/24/20  8:17 AM   Specimen: Nasopharyngeal Swab  Result Value Ref Range Status   SARS Coronavirus 2 NEGATIVE NEGATIVE Final    Comment: (NOTE) SARS-CoV-2 target nucleic acids are NOT DETECTED.  The SARS-CoV-2 RNA is generally detectable in upper and lower respiratory specimens during the acute phase of infection. The lowest concentration of SARS-CoV-2 viral copies this assay can detect is 250 copies / mL. A negative result does not preclude SARS-CoV-2 infection and should not be used as the sole basis for treatment or other patient management decisions.  A negative result may occur with improper specimen collection / handling, submission of specimen other than nasopharyngeal swab, presence of viral mutation(s) within the areas targeted by this assay, and inadequate number of viral copies (<250 copies / mL). A negative result must be combined with clinical observations, patient history, and epidemiological information.  Fact Sheet for Patients:   BoilerBrush.com.cy  Fact Sheet for Healthcare  Providers: https://pope.com/  This test is not yet approved or  cleared by the [place] FDA and has been authorized for detection and/or diagnosis of SARS-CoV-2 by FDA  under an Emergency Use Authorization (EUA).  This EUA will remain in effect (meaning this test can be used) for the duration of the COVID-19 declaration under Section 564(b)(1) of the Act, 21 U.S.C. section 360bbb-3(b)(1), unless the authorization is terminated or revoked sooner.  Performed at [facility], [place] [address] [place], [place] [address]   MRSA PCR Screening     Status: None   Collection Time: 07/24/20  5:19 PM   Specimen: Nasopharyngeal  Result Value Ref Range Status   MRSA by PCR NEGATIVE NEGATIVE Final    Comment:        The GeneXpert MRSA Assay (FDA approved for NASAL specimens only), is one component of a comprehensive MRSA colonization surveillance program. It is not intended to diagnose MRSA infection nor to guide or monitor treatment for MRSA infections. Performed at [facility], [place] [address] [place], [place] [address]     Radiology Reports CT ABDOMEN PELVIS WO CONTRAST  Addendum Date: 07/26/2020   ADDENDUM REPORT: 07/26/2020 12:31 ADDENDUM: Critical Value/emergent results were called by telephone at the time of interpretation on 07/26/2020 at 1226 hours to Dr. Nira Conn Thalia Turkington , who verbally acknowledged these results. Electronically Signed   By: Genevie Ann M.D.   On: 07/26/2020 12:31   Result Date: 07/26/2020 CLINICAL DATA:  59 year old male with left upper quadrant abdominal pain. Metastatic hepatocellular carcinoma. On Eliquis for pulmonary embolus, status post abdominal/retroperitoneal hemorrhage. Hemoglobin continues to trend down, query worsening hematoma. EXAM: CT ABDOMEN AND PELVIS WITHOUT CONTRAST TECHNIQUE: Multidetector CT imaging of the abdomen and pelvis was performed following the standard protocol without IV  contrast. COMPARISON:  CT Abdomen and Pelvis 07/24/2020 without contrast. CT Abdomen and Pelvis with contrast 07/18/2020. FINDINGS: Lower chest: Stable lung bases with bronchiectasis, fibrosis and scarring. No pericardial or pleural effusion. Hepatobiliary: Increased perihepatic complex fluid compatible with hemoperitoneum. Additional details are in the Other section below. Nodular liver contour related to infiltrative tumor in both the right and left hepatic lobes again noted. And there is more hyperdense hematoma located along the undersurface of the liver a especially at the left lobe and porta hepatis as seen on coronal image 30 today. Stable noncontrast gallbladder. Pancreas: Stable pancreatic atrophy. Spleen: Increased, moderate complex free fluid about the spleen compatible with hemoperitoneum. Layering hematocrit is evident on series 2, image 29. Stable small splenic size. Adrenals/Urinary Tract: Negative noncontrast appearance. Stomach/Bowel: No free air. No dilated large or small bowel. Some bowel loops are difficult to delineate from the moderate volume of hemoperitoneum, further detailed below. The stomach is decompressed. Vascular/Lymphatic: Vascular patency is not evaluated in the absence of IV contrast. Aortoiliac calcified atherosclerosis. Stable dense calcification in the deep pelvis which might be chronically calcified lymph node. No abdominal or pelvic lymphadenopathy. Reproductive: Negative. Other: Complex free fluid in the pelvis in the and abdominal peritoneal cavity has increased since 07/24/2020. See series 2, image 53 today compared to series 2, image 45 previously, series 2, image 75 today compared to series 2, image 68 previously). The volume is now moderate. Musculoskeletal: Lytic lesions of the right T9 vertebral posterior elements, T12 vertebral body, right L4 vertebral body and pedicle (with stable pathologic fracture as well as extraosseous and epidural extension - see series 2, image  57)), left sacral ala. Stable visualized osseous structures. IMPRESSION: 1. Increased and now moderate volume Hemoperitoneum throughout the abdomen and pelvis. Given hyperdense clot along the undersurface of the liver, favor bleeding hepatic tumor as the source of hemorrhage.  2. Grossly stable infiltrative liver tumor and complicated spinal metastatic disease since 07/24/2020. 3. Lung base Bronchiectasis and scarring. Aortic Atherosclerosis (ICD10-I70.0). Electronically Signed: By: Genevie Ann M.D. On: 07/26/2020 12:24   CT ABDOMEN PELVIS WO CONTRAST  Result Date: 07/24/2020 CLINICAL DATA:  Acute generalized abdominal pain and distention. History of hepatocellular carcinoma. EXAM: CT ABDOMEN AND PELVIS WITHOUT CONTRAST TECHNIQUE: Multidetector CT imaging of the abdomen and pelvis was performed following the standard protocol without IV contrast. COMPARISON:  July 18, 2020. FINDINGS: Lower chest: Mild bibasilar subsegmental atelectasis is noted. Hepatobiliary: No gallstones or biliary dilatation is noted. Multiple rounded hypoechoic lesions are noted throughout the liver consistent with metastatic disease. Pancreas: Unremarkable. No pancreatic ductal dilatation or surrounding inflammatory changes. Spleen: Normal in size without focal abnormality. Adrenals/Urinary Tract: Adrenal glands are unremarkable. Kidneys are normal, without renal calculi, focal lesion, or hydronephrosis. Bladder is unremarkable. Stomach/Bowel: The stomach is unremarkable. There is no evidence of bowel obstruction or inflammation the appendix appears normal. Vascular/Lymphatic: Aortic atherosclerosis. No enlarged abdominal or pelvic lymph nodes. Reproductive: Prostate is unremarkable. Other: There is the interval development of high density fluid in the pelvis most consistent with hemorrhage. There also appears to be complex hyperdense abnormality seen anteriorly in the central portion of the abdomen, also consistent with intraperitoneal  hematoma or hemorrhage. No hernia is noted. Hemorrhage is also noted inferior to the right hepatic lobe. Musculoskeletal: Large lytic destructive lesions are noted in the left sacrum, L1 and L4 vertebral bodies. The L4 lesion appears to extend into the right paraspinal soft tissues. IMPRESSION: 1. Interval development of high density fluid in the pelvis most consistent with hemorrhage. There also appears to be complex hyperdense abnormality seen anteriorly in the central portion of the abdomen, also consistent with intraperitoneal hematoma or hemorrhage. Hemorrhage is also noted inferior to the right hepatic lobe. Critical Value/emergent results were called by telephone at the time of interpretation on 07/24/2020 at 8:46 am to provider ADAM CURATOLO , who verbally acknowledged these results. 2. Multiple rounded hypoechoic lesions are noted throughout the liver consistent with metastatic disease. 3. Large lytic destructive lesions are noted in the left sacrum, L1 and L4 vertebral bodies consistent with metastatic disease. The L4 lesion appears to extend into the right paraspinal soft tissues. 4. Aortic atherosclerosis. Aortic Atherosclerosis (ICD10-I70.0). Electronically Signed   By: Marijo Conception M.D.   On: 07/24/2020 08:46   DG Chest 2 View  Result Date: 07/19/2020 CLINICAL DATA:  59 year old male with shortness of breath. EXAM: CHEST - 2 VIEW COMPARISON:  Chest radiograph dated 07/19/2020. FINDINGS: No focal consolidation, pleural effusion, or pneumothorax. The cardiac silhouette is within limits. Atherosclerotic calcification of the aorta. No acute osseous pathology. Degenerative changes of the spine and bilateral AC joints. IMPRESSION: No active cardiopulmonary disease. Electronically Signed   By: Anner Crete M.D.   On: 07/19/2020 21:59   DG Chest 2 View  Result Date: 07/19/2020 CLINICAL DATA:  Shortness of breath EXAM: CHEST - 2 VIEW COMPARISON:  Chest CT 04/24/2020 FINDINGS: Normal heart size and  mediastinal contours. No acute infiltrate or edema. No effusion or pneumothorax. Known osseous metastatic disease by CT. No acute osseous findings. IMPRESSION: No acute finding. Electronically Signed   By: Monte Fantasia M.D.   On: 07/19/2020 07:25   CT Angio Chest PE W and/or Wo Contrast  Result Date: 07/19/2020 CLINICAL DATA:  Shortness of breath. Possible PE seen on CT 1 day ago. Stage IV multifocal hepatocellular carcinoma. EXAM: CT ANGIOGRAPHY CHEST  WITH CONTRAST TECHNIQUE: Multidetector CT imaging of the chest was performed using the standard protocol during bolus administration of intravenous contrast. Multiplanar CT image reconstructions and MIPs were obtained to evaluate the vascular anatomy. CONTRAST:  70mL OMNIPAQUE IOHEXOL 350 MG/ML SOLN COMPARISON:  CT abdomen-pelvis 07/18/2020.  CT chest 04/24/2020 FINDINGS: Cardiovascular: Satisfactory opacification of the pulmonary arteries to the segmental level. Chronic nonocclusive filling defect within the lobar and segmental branches of the right lower lobe pulmonary artery (series 4, images 50-52). The thrombus burden has decreased compared to prior CT 04/24/2020. No additional filling defects are identified. No central PE. Normal RV to LV ratio without evidence of right heart strain. Thoracic aorta is nonaneurysmal. Atherosclerotic calcification of the aortic arch. Normal heart size. No pericardial effusion. Mediastinum/Nodes: No enlarged mediastinal, hilar, or axillary lymph nodes. Thyroid gland, trachea, and esophagus demonstrate no significant findings. Lungs/Pleura: Similar mild-moderate emphysematous lung changes. No focal airspace consolidation. No pulmonary nodules or masses. No pleural effusion or pneumothorax. Upper Abdomen: Numerous confluent heterogeneous enhancing liver masses, as detailed on CT abdomen pelvis 07/18/2020. No new or acute findings within the visualized portion of the upper abdomen. Musculoskeletal: Lytic metastatic lesion  within the left portion of the T12 vertebral body. Expansile lytic lesion centered within the right T9 transverse process (series 4, image 61). No definite new areas of osseous metastasis are identified. No acute fracture. Review of the MIP images confirms the above findings. IMPRESSION: 1. Chronic nonocclusive filling defect within the lobar and segmental branches of the right lower lobe pulmonary artery. The thrombus burden has decreased compared to prior CT 04/24/2020. No additional filling defects are identified. No evidence of right heart strain. 2. Lungs are clear. 3. Osseous metastatic disease as described above. 4. Numerous confluent heterogeneous enhancing liver masses, as detailed on CT abdomen pelvis 07/18/2020. Aortic Atherosclerosis (ICD10-I70.0) and Emphysema (ICD10-J43.9). Electronically Signed   By: Davina Poke D.O.   On: 07/19/2020 09:06   NM Bone Scan Whole Body  Result Date: 07/18/2020 CLINICAL DATA:  Hepatocellular carcinoma.  Known bony metastasis EXAM: NUCLEAR MEDICINE WHOLE BODY BONE SCAN TECHNIQUE: Whole body anterior and posterior images were obtained approximately 3 hours after intravenous injection of radiopharmaceutical. RADIOPHARMACEUTICALS:  19.6 mCi Technetium-38m MDP IV COMPARISON:  Bone scan 05/20/2020 FINDINGS: Physiologic distribution of radiotracer with activity in the bilateral kidneys and urinary bladder. Faint uptake is seen within the right T9 pedicle. Faint uptake is also seen within the T12 vertebral body. No appreciable radiotracer accumulation within the L4 vertebrae or left hemi sacrum at sites of known metastatic disease. Similar distribution of degenerative uptake including within the bilateral shoulders and knees. IMPRESSION: 1. Faint uptake within the T9 and T12 vertebrae at sites of known metastatic disease. 2. No appreciable focal uptake is seen within the L4 vertebrae or left hemi sacrum at sites of known osseous metastatic disease. These lesions are more  evident on CT. Electronically Signed   By: Davina Poke D.O.   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 History of palliative radiation at T9, T10 and L4. Mild going medical therapy. Restaging. EXAM: CT ABDOMEN AND PELVIS WITH CONTRAST TECHNIQUE: Multidetector CT imaging of the abdomen and pelvis was performed using the standard protocol following bolus administration of intravenous contrast. CONTRAST:  18mL OMNIPAQUE IOHEXOL 300 MG/ML  SOLN COMPARISON:  04/24/2020 CT chest, abdomen and pelvis. FINDINGS: Lower chest: Centrilobular emphysema at the lung bases.  Low signal filling defect centrally in the right lower lobe pulmonary artery branch on the uppermost slice (series 2/image 1), potentially acute pulmonary embolus. Hepatobiliary: Numerous bulky confluent heterogeneous hyperenhancing liver masses throughout the liver, increased since 04/24/2020 CT. Representative liver masses as follows: -segment 3 left liver lobe 4.6 x 4.4 cm mass (series 2/image 51), previously 4.3 x 3.7 cm -segment 2 left liver lobe 2.7 x 2.2 cm mass (series 2/image 31), previously 2.0 x 1.5 cm -segment 5 right liver 3.6 x 3.2 cm mass (series 2/image 62), previously 3.0 x 2.5 cm Normal gallbladder with no radiopaque cholelithiasis. No biliary ductal dilatation. Pancreas: Normal, with no mass or duct dilation. Spleen: Normal size. No mass. Adrenals/Urinary Tract: Normal adrenals. Normal kidneys with no hydronephrosis and no renal mass. Normal bladder. Stomach/Bowel: Normal non-distended stomach. Normal caliber small bowel with no small bowel wall thickening. Normal appendix. Normal large bowel with no diverticulosis, large bowel wall thickening or pericolonic fat stranding. Vascular/Lymphatic: Atherosclerotic nonaneurysmal abdominal aorta. Patent portal, splenic, hepatic and renal veins. No pathologically enlarged  lymph nodes in the abdomen or pelvis. Reproductive: Normal size prostate. Other: No pneumoperitoneum, ascites or focal fluid collection. Musculoskeletal: Multiple lytic bone metastases scattered in the visualized spine and sacrum, increased. Representative expansile right L4 vertebral 6.4 x 5.7 cm lesion (series 12/image 48), increased from 6.0 x 4.9 cm. Representative left upper sacral 3.4 x 3.0 cm lesion (series 12/image 63), not previously imaged. Represent of left T12 2.9 x 2.8 cm lesion (series 12/image 23), increased from 2.4 x 2.3 cm. Marked lumbar spondylosis. IMPRESSION: 1. Probable acute pulmonary embolus partially visualized at the right lung base. Further evaluation with PE protocol CT chest angiogram may be obtained as clinically warranted. 2. Widespread liver metastases, increased. 3. Multiple lytic bone metastases, increased. 4. Aortic Atherosclerosis (ICD10-I70.0) and Emphysema (ICD10-J43.9). Critical Value/emergent results were called by telephone at the time of interpretation on 07/18/2020 at 12:54 pm to provider Truitt Merle , who verbally acknowledged these results. Electronically Signed   By: Ilona Sorrel M.D.   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 Despite acute renal dysfunction, angiogram is indicated for life-saving embolization. 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 EXAM: ULTRASOUND-GUIDED ACCESS RIGHT COMMON FEMORAL ARTERY ULTRASOUND GUIDED ACCESS RIGHT COMMON FEMORAL VEIN MESENTERIC ANGIOGRAM EMBOLIZATION OF HEMORRHAGIC LEFT HEPATIC [place] RETRIEVABLE IVC FILTER PLACEMENT. EXOSEAL DEPLOYED FOR HEMOSTASIS MEDICATIONS: 4 mg Zofran. The antibiotic was administered within 1 hour of the procedure ANESTHESIA/SEDATION: Moderate (conscious) sedation was employed during this procedure. A total  of Versed 2.0 mg and Fentanyl 100 mcg was administered intravenously. Moderate Sedation Time: 60 minutes. The patient's level of consciousness and vital signs were monitored continuously by radiology nursing throughout the procedure under my direct supervision. CONTRAST:  46 cc FLUOROSCOPY TIME:  Fluoroscopy Time: 5 minutes 0 seconds COMPLICATIONS: None PROCEDURE: Informed consent was obtained from the patient following explanation of the procedure, risks, benefits and alternatives, regarding angiogram and embolization. Specifically, the chance of contrast induced nephropathy was reviewed. The patient understands, agrees and consents for the procedure. All questions were addressed. A time out was performed prior to the initiation of the procedure. Maximal barrier sterile technique utilized including caps, mask, sterile gowns, sterile gloves, large sterile drape, hand hygiene, and Betadine prep. The procedure, risks, benefits, and alternatives of IVC filter placement were explained to  the patient. Specific risks discussed include bleeding, infection, contrast reaction, renal failure, IVC filter fracture, migration, iliocaval thrombus (3-4% incidence), need for further procedure, need for further surgery, pulmonary embolism, cardiopulmonary collapse, death. Questions regarding the procedure were encouraged and answered. The patient understands and consents to the procedure. Ultrasound survey of the right inguinal region was performed with images stored and sent to PACs, confirming patency of the vessel. A micropuncture needle was used access the right common femoral artery under ultrasound. With excellent arterial blood flow returned, and an .018 micro wire was passed through the needle, observed enter the abdominal aorta under fluoroscopy. The needle was removed, and a micropuncture sheath was placed over the wire. The inner dilator and wire were removed, and an 035 Bentson wire was advanced under fluoroscopy into  the abdominal aorta. The sheath was removed and a standard 5 [place] vascular sheath was placed. The dilator was removed and the sheath was flushed. Mickelson catheter was advanced on the Bentson wire. Catheter recur for was formed in the thoracic aorta. Catheter was withdrawn to the abdominal aorta and the wire was removed. Catheter was used to select the celiac artery. Angiogram was performed. High-flow catheter and a 14 fathom wire were advanced into the left gastric artery. Wire and the catheter were advanced into the replaced left hepatic artery. Angiogram was performed. Catheter was advanced to the division of segment 2 and segment 3. Angiogram was performed to confirm location. Embolization was then performed with a single vial of 500-700 micron embosphere to stasis. Repeat angiogram was performed. Microcatheter and the catheter were then removed. Ultrasound survey was performed of the common femoral vein with images stored and sent to PACs. Local anesthesia was provided with 1% Lidocaine. A single wall needle was used access the right common femoral vein under ultrasound. With excellent blood flow returned, a Bentson wire was passed through the needle, observed to enter the IVC under fluoroscopy. The needle was removed, and 10 [place] dilator was passed. The delivery sheath for a retrievable Bard Denali filter was passed over the Bentson wire into the IVC. The wire was removed and IVC cavagram performed. Dilator was removed, and the IVC filter was then delivered, positioned below the lowest renal vein. Repeat cavagram was not performed. The delivery sheath was removed. Manual pressure was used for hemostasis. Exoseal was deployed at this point at the common femoral artery access point. Patient tolerated the procedure well and remained hemodynamically stable throughout. No complications were encountered and no significant blood loss was encounter. FINDINGS: Ultrasound demonstrates patent common femoral vein and  patent common femoral artery on the right. Angiogram demonstrates the expected arrangement of the celiac artery, with patent common hepatic artery, splenic artery, and left gastric artery. There is a replaced left hepatic artery from the left gastric artery. 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 A sentinel clot was identified on the prior CT. Embolization was performed with 500-700 micron embospheres to stasis. After embolization was performed, retrievable IVC filter was placed below the lowest renal vein. This was deployed at the L1-L2 level. IMPRESSION: Status post ultrasound guided access right common femoral artery for hepatic angiogram and bland embolization of hemorrhagic left HCC to stasis, with Exoseal deployed for hemostasis at the common femoral artery puncture site. Status post ultrasound guided access right common femoral vein for deployment of retrievable IVC filter. Signed, Dulcy Fanny. Earleen Newport DO, RPVI [facility] Radiology Electronically Signed   By: Corrie Mckusick D.O.   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 Despite acute renal dysfunction, angiogram is indicated for life-saving embolization. 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 EXAM: ULTRASOUND-GUIDED ACCESS RIGHT COMMON FEMORAL ARTERY ULTRASOUND GUIDED ACCESS RIGHT COMMON FEMORAL VEIN MESENTERIC ANGIOGRAM EMBOLIZATION OF HEMORRHAGIC LEFT HEPATIC [place] RETRIEVABLE IVC FILTER PLACEMENT. EXOSEAL DEPLOYED FOR HEMOSTASIS MEDICATIONS: 4 mg Zofran. The antibiotic was administered within 1 hour of the procedure ANESTHESIA/SEDATION: Moderate (conscious) sedation was employed  during this procedure. A total of Versed 2.0 mg and Fentanyl 100 mcg was administered intravenously. Moderate Sedation Time: 60 minutes. The patient's level of consciousness and vital signs were monitored continuously by radiology nursing throughout the procedure under my direct supervision. CONTRAST:  46 cc FLUOROSCOPY TIME:  Fluoroscopy Time: 5 minutes 0 seconds COMPLICATIONS: None PROCEDURE: Informed consent was obtained from the patient following explanation of the procedure, risks, benefits and alternatives, regarding angiogram and embolization. Specifically, the chance of contrast induced nephropathy was reviewed. The patient understands, agrees and consents for the procedure. All questions were addressed. A time out was performed prior to the initiation of the procedure. Maximal barrier sterile technique utilized including caps, mask, sterile gowns, sterile gloves, large sterile drape, hand hygiene, and Betadine prep. The procedure, risks, benefits, and alternatives of IVC filter placement were explained to the patient. Specific risks discussed include bleeding, infection, contrast reaction, renal failure, IVC filter fracture, migration, iliocaval thrombus (3-4% incidence), need for further procedure, need for further surgery, pulmonary embolism, cardiopulmonary collapse, death. Questions regarding the procedure were encouraged and answered. The patient understands and consents to the procedure. Ultrasound survey of the right inguinal region was performed with images stored and sent to PACs, confirming patency of the vessel. A micropuncture needle was used access the right common femoral artery under ultrasound. With excellent arterial blood flow returned, and an .018 micro wire was passed through the needle, observed enter the abdominal aorta under fluoroscopy. The needle was removed, and a micropuncture sheath was placed over the wire. The inner dilator and wire were removed, and an 035 Bentson wire was  advanced under fluoroscopy into the abdominal aorta. The sheath was removed and a standard 5 [place] vascular sheath was placed. The dilator was removed and the sheath was flushed. Mickelson catheter was advanced on the Bentson wire. Catheter recur for was formed in the thoracic aorta. Catheter was withdrawn to the abdominal aorta and the wire was removed. Catheter was used to select the celiac artery. Angiogram was performed. High-flow catheter and a 14 fathom wire were advanced into the left gastric artery. Wire and the catheter were advanced into the replaced left hepatic artery. Angiogram was performed. Catheter was advanced to the division of segment 2 and segment 3. Angiogram was performed to confirm location. Embolization was then performed with a single vial of 500-700 micron embosphere to stasis. Repeat angiogram was performed. Microcatheter and the catheter were then removed. Ultrasound survey was performed of the common femoral vein with images stored and sent to PACs. Local anesthesia was provided with 1% Lidocaine. A single wall needle was used access the right common femoral vein under ultrasound. With excellent blood flow returned, a Bentson wire was passed through the needle, observed to enter the IVC under fluoroscopy. The needle was  removed, and 10 [place] dilator was passed. The delivery sheath for a retrievable Bard Denali filter was passed over the Bentson wire into the IVC. The wire was removed and IVC cavagram performed. Dilator was removed, and the IVC filter was then delivered, positioned below the lowest renal vein. Repeat cavagram was not performed. The delivery sheath was removed. Manual pressure was used for hemostasis. Exoseal was deployed at this point at the common femoral artery access point. Patient tolerated the procedure well and remained hemodynamically stable throughout. No complications were encountered and no significant blood loss was encounter. FINDINGS: Ultrasound  demonstrates patent common femoral vein and patent common femoral artery on the right. Angiogram demonstrates the expected arrangement of the celiac artery, with patent common hepatic artery, splenic artery, and left gastric artery. There is a replaced left hepatic artery from the left gastric artery. 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 A sentinel clot was identified on the prior CT. Embolization was performed with 500-700 micron embospheres to stasis. After embolization was performed, retrievable IVC filter was placed below the lowest renal vein. This was deployed at the L1-L2 level. IMPRESSION: Status post ultrasound guided access right common femoral artery for hepatic angiogram and bland embolization of hemorrhagic left HCC to stasis, with Exoseal deployed for hemostasis at the common femoral artery puncture site. Status post ultrasound guided access right common femoral vein for deployment of retrievable IVC filter. Signed, Dulcy Fanny. Dellia Nims, RPVI [facility] Radiology Electronically Signed   By: Corrie Mckusick D.O.   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 Despite acute renal dysfunction, angiogram is indicated for life-saving embolization. 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 EXAM: ULTRASOUND-GUIDED ACCESS RIGHT COMMON FEMORAL ARTERY ULTRASOUND GUIDED ACCESS RIGHT COMMON FEMORAL VEIN MESENTERIC ANGIOGRAM EMBOLIZATION OF HEMORRHAGIC LEFT HEPATIC [place] RETRIEVABLE IVC FILTER PLACEMENT. EXOSEAL DEPLOYED FOR HEMOSTASIS MEDICATIONS: 4 mg Zofran. The antibiotic was administered within 1 hour of the procedure ANESTHESIA/SEDATION:  Moderate (conscious) sedation was employed during this procedure. A total of Versed 2.0 mg and Fentanyl 100 mcg was administered intravenously. Moderate Sedation Time: 60 minutes. The patient's level of consciousness and vital signs were monitored continuously by radiology nursing throughout the procedure under my direct supervision. CONTRAST:  46 cc FLUOROSCOPY TIME:  Fluoroscopy Time: 5 minutes 0 seconds COMPLICATIONS: None PROCEDURE: Informed consent was obtained from the patient following explanation of the procedure, risks, benefits and alternatives, regarding angiogram and embolization. Specifically, the chance of contrast induced nephropathy was reviewed. The patient understands, agrees and consents for the procedure. All questions were addressed. A time out was performed prior to the initiation of the procedure. Maximal barrier sterile technique utilized including caps, mask, sterile gowns, sterile gloves, large sterile drape, hand hygiene, and Betadine prep. The procedure, risks, benefits, and alternatives of IVC filter placement were explained to the patient. Specific risks discussed include bleeding, infection, contrast reaction, renal failure, IVC filter fracture, migration, iliocaval thrombus (3-4% incidence), need for further procedure, need for further surgery, pulmonary embolism, cardiopulmonary collapse, death. Questions regarding the procedure were encouraged and answered. The patient understands and consents to the procedure. Ultrasound survey of the right inguinal region was performed with images stored and sent to PACs,  confirming patency of the vessel. A micropuncture needle was used access the right common femoral artery under ultrasound. With excellent arterial blood flow returned, and an .018 micro wire was passed through the needle, observed enter the abdominal aorta under fluoroscopy. The needle was removed, and a micropuncture sheath was placed over the wire. The inner dilator and wire  were removed, and an 035 Bentson wire was advanced under fluoroscopy into the abdominal aorta. The sheath was removed and a standard 5 [place] vascular sheath was placed. The dilator was removed and the sheath was flushed. Mickelson catheter was advanced on the Bentson wire. Catheter recur for was formed in the thoracic aorta. Catheter was withdrawn to the abdominal aorta and the wire was removed. Catheter was used to select the celiac artery. Angiogram was performed. High-flow catheter and a 14 fathom wire were advanced into the left gastric artery. Wire and the catheter were advanced into the replaced left hepatic artery. Angiogram was performed. Catheter was advanced to the division of segment 2 and segment 3. Angiogram was performed to confirm location. Embolization was then performed with a single vial of 500-700 micron embosphere to stasis. Repeat angiogram was performed. Microcatheter and the catheter were then removed. Ultrasound survey was performed of the common femoral vein with images stored and sent to PACs. Local anesthesia was provided with 1% Lidocaine. A single wall needle was used access the right common femoral vein under ultrasound. With excellent blood flow returned, a Bentson wire was passed through the needle, observed to enter the IVC under fluoroscopy. The needle was removed, and 10 [place] dilator was passed. The delivery sheath for a retrievable Bard Denali filter was passed over the Bentson wire into the IVC. The wire was removed and IVC cavagram performed. Dilator was removed, and the IVC filter was then delivered, positioned below the lowest renal vein. Repeat cavagram was not performed. The delivery sheath was removed. Manual pressure was used for hemostasis. Exoseal was deployed at this point at the common femoral artery access point. Patient tolerated the procedure well and remained hemodynamically stable throughout. No complications were encountered and no significant blood loss was  encounter. FINDINGS: Ultrasound demonstrates patent common femoral vein and patent common femoral artery on the right. Angiogram demonstrates the expected arrangement of the celiac artery, with patent common hepatic artery, splenic artery, and left gastric artery. There is a replaced left hepatic artery from the left gastric artery. 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 A sentinel clot was identified on the prior CT. Embolization was performed with 500-700 micron embospheres to stasis. After embolization was performed, retrievable IVC filter was placed below the lowest renal vein. This was deployed at the L1-L2 level. IMPRESSION: Status post ultrasound guided access right common femoral artery for hepatic angiogram and bland embolization of hemorrhagic left HCC to stasis, with Exoseal deployed for hemostasis at the common femoral artery puncture site. Status post ultrasound guided access right common femoral vein for deployment of retrievable IVC filter. Signed, Dulcy Fanny. Dellia Nims, RPVI [facility]iology [facility] Radiology Electronically Signed   By: Corrie Mckusick D.O.   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 Despite acute renal dysfunction, angiogram is indicated for life-saving embolization. IVC filter is also indicated given the patient's thromboembolic  disease, and inability to anticoagulate. EXAM: ULTRASOUND-GUIDED ACCESS RIGHT COMMON FEMORAL ARTERY ULTRASOUND GUIDED ACCESS RIGHT COMMON FEMORAL VEIN MESENTERIC ANGIOGRAM EMBOLIZATION OF HEMORRHAGIC LEFT HEPATIC [place] RETRIEVABLE IVC FILTER PLACEMENT. EXOSEAL DEPLOYED FOR HEMOSTASIS MEDICATIONS: 4 mg Zofran. The antibiotic was administered within 1 hour of the procedure  ANESTHESIA/SEDATION: Moderate (conscious) sedation was employed during this procedure. A total of Versed 2.0 mg and Fentanyl 100 mcg was administered intravenously. Moderate Sedation Time: 60 minutes. The patient's level of consciousness and vital signs were monitored continuously by radiology nursing throughout the procedure under my direct supervision. CONTRAST:  46 cc FLUOROSCOPY TIME:  Fluoroscopy Time: 5 minutes 0 seconds COMPLICATIONS: None PROCEDURE: Informed consent was obtained from the patient following explanation of the procedure, risks, benefits and alternatives, regarding angiogram and embolization. Specifically, the chance of contrast induced nephropathy was reviewed. The patient understands, agrees and consents for the procedure. All questions were addressed. A time out was performed prior to the initiation of the procedure. Maximal barrier sterile technique utilized including caps, mask, sterile gowns, sterile gloves, large sterile drape, hand hygiene, and Betadine prep. The procedure, risks, benefits, and alternatives of IVC filter placement were explained to the patient. Specific risks discussed include bleeding, infection, contrast reaction, renal failure, IVC filter fracture, migration, iliocaval thrombus (3-4% incidence), need for further procedure, need for further surgery, pulmonary embolism, cardiopulmonary collapse, death. Questions regarding the procedure were encouraged and answered. The patient understands and consents to the procedure. Ultrasound survey of the right inguinal region was performed with images stored and sent to PACs, confirming patency of the vessel. A micropuncture needle was used access the right common femoral artery under ultrasound. With excellent arterial blood flow returned, and an .018 micro wire was passed through the needle, observed enter the abdominal aorta under fluoroscopy. The needle was removed, and a micropuncture sheath was placed over the wire. The  inner dilator and wire were removed, and an 035 Bentson wire was advanced under fluoroscopy into the abdominal aorta. The sheath was removed and a standard 5 [place] vascular sheath was placed. The dilator was removed and the sheath was flushed. Mickelson catheter was advanced on the Bentson wire. Catheter recur for was formed in the thoracic aorta. Catheter was withdrawn to the abdominal aorta and the wire was removed. Catheter was used to select the celiac artery. Angiogram was performed. High-flow catheter and a 14 fathom wire were advanced into the left gastric artery. Wire and the catheter were advanced into the replaced left hepatic artery. Angiogram was performed. Catheter was advanced to the division of segment 2 and segment 3. Angiogram was performed to confirm location. Embolization was then performed with a single vial of 500-700 micron embosphere to stasis. Repeat angiogram was performed. Microcatheter and the catheter were then removed. Ultrasound survey was performed of the common femoral vein with images stored and sent to PACs. Local anesthesia was provided with 1% Lidocaine. A single wall needle was used access the right common femoral vein under ultrasound. With excellent blood flow returned, a Bentson wire was passed through the needle, observed to enter the IVC under fluoroscopy. The needle was removed, and 10 [place] dilator was passed. The delivery sheath for a retrievable Bard Denali filter was passed over the Bentson wire into the IVC. The wire was removed and IVC cavagram performed. Dilator was removed, and the IVC filter was then delivered, positioned below the lowest renal vein. Repeat cavagram was not performed. The delivery sheath was removed. Manual pressure was used for hemostasis.  Exoseal was deployed at this point at the common femoral artery access point. Patient tolerated the procedure well and remained hemodynamically stable throughout. No complications were encountered and no  significant blood loss was encounter. FINDINGS: Ultrasound demonstrates patent common femoral vein and patent common femoral artery on the right. Angiogram demonstrates the expected arrangement of the celiac artery, with patent common hepatic artery, splenic artery, and left gastric artery. There is a replaced left hepatic artery from the left gastric artery. 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 A sentinel clot was identified on the prior CT. Embolization was performed with 500-700 micron embospheres to stasis. After embolization was performed, retrievable IVC filter was placed below the lowest renal vein. This was deployed at the L1-L2 level. IMPRESSION: Status post ultrasound guided access right common femoral artery for hepatic angiogram and bland embolization of hemorrhagic left HCC to stasis, with Exoseal deployed for hemostasis at the common femoral artery puncture site. Status post ultrasound guided access right common femoral vein for deployment of retrievable IVC filter. Signed, Dulcy Fanny. Dellia Nims, RPVI [facility] Radiology Electronically Signed   By: Corrie Mckusick D.O.   On: 07/26/2020 17:29   DG Chest Port 1 View  Result Date: 07/28/2020 CLINICAL DATA:  Shortness of breath and productive cough. 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 EXAM: PORTABLE CHEST 1 VIEW COMPARISON:  None. FINDINGS: No pneumothorax. Mild pulmonary venous congestion. Small bilateral pleural effusions with underlying atelectasis. No change in the cardiomediastinal silhouette. No other abnormalities. IMPRESSION: 1. Probable mild pulmonary venous congestion and small effusions. No other changes. Electronically Signed   By: Dorise Bullion III M.D   On: 07/28/2020 10:56   DG Chest Port 1 View  Result Date: 07/25/2020 CLINICAL DATA:   Abdominal hemorrhage EXAM: PORTABLE CHEST 1 VIEW COMPARISON:  Abdominal CT from yesterday. FINDINGS: Streaky density at the bases attributed atelectasis superimposed on chronic interstitial coarsening. There is no edema, consolidation, effusion, or pneumothorax. Normal heart size and mediastinal contours. IMPRESSION: Atelectasis at the bases as seen on preceding abdominal CT. Electronically Signed   By: Monte Fantasia M.D.   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 Despite acute renal dysfunction, angiogram is indicated for life-saving embolization. 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 EXAM: ULTRASOUND-GUIDED ACCESS RIGHT COMMON FEMORAL ARTERY ULTRASOUND GUIDED ACCESS RIGHT COMMON FEMORAL VEIN MESENTERIC ANGIOGRAM EMBOLIZATION OF HEMORRHAGIC LEFT HEPATIC Ardoch RETRIEVABLE IVC FILTER PLACEMENT. EXOSEAL DEPLOYED FOR HEMOSTASIS MEDICATIONS: 4 mg Zofran. The antibiotic was administered within 1 hour of the procedure ANESTHESIA/SEDATION: Moderate (conscious) sedation was employed during this procedure. A total of Versed 2.0 mg and Fentanyl 100 mcg was administered intravenously. Moderate Sedation Time: 60 minutes. The patient's level of consciousness and vital signs were monitored continuously by radiology nursing throughout the procedure under my direct supervision. CONTRAST:  46 cc FLUOROSCOPY TIME:  Fluoroscopy Time: 5 minutes 0 seconds COMPLICATIONS: None PROCEDURE: Informed consent was obtained from the patient following explanation of the procedure, risks, benefits and alternatives, regarding angiogram and embolization. Specifically, the chance of contrast induced nephropathy was reviewed. The patient understands, agrees and consents for the procedure. All questions were  addressed. A time out was  performed prior to the initiation of the procedure. Maximal barrier sterile technique utilized including caps, mask, sterile gowns, sterile gloves, large sterile drape, hand hygiene, and Betadine prep. The procedure, risks, benefits, and alternatives of IVC filter placement were explained to the patient. Specific risks discussed include bleeding, infection, contrast reaction, renal failure, IVC filter fracture, migration, iliocaval thrombus (3-4% incidence), need for further procedure, need for further surgery, pulmonary embolism, cardiopulmonary collapse, death. Questions regarding the procedure were encouraged and answered. The patient understands and consents to the procedure. Ultrasound survey of the right inguinal region was performed with images stored and sent to PACs, confirming patency of the vessel. A micropuncture needle was used access the right common femoral artery under ultrasound. With excellent arterial blood flow returned, and an .018 micro wire was passed through the needle, observed enter the abdominal aorta under fluoroscopy. The needle was removed, and a micropuncture sheath was placed over the wire. The inner dilator and wire were removed, and an 035 Bentson wire was advanced under fluoroscopy into the abdominal aorta. The sheath was removed and a standard 5 [place] vascular sheath was placed. The dilator was removed and the sheath was flushed. Mickelson catheter was advanced on the Bentson wire. Catheter recur for was formed in the thoracic aorta. Catheter was withdrawn to the abdominal aorta and the wire was removed. Catheter was used to select the celiac artery. Angiogram was performed. High-flow catheter and a 14 fathom wire were advanced into the left gastric artery. Wire and the catheter were advanced into the replaced left hepatic artery. Angiogram was performed. Catheter was advanced to the division of segment 2 and segment 3. Angiogram was performed to confirm  location. Embolization was then performed with a single vial of 500-700 micron embosphere to stasis. Repeat angiogram was performed. Microcatheter and the catheter were then removed. Ultrasound survey was performed of the common femoral vein with images stored and sent to PACs. Local anesthesia was provided with 1% Lidocaine. A single wall needle was used access the right common femoral vein under ultrasound. With excellent blood flow returned, a Bentson wire was passed through the needle, observed to enter the IVC under fluoroscopy. The needle was removed, and 10 [place] dilator was passed. The delivery sheath for a retrievable Bard Denali filter was passed over the Bentson wire into the IVC. The wire was removed and IVC cavagram performed. Dilator was removed, and the IVC filter was then delivered, positioned below the lowest renal vein. Repeat cavagram was not performed. The delivery sheath was removed. Manual pressure was used for hemostasis. Exoseal was deployed at this point at the common femoral artery access point. Patient tolerated the procedure well and remained hemodynamically stable throughout. No complications were encountered and no significant blood loss was encounter. FINDINGS: Ultrasound demonstrates patent common femoral vein and patent common femoral artery on the right. Angiogram demonstrates the expected arrangement of the celiac artery, with patent common hepatic artery, splenic artery, and left gastric artery. There is a replaced left hepatic artery from the left gastric artery. 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 A sentinel clot was identified on the prior CT. Embolization was performed with 500-700 micron embospheres to stasis. After embolization was performed, retrievable IVC filter was placed below the lowest renal vein. This was deployed at the L1-L2 level. IMPRESSION:  Status post ultrasound guided access right common femoral artery for hepatic angiogram and bland embolization of  hemorrhagic left HCC to stasis, with Exoseal deployed for hemostasis at the common femoral artery puncture site. Status post ultrasound guided access right common femoral vein for deployment of retrievable IVC filter. Signed, Dulcy Fanny. Dellia Nims, RPVI [facility] Electronically Signed   By: Corrie Mckusick D.O.   On: 07/26/2020 17:29   VAS [place] LOWER EXTREMITY VENOUS (DVT)  Result Date: 07/25/2020  Lower Venous DVTStudy Indications: Pulmonary embolism.  Risk Factors: Confirmed PE. Comparison Study: No prior studies. Performing Technologist: Oliver Hum RVT  Examination Guidelines: A complete evaluation includes B-mode imaging, spectral Doppler, color Doppler, and power Doppler as needed of all accessible portions of each vessel. Bilateral testing is considered an integral part of a complete examination. Limited examinations for reoccurring indications may be performed as noted. The reflux portion of the exam is performed with the patient in reverse Trendelenburg.  +---------+---------------+---------+-----------+----------+--------------+ RIGHT    CompressibilityPhasicitySpontaneityPropertiesThrombus Aging +---------+---------------+---------+-----------+----------+--------------+ CFV      Full           Yes      Yes                                 +---------+---------------+---------+-----------+----------+--------------+ SFJ      Full                                                        +---------+---------------+---------+-----------+----------+--------------+ FV Prox  Full                                                        +---------+---------------+---------+-----------+----------+--------------+ FV Mid   Full                                                         +---------+---------------+---------+-----------+----------+--------------+ FV DistalFull                                                        +---------+---------------+---------+-----------+----------+--------------+ PFV      Full                                                        +---------+---------------+---------+-----------+----------+--------------+ POP      Full           Yes      Yes                                 +---------+---------------+---------+-----------+----------+--------------+ PTV      Full                                                        +---------+---------------+---------+-----------+----------+--------------+  PERO     Full                                                        +---------+---------------+---------+-----------+----------+--------------+   +---------+---------------+---------+-----------+----------+--------------+ LEFT     CompressibilityPhasicitySpontaneityPropertiesThrombus Aging +---------+---------------+---------+-----------+----------+--------------+ CFV      Full           Yes      Yes                                 +---------+---------------+---------+-----------+----------+--------------+ SFJ      Full                                                        +---------+---------------+---------+-----------+----------+--------------+ FV Prox  Full                                                        +---------+---------------+---------+-----------+----------+--------------+ FV Mid   Full                                                        +---------+---------------+---------+-----------+----------+--------------+ FV DistalFull                                                        +---------+---------------+---------+-----------+----------+--------------+ PFV      Full                                                         +---------+---------------+---------+-----------+----------+--------------+ POP      Full           Yes      Yes                                 +---------+---------------+---------+-----------+----------+--------------+ PTV      Full                                                        +---------+---------------+---------+-----------+----------+--------------+ PERO     Full                                                        +---------+---------------+---------+-----------+----------+--------------+  Summary: RIGHT: - There is no evidence of deep vein thrombosis in the lower extremity.  - No cystic structure found in the popliteal fossa.  LEFT: - There is no evidence of deep vein thrombosis in the lower extremity.  - No cystic structure found in the popliteal fossa.  *See table(s) above for measurements and observations. Electronically signed by Monica Martinez MD on 07/25/2020 at 3:24:16 PM.    Final    CT Angio Abd/Pel w/ and/or w/o  Result Date: 07/26/2020 CLINICAL DATA:  History of extensive hepatocellular carcinoma, now with concern for hepatic hemorrhage. EXAM: CTA ABDOMEN AND PELVIS WITHOUT AND WITH CONTRAST TECHNIQUE: Multidetector CT imaging of the abdomen and pelvis was performed using the standard protocol during bolus administration of intravenous contrast. Multiplanar reconstructed images and MIPs were obtained and reviewed to evaluate the vascular anatomy. CONTRAST:  97mL OMNIPAQUE IOHEXOL 350 MG/ML SOLN COMPARISON:  CT abdomen and pelvis-earlier same day; 07/24/2020; 07/18/2020; chest CT - 07/19/2020; 04/24/2020 FINDINGS: VASCULAR Aorta: Moderate amount of mixed calcified and noncalcified atherosclerotic plaque within normal caliber abdominal aorta, not resulting in a hemodynamically significant stenosis. No abdominal aortic dissection or periaortic stranding. Celiac: There is a minimal amount of eccentric calcified and noncalcified atherosclerotic plaque involving  the origin the celiac artery, not definitely resulting in hemodynamically significant stenosis. Redemonstrated non conventional celiac arterial anatomy including an accessory though dominant left hepatic artery arising from the left gastric artery. Additionally, there is a separate origin of the GDA which arises directly from the celiac artery. SMA: Widely patent without hemodynamically significant narrowing. Renals: Solitary bilaterally; the bilateral renal arteries are widely patent without a hemodynamically significant narrowing. No vessel irregularity to suggest FMD. IMA: Remains patent. Inflow: Moderate amount of mixed calcified and noncalcified atherosclerotic plaque involves the bilateral common and external iliac arteries, not definitely resulting in hemodynamically significant stenosis. The bilateral internal iliac arteries are patent and of normal caliber. Proximal Outflow: The bilateral common and imaged portions of the bilateral superficial and deep femoral arteries appear patent and without hemodynamically significant narrowing, though note, evaluation degraded secondary to patient motion. Veins: The IVC and pelvic venous systems appear widely patent. Review of the MIP images confirms the above findings. _________________________________________________________ NON-VASCULAR Lower chest: Limited visualization of the lower thorax demonstrates minimal bibasilar subsegmental atelectasis, right greater than left. Note is made of a near occlusive filling defect within the image right lower lobe pulmonary artery (image 10, series 4), compatible with pulmonary embolism, unchanged compared to the dedicated chest CT performed 07/19/2020. Hepatobiliary: Redemonstrated extensive multifocal hepatocellular carcinoma including near complete involvement of the posterior segment of the right lobe of the liver with infiltrative tumor. Active extravasation is seen about the caudal aspect of the lateral segment of the left  lobe of the liver with subsequent pooling of extravasated contrast on acquired delayed phase images (image 79, series 4; image 79, series 6; image 82, series 7). The area of active extravasation appears to be predominantly supplied via the accessory (though dominant) left hepatic artery which arises from the left gastric artery. Portal vein remains patent. Normal appearance of the gallbladder. No radiopaque gallstones. No intra or extrahepatic biliary ductal dilatation. Redemonstrated hematoma within the ventral aspect of the upper abdominal omentum with dominant hematoma measuring approximately 10.1 x 4.5 cm (image 121, series 4), with associated moderate volume hemoperitoneum and ascites. Pancreas: Normal appearance of the pancreas. Spleen: Normal appearance of the spleen. No evidence of splenomegaly. Adrenals/Urinary Tract: There is symmetric enhancement of the bilateral kidneys. No  definite renal stones on this postcontrast examination. No discrete renal lesions. No urinary obstruction or perinephric stranding. Normal appearance the bilateral adrenal glands. Normal appearance of the urinary bladder given degree distention. Stomach/Bowel: No evidence of enteric obstruction. No pneumoperitoneum, pneumatosis or portal venous gas. Lymphatic: Scattered porta hepatis lymph nodes are not enlarged by size criteria. No bulky retroperitoneal, mesenteric, pelvic or inguinal lymphadenopathy. Reproductive: Normal appearance of the prostate gland. Other: Mild diffuse body wall anasarca. Musculoskeletal: Redemonstrated osseous metastatic disease including ill-defined lytic lesion involving the posterior elements of the right-side of the T9 vertebral body (image 34, series 4), an approximately 2.6 x 2.6 cm lytic lesion involving the T12 vertebral body (image 80, series 4), an at least 5.3 x 4.3 cm lytic lesion involving the right-side of L4 vertebral body and an approximately 3.6 x 3.5 cm lytic lesion involving the left side of  the sacral ala (image 186, series). IMPRESSION: VASCULAR 1. The examination is positive for active extravasation about the caudal aspect of the lateral segment of the left lobe of the liver secondary to bleeding hepatocellular carcinoma with dominant arterial supply via an accessory (though dominant) left hepatic artery arising from the left gastric artery. 2. Redemonstrated hematoma within the ventral aspect of the upper abdominal omentum with associated moderate volume hemoperitoneum and ascites. 3. 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 NON-VASCULAR 1. Similar appearance of known multifocal hepatocellular carcinoma. The portal vein remains patent. 2. Redemonstrated extensive osseous metastatic disease as above. Above findings reviewed with Dr. Earleen Newport at the time of examination completion. Electronically Signed   By: Sandi Mariscal M.D.   On: 07/26/2020 15:38    Lab Data:  CBC: Recent Labs  Lab 07/24/20 0709 07/24/20 0854 07/25/20 0207 07/25/20 0207 07/25/20 1355 07/25/20 1926 07/26/20 0257 07/26/20 1107 07/27/20 0256 07/27/20 0734 07/27/20 2133 07/28/20 0054 07/28/20 0611  WBC 9.5   < > 6.9  --  8.0  --  7.7  --  7.2  --  7.0  --   --   NEUTROABS 6.9  --   --   --   --   --   --   --   --   --  5.2  --   --   HGB 8.2*   < > 9.0*   < > 7.7*   < > 6.5*   < > 6.8* 6.8* 9.2* 9.2* 8.9*  HCT 26.9*   < > 28.8*   < > 24.4*   < > 20.5*   < > 20.7* 21.5* 28.0* 28.0* 27.8*  MCV 96.8   < > 93.2  --  93.1  --  94.5  --  90.0  --  90.6  --   --   PLT 260   < > 175  --  207  --  210  --  209  --  201  --   --    < > = values in this interval not displayed.   Basic Metabolic Panel: Recent Labs  Lab 07/24/20 0709 07/25/20 0207 07/26/20 0257 07/27/20 0256 07/28/20 0054  NA 140 137 138 137 138  K 4.5 4.2 4.9 4.3 4.0  CL 103 104 105 107 108  CO2 21* 21* 21* 20* 20*  GLUCOSE 178* 82  108* 100* 103*  BUN 24* 24* 32* 32* 22  CREATININE 3.03* 1.75* 2.21* 1.75* 1.24  CALCIUM 9.9 9.0 8.8* 8.5* 8.6*  MG  --  1.5* 2.6*  --   --   PHOS  --  3.4  --   --   --    GFR: Estimated Creatinine Clearance: 58.5 mL/min (by C-G formula based on SCr of 1.24 mg/dL). Liver Function Tests: Recent Labs  Lab 07/24/20 0709 07/26/20 0257 07/27/20 0734 07/28/20 0054  AST 204* 252* 343* 367*  ALT 79* 96* 143* 166*  ALKPHOS 282* 230* 244* 269*  BILITOT 2.1* 2.7* 2.1* 2.2*  PROT 8.0 6.6 6.6 6.5  ALBUMIN 3.3* 2.7* 2.8* 2.7*   Recent Labs  Lab 07/24/20 0709  LIPASE 39   No results for input(s): AMMONIA in the last 168 hours. Coagulation Profile: Recent Labs  Lab 07/24/20 0810  INR 1.5*   Cardiac Enzymes: No results for input(s): CKTOTAL, CKMB, CKMBINDEX, TROPONINI in the last 168 hours. BNP (last 3 results) No results for input(s): PROBNP in the last 8760 hours. HbA1C: No results for input(s): HGBA1C in the last 72 hours. CBG: Recent Labs  Lab 07/25/20 1726  GLUCAP 108*   Lipid Profile: No results for input(s): CHOL, HDL, LDLCALC, TRIG, CHOLHDL, LDLDIRECT in the last 72 hours. Thyroid Function Tests: No results for input(s): TSH, T4TOTAL, FREET4, T3FREE, THYROIDAB in the last 72 hours. Anemia Panel: No results for input(s): VITAMINB12, FOLATE, FERRITIN, TIBC, IRON, RETICCTPCT in the last 72 hours. Urine analysis:    Component Value Date/Time   COLORURINE YELLOW (A) 05/25/2018 1119   APPEARANCEUR CLEAR (A) 05/25/2018 1119   LABSPEC 1.016 05/25/2018 1119   PHURINE 5.0 05/25/2018 1119   GLUCOSEU NEGATIVE 05/25/2018 1119   HGBUR NEGATIVE 05/25/2018 1119   BILIRUBINUR NEGATIVE 05/25/2018 1119   KETONESUR NEGATIVE 05/25/2018 1119   PROTEINUR NEGATIVE 05/25/2018 1119   NITRITE NEGATIVE 05/25/2018 1119   LEUKOCYTESUR NEGATIVE 05/25/2018 1119     Brindle Leyba M.D. [facility] 07/28/2020, 12:47 PM   Call night coverage person covering after 7pm

## 2020-07-28 NOTE — Progress Notes (Signed)
Daily Progress Note   Patient Name: Vernon [name]       Date: 07/28/2020 DOB: February 13, 1961  Age: 59 y.o. MRN#: [id number] Attending Physician: Mendel Corning, MD Primary Care Physician: Lorelee Market, MD Admit Date: 07/24/2020  Reason for Consultation/Follow-up: Establishing goals of care  Subjective: I saw and examined Vernon [name] today.  We reviewed his clinical course overnight he reports feeling well other than having some increased shortness of breath, particularly when moving around.  Chest x-ray performed but has not yet been evaluated by radiology.  I talked with him about advanced care planning today.  He reports that he has 2 daughters, however, he feels that one of his daughters Delana Meyer?) would be in a better position to make decisions for him if he is unable to make his own decisions for himself.    We also discussed his overall clinical situation and multiple concerns regarding both short and long-term medical problems including his cancer that has been showing progression with current interventions, hepatic hemorrhage (status post embolization), anemia, PE without ability to restart Eliquis (IVC filter has been placed), and AKI (creatinine improving).  Discussed that prior to this event, plan was to trial bevacizumab but this is not an option due to bleeding and PE.  He reports having discussion with Dr. Burr Medico and wanting her input on situation now that his creatinine seems to be improving and bleeding appears to be under control following embolization.  We talked about goals moving forward and his desire to continue to pursue any interventions that are likely to add either time or quality to his life.  We also discussed again regarding consideration for placing limitations on  interventions that are not likely to help him live better or longer.  Discussed prior conversation he had with Dr. Burr Medico for consideration for DNR/DNI.  He reports this is something he does continue to consider, but he needs to speak further with his daughter before making any decisions.  Length of Stay: 4  Current Medications: Scheduled Meds:  . sodium chloride   Intravenous Once  . Chlorhexidine Gluconate Cloth  6 each Topical Daily  . mouth rinse  15 mL Mouth Rinse BID  . oxyCODONE  20 mg Oral Q12H    Continuous Infusions: . sodium chloride Stopped (07/28/20 1002)    PRN Meds:  docusate sodium, hydrALAZINE, ondansetron (ZOFRAN) IV, oxyCODONE, polyethylene glycol  Physical Exam         General: Alert, awake, in no acute distress.  Resp: Mild tachypnea Abdomen:distended Ext: No significant edema Skin: Warm and dry Neuro: Grossly intact, nonfocal.  Vital Signs: BP 131/76 (BP Location: Right Arm)   Pulse 89   Temp 100 F (37.8 C) (Oral)   Resp 19   Ht 5\' 7"  (1.702 m)   Wt 71.7 kg   SpO2 98%   BMI 24.76 kg/m  SpO2: SpO2: 98 % O2 Device: O2 Device: Nasal Cannula O2 Flow Rate: O2 Flow Rate (L/min): 2 L/min  Intake/output summary:   Intake/Output Summary (Last 24 hours) at 07/28/2020 1123 Last data filed at 07/27/2020 1730 Gross per 24 hour  Intake 699.5 ml  Output --  Net 699.5 ml   LBM: Last BM Date: 07/28/20 Baseline Weight: Weight: 67.9 kg Most recent weight: Weight: 71.7 kg       Palliative Assessment/Data:      Patient Active Problem List   Diagnosis Date Noted  . Abdominal pain 07/24/2020  . AKI (acute kidney injury) ([place])   . Intraperitoneal hemorrhage   . Bone metastasis ([place]) 04/30/2020  . Iron deficiency anemia due to chronic blood loss 04/26/2020  . Palliative care by specialist   . Right hip pain   . Pain of metastatic malignancy   . Pulmonary embolism without acute cor pulmonale ([place]) 04/25/2020  . 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 Right thigh pain 04/25/2020  . Anemia 04/25/2020  . Acute pulmonary embolism without acute cor pulmonale ([place]) 04/25/2020  . Fatigue 04/25/2020  . Goals of care, counseling/discussion 10/11/2018  . Hepatitis 10/11/2018  . Hepatitis C virus infection without hepatic coma 08/12/2018  . Hepatocellular carcinoma ([place]) 07/29/2018  . Abnormal iron saturation 07/29/2018    Palliative Care Assessment & Plan   Patient Profile: 59 y.o. 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  He was found to have acute blood loss anemia with bleeding of hepatic tumor.  He underwent embolization of left hepatic arteries on 8/27.  Prior to this, plan was for transition to immunotherapy and bevacizumab due to recent cancer progression.  He is no longer a candidate to receive bevacizumab due to the fact that he had PE and bleeding.  Consideration still for immunotherapy with Avastin, however, he is currently critically ill.  Palliative consulted for goals of care.  Recommendations/Plan:  Full Code/FullScope  [name] would like to name his daughter as his HCPOA.  I placed a consult to spiritual care to facilitate.  He reports needing to speak further with Dr. Burr Medico about his potential care plan and his daughter regarding goals of care before making any further decisions.  We did discuss looking at his hopes for any interventions moving forward when making decisions about what interventions to pursue versus placing limitations on certain interventions (such as CPR and intubation).  He reports understanding and states he needs more time to speak with his daughter regarding his wishes.  Goals of Care and Additional Recommendations:  Limitations on Scope of Treatment: Full Scope Treatment  Code Status:    Code Status  Orders  (From admission, onward)         Start     Ordered   07/24/20 1239  Full code  Continuous        07/24/20 1239        Code Status History    Date Active Date Inactive Code Status Order ID Comments User Context   04/25/2020 0401 04/26/2020 1919 Full Code [id number]  Vernelle Emerald, MD ED   Advance Care Planning Activity       Prognosis:   Unable to determine  Discharge Planning:  To Be Determined  Care plan was discussed with patient  Thank you for allowing the Palliative Medicine Team to assist in the care of this patient.   Time In: 1030 Time Out: 1110 Total Time 50 Prolonged Time Billed No      Greater than 50%  of this time was spent counseling and coordinating care related to the above assessment and plan.  Micheline Rough, MD  Please contact Palliative Medicine Team phone at [phone] for questions and concerns.

## 2020-07-29 ENCOUNTER — Other Ambulatory Visit: Payer: Medicaid Other

## 2020-07-29 ENCOUNTER — Ambulatory Visit: Payer: Medicaid Other

## 2020-07-29 ENCOUNTER — Ambulatory Visit: Payer: Medicaid Other | Admitting: Physician Assistant

## 2020-07-29 DIAGNOSIS — R58 Hemorrhage, not elsewhere classified: Secondary | ICD-10-CM

## 2020-07-29 LAB — CULTURE, BLOOD (ROUTINE X 2)
Culture: NO GROWTH
Culture: NO GROWTH
Special Requests: ADEQUATE

## 2020-07-29 LAB — COMPREHENSIVE METABOLIC PANEL
ALT: 131 U/L — ABNORMAL HIGH (ref 0–44)
AST: 239 U/L — ABNORMAL HIGH (ref 15–41)
Albumin: 2.7 g/dL — ABNORMAL LOW (ref 3.5–5.0)
Alkaline Phosphatase: 257 U/L — ABNORMAL HIGH (ref 38–126)
Anion gap: 10 (ref 5–15)
BUN: 16 mg/dL (ref 8–23)
CO2: 22 mmol/L (ref 22–32)
Calcium: 9.3 mg/dL (ref 8.9–10.3)
Chloride: 105 mmol/L (ref 98–111)
Creatinine, Ser: 0.92 mg/dL (ref 0.61–1.24)
GFR calc Af Amer: >60 mL/min (ref >60)
GFR calc non Af Amer: >60 mL/min (ref >60)
Glucose, Bld: 87 mg/dL (ref 70–99)
Potassium: 3.7 mmol/L (ref 3.5–5.1)
Sodium: 137 mmol/L (ref 135–145)
Total Bilirubin: 3.1 mg/dL — ABNORMAL HIGH (ref 0.3–1.2)
Total Protein: 7.2 g/dL (ref 6.5–8.1)

## 2020-07-29 LAB — CBC
HCT: 31.8 % — ABNORMAL LOW (ref 39.0–52.0)
Hemoglobin: 10.2 g/dL — ABNORMAL LOW (ref 13.0–17.0)
MCH: 29.8 pg (ref 26.0–34.0)
MCHC: 32.1 g/dL (ref 30.0–36.0)
MCV: 93 fL (ref 80.0–100.0)
Platelets: 235 10*3/uL (ref 150–400)
RBC: 3.42 MIL/uL — ABNORMAL LOW (ref 4.22–5.81)
RDW: 19.5 % — ABNORMAL HIGH (ref 11.5–15.5)
WBC: 6.7 10*3/uL (ref 4.0–10.5)
nRBC: 0 % (ref 0.0–0.2)

## 2020-07-29 NOTE — Plan of Care (Signed)

## 2020-07-29 NOTE — Progress Notes (Addendum)
Patient temp was recorded as 100.3. made on call [place] aware. Orders was given. Orders were discontinued per recommendation from attending nurse and attending on call practitioner. Will monitor temp closely.  @ 0020 rechecked patient temperature. Temp 98.5. will cont to monitor.

## 2020-07-29 NOTE — Progress Notes (Signed)
Daily Progress Note   Patient Name: Vernon [name]       Date: 07/29/2020 DOB: [birth year]  Age: 59 y.o. MRN#: [id number] Attending Physician: Guilford Shi, MD Primary Care Physician: Lorelee Market, MD Admit Date: 07/24/2020  Reason for Consultation/Follow-up: Establishing goals of care  Subjective: I saw and examined Vernon [name] today.  We talked about goals moving forward and his desire to continue to pursue any interventions that are likely to add either time or quality to his life.  He discussed conversation with Vernon [name] from oncology.  We reviewed his hope to have more chemotherapy and discussed plan to follow up with Vernon [name] to discuss further chemotherapy.  Length of Stay: 5  Current Medications: Scheduled Meds:  . sodium chloride   Intravenous Once  . mouth rinse  15 mL Mouth Rinse BID  . oxyCODONE  20 mg Oral Q12H    Continuous Infusions:   PRN Meds: docusate sodium, hydrALAZINE, ondansetron (ZOFRAN) IV, oxyCODONE, polyethylene glycol  Physical Exam         General: Alert, awake, in no acute distress.  Resp: Mild tachypnea Abdomen:distended Ext: No significant edema Skin: Warm and dry Neuro: Grossly intact, nonfocal.  Vital Signs: BP 130/81 (BP Location: Right Arm)   Pulse 95   Temp 98.3 F (36.8 C) (Oral)   Resp 18   Ht 5\' 7"  (1.702 m)   Wt 69.3 kg   SpO2 96%   BMI 23.93 kg/m  SpO2: SpO2: 96 % O2 Device: O2 Device: Room Air O2 Flow Rate: O2 Flow Rate (L/min): 2 L/min  Intake/output summary:  No intake or output data in the 24 hours ending 07/29/20 2238 LBM: Last BM Date: 07/29/20 Baseline Weight: Weight: 67.9 kg Most recent weight: Weight: 69.3 kg       Palliative Assessment/Data:      Patient Active Problem List   Diagnosis  Date Noted  . Abdominal pain 07/24/2020  . AKI (acute kidney injury) ([place])   . Intraabdominal hemorrhage   . Bone metastasis ([place]) 04/30/2020  . Iron deficiency anemia due to chronic blood loss 04/26/2020  . Palliative care by specialist   . Right hip pain   . Pain of metastatic malignancy   . Pulmonary embolism without acute cor pulmonale ([place]) 04/25/2020  . Portal vein thrombosis secondary to invasion  with hepatocellular carcinoma ([place]) 04/25/2020  . Right thigh pain 04/25/2020  . Anemia 04/25/2020  . Acute pulmonary embolism without acute cor pulmonale ([place]) 04/25/2020  . Fatigue 04/25/2020  . Goals of care, counseling/discussion 10/11/2018  . Hepatitis 10/11/2018  . Hepatitis C virus infection without hepatic coma 08/12/2018  . Hepatocellular carcinoma ([place]) 07/29/2018  . Abnormal iron saturation 07/29/2018    Palliative Care Assessment & Plan   Patient Profile: 59 y.o. 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  He was found to have acute blood loss anemia with bleeding of hepatic tumor.  He underwent embolization of left hepatic arteries on 8/27.  Prior to this, plan was for transition to immunotherapy and bevacizumab due to recent cancer progression.  He is no longer a candidate to receive bevacizumab due to the fact that he had PE and bleeding.  Consideration still for immunotherapy with Avastin, however, he is currently critically ill.  Palliative consulted for goals of care.  Recommendations/Plan:  Full Code/FullScope  Mr. Doescher would like to name his daughter as his HCPOA.  I placed a consult to spiritual care to facilitate.  Discussed that bleeding improved following embolization and kidney function improved, but hepatic function is still elevated.  He would like to discharge and follow up with Vernon [name] to further discuss treatment  options if he becomes candidate for further chemotherapy.  Goals of Care and Additional Recommendations:  Limitations on Scope of Treatment: Full Scope Treatment  Code Status:    Code Status Orders  (From admission, onward)         Start     Ordered   07/24/20 1239  Full code  Continuous        07/24/20 1239        Code Status History    Date Active Date Inactive Code Status Order ID Comments User Context   04/25/2020 0401 04/26/2020 1919 Full Code [id number]  Vernon Emerald, MD ED   Advance Care Planning Activity       Prognosis:   Unable to determine  Discharge Planning:  To Be Determined  Care plan was discussed with patient  Thank you for allowing the Palliative Medicine Team to assist in the care of this patient.   Total Time 30 Prolonged Time Billed No      Greater than 50%  of this time was spent counseling and coordinating care related to the above assessment and plan.  Micheline Rough, MD  Please contact Palliative Medicine Team phone at [phone] for questions and concerns.

## 2020-07-29 NOTE — Plan of Care (Signed)

## 2020-07-29 NOTE — Progress Notes (Addendum)
PROGRESS NOTE    Vernon [name]  HYW:[id number]  DOB: Mar 27, 1961  PCP: Lorelee Market, MD Admit date:07/24/2020 Chief compliant: Abdominal pain  59 year old male with 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  On 8/20, patient was seen in ED for worsening shortness of breath, found to have acute PE, Eliquis was increased to 10 mg twice daily. ED Course: Afebrile, creatinine 3.03, anion gap 16, alk phos 282, lactic acid 3, AST 204, hemoglobin 6.5, platelets 241.CT abdomen pelvis showed interval development of high density fluid in the pelvis consistent with hemorrhage complex hypodense abnormality anteriorly in the central portion of the abdomen consistent with intraperitoneal hematoma or hemorrhage.  Multiple lesions throughout the liver consistent with metastatic disease, large lytic destructive lesion in left sacrum L1 L4 vertebral bodies consistent with metastatic disease.Patient was hypotensive in ED with SBP of 83, was transfused 2 units packed RBC, BP improved, IR was consulted for possible embolization.  Patient was admitted by CCM to ICU.  Hospital course:8/26, had vasovagal episode, stat H&H stable 8/27, hemoglobin down to 6.5, CT abdomen pelvis showed increase in moderate volume hemoperitoneum.  Status post mesenteric angiogram, embolization of the left hepatic arteries, retrievable IVC filter placement 8/28 doing well, LFTs trending up, creatinine stable 1.7, improving from 2.2 on 8/27, hemoglobin 6.8  Subjective:  Patient noted to have O2 nasal cannula in his hand while he tries to clean his face.  Noted to be somewhat dyspneic while talking full sentences.  Also reports dyspnea on minimal exertion.  Currently O2 at 2.5 L.  Objective: Vitals:   07/29/20 0016 07/29/20 0500 07/29/20 0606 07/29/20 1404  BP: 132/72  (!) 151/81 129/75  Pulse: 95  91 86  Resp:    18 13  Temp: 98.4 F (36.9 C)  98.5 F (36.9 C) 98 F (36.7 C)  TempSrc: Oral  Oral Oral  SpO2: 96%  95% 99%  Weight:  69.3 kg    Height:       No intake or output data in the 24 hours ending 07/29/20 1931 Filed Weights   07/26/20 0500 07/27/20 0500 07/29/20 0500  Weight: 69.9 kg 71.7 kg 69.3 kg    Physical Examination: General: Moderately built, no acute distress noted Head ENT: Atraumatic normocephalic, PERRLA, neck supple Heart: S1-S2 heard, regular rate and rhythm, no murmurs.  No leg edema noted Lungs: Equal air entry bilaterally, no rhonchi or rales on exam, no accessory muscle use but appears somewhat dyspneic on talking full sentences Abdomen: Bowel sounds heard, soft, nontender, nondistended. No organomegaly.  No CVA tenderness Extremities: No pedal edema.  No cyanosis or clubbing. Neurological: Awake alert oriented x3, no focal weakness or numbness, strength and sensations to crude touch intact Skin: No wounds or rashes.     Data Reviewed: I have personally reviewed following labs and imaging studies  CBC: Recent Labs  Lab 07/24/20 0709 07/24/20 0854 07/25/20 1355 07/25/20 1926 07/26/20 0257 07/26/20 1107 07/27/20 0256 07/27/20 0256 07/27/20 0734 07/27/20 2133 07/28/20 0054 07/28/20 0611 07/29/20 0648  WBC 9.5   < > 8.0  --  7.7  --  7.2  --   --  7.0  --   --  6.7  NEUTROABS 6.9  --   --   --   --   --   --   --   --  5.2  --   --   --  HGB 8.2*   < > 7.7*   < > 6.5*   < > 6.8*   < > 6.8* 9.2* 9.2* 8.9* 10.2*  HCT 26.9*   < > 24.4*   < > 20.5*   < > 20.7*   < > 21.5* 28.0* 28.0* 27.8* 31.8*  MCV 96.8   < > 93.1  --  94.5  --  90.0  --   --  90.6  --   --  93.0  PLT 260   < > 207  --  210  --  209  --   --  201  --   --  235   < > = values in this interval not displayed.   Basic Metabolic Panel: Recent Labs  Lab 07/25/20 0207 07/26/20 0257 07/27/20 0256 07/28/20 0054 07/29/20 0648  NA 137 138 137 138 137  K 4.2 4.9 4.3 4.0 3.7  CL 104 105  107 108 105  CO2 21* 21* 20* 20* 22  GLUCOSE 82 108* 100* 103* 87  BUN 24* 32* 32* 22 16  CREATININE 1.75* 2.21* 1.75* 1.24 0.92  CALCIUM 9.0 8.8* 8.5* 8.6* 9.3  MG 1.5* 2.6*  --   --   --   PHOS 3.4  --   --   --   --    GFR: Estimated Creatinine Clearance: 78.8 mL/min (by C-G formula based on SCr of 0.92 mg/dL). Liver Function Tests: Recent Labs  Lab 07/24/20 0709 07/26/20 0257 07/27/20 0734 07/28/20 0054 07/29/20 0648  AST 204* 252* 343* 367* 239*  ALT 79* 96* 143* 166* 131*  ALKPHOS 282* 230* 244* 269* 257*  BILITOT 2.1* 2.7* 2.1* 2.2* 3.1*  PROT 8.0 6.6 6.6 6.5 7.2  ALBUMIN 3.3* 2.7* 2.8* 2.7* 2.7*   Recent Labs  Lab 07/24/20 0709  LIPASE 39   No results for input(s): AMMONIA in the last 168 hours. Coagulation Profile: Recent Labs  Lab 07/24/20 0810  INR 1.5*   Cardiac Enzymes: No results for input(s): CKTOTAL, CKMB, CKMBINDEX, TROPONINI in the last 168 hours. BNP (last 3 results) No results for input(s): PROBNP in the last 8760 hours. HbA1C: No results for input(s): HGBA1C in the last 72 hours. CBG: Recent Labs  Lab 07/25/20 1726  GLUCAP 108*   Lipid Profile: No results for input(s): CHOL, HDL, LDLCALC, TRIG, CHOLHDL, LDLDIRECT in the last 72 hours. Thyroid Function Tests: No results for input(s): TSH, T4TOTAL, FREET4, T3FREE, THYROIDAB in the last 72 hours. Anemia Panel: No results for input(s): VITAMINB12, FOLATE, FERRITIN, TIBC, IRON, RETICCTPCT in the last 72 hours. Sepsis Labs: Recent Labs  Lab 07/24/20 0810  LATICACIDVEN 3.0*    Recent Results (from the past 240 hour(s))  Blood culture (routine x 2)     Status: None   Collection Time: 07/24/20  8:10 AM   Specimen: BLOOD RIGHT HAND  Result Value Ref Range Status   Specimen Description   Final    BLOOD RIGHT HAND Performed at [facility] [address], [place], [place] [address]    Special Requests   Final    BOTTLES DRAWN AEROBIC AND ANAEROBIC Blood Culture  results may not be optimal due to an inadequate volume of blood received in culture bottles Performed at [facility] [address] [place], [place] [address]    Culture   Final    NO GROWTH 5 DAYS Performed at [facility], [place] [address], [place], [place] [address]  Report Status 07/29/2020 FINAL  Final  Blood culture (routine x 2)     Status: None   Collection Time: 07/24/20  8:15 AM   Specimen: BLOOD  Result Value Ref Range Status   Specimen Description   Final    BLOOD LEFT WRIST Performed at [facility] [address] [place], [place] [address]    Special Requests   Final    BOTTLES DRAWN AEROBIC ONLY Blood Culture adequate volume Performed at [facility] [address] [place], [place] [address]    Culture   Final    NO GROWTH 5 DAYS Performed at [facility], [place] [address], [place], [place] [address]    Report Status 07/29/2020 FINAL  Final  SARS Coronavirus 2 by RT PCR (hospital order, performed in [facility] hospital lab) Nasopharyngeal Nasopharyngeal Swab     Status: None   Collection Time: 07/24/20  8:17 AM   Specimen: Nasopharyngeal Swab  Result Value Ref Range Status   SARS Coronavirus 2 NEGATIVE NEGATIVE Final    Comment: (NOTE) SARS-CoV-2 target nucleic acids are NOT DETECTED.  The SARS-CoV-2 RNA is generally detectable in upper and lower respiratory specimens during the acute phase of infection. The lowest concentration of SARS-CoV-2 viral copies this assay can detect is 250 copies / mL. A negative result does not preclude SARS-CoV-2 infection and should not be used as the sole basis for treatment or other patient management decisions.  A negative result may occur with improper specimen collection / handling, submission of specimen other than nasopharyngeal swab, presence of viral mutation(s) within the areas targeted by this assay, and inadequate number of viral copies (<250  copies / mL). A negative result must be combined with clinical observations, patient history, and epidemiological information.  Fact Sheet for Patients:   StrictlyIdeas.no  Fact Sheet for Healthcare Providers: BankingDealers.co.za  This test is not yet approved or  cleared by the [place] FDA and has been authorized for detection and/or diagnosis of SARS-CoV-2 by FDA under an Emergency Use Authorization (EUA).  This EUA will remain in effect (meaning this test can be used) for the duration of the COVID-19 declaration under Section 564(b)(1) of the Act, 21 U.S.C. section 360bbb-3(b)(1), unless the authorization is terminated or revoked sooner.  Performed at [facility], [place] [address] [place], [place] [address]   MRSA PCR Screening     Status: None   Collection Time: 07/24/20  5:19 PM   Specimen: Nasopharyngeal  Result Value Ref Range Status   MRSA by PCR NEGATIVE NEGATIVE Final    Comment:        The GeneXpert MRSA Assay (FDA approved for NASAL specimens only), is one component of a comprehensive MRSA colonization surveillance program. It is not intended to diagnose MRSA infection nor to guide or monitor treatment for MRSA infections. Performed at [facility], [place] [address] [place], [place] [address]       Radiology Studies: [facility] Chest Port 1 View  Result Date: 07/28/2020 CLINICAL DATA:  Shortness of breath and productive cough. 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 EXAM: PORTABLE CHEST 1 VIEW COMPARISON:  None. FINDINGS: No pneumothorax. Mild pulmonary venous congestion. Small bilateral pleural effusions with underlying atelectasis. No change in the cardiomediastinal silhouette. No other abnormalities. IMPRESSION: 1. Probable mild pulmonary venous congestion and small effusions. No other changes. Electronically Signed   By: Dorise Bullion III M.D   On:  07/28/2020 10:56  Scheduled Meds: . sodium chloride   Intravenous Once  . mouth rinse  15 mL Mouth Rinse BID  . oxyCODONE  20 mg Oral Q12H   Continuous Infusions:   Assessment/Plan:  1.  Hemorrhagic shock due to intraperitoneal hemorrhage: In the setting of high-dose anticoagulation.  Patient has required multiple blood transfusions during the hospital course.  Initially admitted to ICU and underwent mesenteric angiogram/embolization of left hepatic arteries by IR.  Anticoagulation obviously discontinued.  Hemoglobin stable now around 8-9.  2.  Acute blood loss anemia:Hemoglobin 6.5 at the time of admission, baseline 8- 10, received 2 units pRBC's on admission, 1 unit on 8/27,  2 units on 8/28.  H&H currently stable off anticoagulation, continue to monitor closely.  3. History of recent recurrent PE while on Eliquis-patient had Eliquis dosage increased recently but given above events, Eliquis now held, status post Kcentra reversal in the ED.  Doppler lower extremity negative for DVT.  Recurrent clots likely related to malignancy.  Hematology following along.  Patient underwent IVC filter placement and per heme-onc hold off on any anticoagulation as of now.  4.  AKI with lactic acidosis: Patient's creatinine on presentation was 3.0, baseline around 1.2 and now improved with fluid resuscitation.  Nephrology also evaluated patient during the hospital course as there was concern for possible contrast nephropathy.  5.  Acute hypoxic respiratory failure: Patient has been complaining of dyspnea over the last 2 days and continues to require 2 L of O2.  BNP 433 and chest x-ray suggestive of pulmonary venous congestion and small effusions.  I's and O's showed net positive and started on Lasix 20 mg IV twice daily yesterday.  Will renew as patient still feels somewhat dyspneic.  Will obtain walking O2 desat studies in a.m.  6. Metastatic hepatocellular carcinoma-Followed by Dr. Burr Medico, initial  diagnosis in 06/2018, metastatic, status post XRT, on chemotherapy, (lenvatinib), intermittently. Oncology consulted, seen by Dr. Burr Medico, had recently discussed switching his treatment to immunotherapy and bevacizumab, contraindicated due to PE and bleeding.    May consider single dose immunotherapy.  Overall prognosis poor  7. Chronic pain secondary to malignancy:Continue pain control Palliative medicine following  8.  Transaminitis: Secondary to shock liver versus malignancy.  Continue to monitor.   DVT prophylaxis: Chemoprophylaxis deferred due to bleeding now s/p IVC filter. Code Status: Full code Family / Patient Communication: Discussed with patient Disposition Plan:   Status is: Inpatient  Remains inpatient appropriate because:Ongoing diagnostic testing needed not appropriate for outpatient work up   Dispo: The patient is from: Home              Anticipated d/c is to: Home              Anticipated d/c date is: 1 day              Patient currently is not medically stable to d/c.           Time spent: 35 min     >50% time spent in discussions with care team and coordination of care.    Guilford Shi, MD [facility] Pager in [place]  If 7PM-7AM, please contact night-coverage [email or web address] 07/29/2020, 7:31 PM

## 2020-07-29 NOTE — Progress Notes (Signed)
Chaplain had a pleasant visit with patient.  When chaplain saw that patient was on the phone when she entered the room, the patient immediately invited her to come and sit and told the person on the phone " call me back later."  Patient is from the [place] area.  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  "But I haven't been to church in awhile."  Patient states he is not married but in a long term relationship with [place].  He has one daughter, Isam Unrein, who lives in [place].  When asked about executing an AD to name either Ms. Pervetta or Jasmine as POA, patient says, "I don't think that is necessary." Patient repeated this again.  He says his SO, [place] and Delana Meyer will talk if it comes to that, and that he has no worries. They will know what to do.  Delana Meyer is my next of kin, but she'll  talk to [place]." Patient continues.    "If the [facility] calls me, then I'll go." Patient feels at peace with his illness and believes that everything will work out according to [employer] will. Chaplain offered prayer for patient and patient thanked her. Rev. Tamsen Snider Pager [phone]

## 2020-07-29 NOTE — Progress Notes (Addendum)
Vernon [name]   DOB:03/26/1961   GB#:[id number]   PXT#:[id number]  Oncology follow up  Subjective: The patient reports that he feels better this morning.  He denies abdominal pain, nausea, vomiting.  Bowels are moving and he reports brown stool.  No melena or hematochezia reported.  Appetite still decreased.  Objective:  Vitals:   07/29/20 0016 07/29/20 0606  BP: 132/72 (!) 151/81  Pulse: 95 91  Resp:  18  Temp: 98.4 F (36.9 C) 98.5 F (36.9 C)  SpO2: 96% 95%    Body mass index is 23.93 kg/m.  Intake/Output Summary (Last 24 hours) at 07/29/2020 0918 Last data filed at 07/28/2020 1428 Gross per 24 hour  Intake 300 ml  Output 1 ml  Net 299 ml     Sclerae unicteric  Alert and oriented   Abdomen soft, no tenderness with palpation  Neuro nonfocal    CBG (last 3)  No results for input(s): GLUCAP in the last 72 hours.   Labs:   Urine Studies No results for input(s): UHGB, CRYS in the last 72 hours.  Invalid input(s): UACOL, UAPR, USPG, UPH, UTP, UGL, UKET, UBIL, UNIT, UROB, ULEU, UEPI, UWBC, URBC, UBAC, CAST, [place], [place]  Basic Metabolic Panel: Recent Labs  Lab 07/25/20 0207 07/25/20 0207 07/26/20 0257 07/26/20 0257 07/27/20 0256 07/27/20 0256 07/28/20 0054 07/29/20 0648  NA 137  --  138  --  137  --  138 137  K 4.2   < > 4.9   < > 4.3   < > 4.0 3.7  CL 104  --  105  --  107  --  108 105  CO2 21*  --  21*  --  20*  --  20* 22  GLUCOSE 82  --  108*  --  100*  --  103* 87  BUN 24*  --  32*  --  32*  --  22 16  CREATININE 1.75*  --  2.21*  --  1.75*  --  1.24 0.92  CALCIUM 9.0  --  8.8*  --  8.5*  --  8.6* 9.3  MG 1.5*  --  2.6*  --   --   --   --   --   PHOS 3.4  --   --   --   --   --   --   --    < > = values in this interval not displayed.   GFR Estimated Creatinine Clearance: 78.8 mL/min (by C-G formula based on SCr of 0.92 mg/dL). Liver Function Tests: Recent Labs  Lab 07/24/20 0709 07/26/20 0257 07/27/20 0734 07/28/20 0054 07/29/20 0648  AST 204*  252* 343* 367* 239*  ALT 79* 96* 143* 166* 131*  ALKPHOS 282* 230* 244* 269* 257*  BILITOT 2.1* 2.7* 2.1* 2.2* 3.1*  PROT 8.0 6.6 6.6 6.5 7.2  ALBUMIN 3.3* 2.7* 2.8* 2.7* 2.7*   Recent Labs  Lab 07/24/20 0709  LIPASE 39   No results for input(s): AMMONIA in the last 168 hours. Coagulation profile Recent Labs  Lab 07/24/20 0810  INR 1.5*    CBC: Recent Labs  Lab 07/24/20 0709 07/24/20 0854 07/25/20 1355 07/25/20 1926 07/26/20 0257 07/26/20 1107 07/27/20 0256 07/27/20 0256 07/27/20 0734 07/27/20 2133 07/28/20 0054 07/28/20 0611 07/29/20 0648  WBC 9.5   < > 8.0  --  7.7  --  7.2  --   --  7.0  --   --  6.7  NEUTROABS 6.9  --   --   --   --   --   --   --   --  5.2  --   --   --   HGB 8.2*   < > 7.7*   < > 6.5*   < > 6.8*   < > 6.8* 9.2* 9.2* 8.9* 10.2*  HCT 26.9*   < > 24.4*   < > 20.5*   < > 20.7*   < > 21.5* 28.0* 28.0* 27.8* 31.8*  MCV 96.8   < > 93.1  --  94.5  --  90.0  --   --  90.6  --   --  93.0  PLT 260   < > 207  --  210  --  209  --   --  201  --   --  235   < > = values in this interval not displayed.   Cardiac Enzymes: No results for input(s): CKTOTAL, CKMB, CKMBINDEX, TROPONINI in the last 168 hours. BNP: Invalid input(s): POCBNP CBG: Recent Labs  Lab 07/25/20 1726  GLUCAP 108*   D-Dimer No results for input(s): DDIMER in the last 72 hours. Hgb A1c No results for input(s): HGBA1C in the last 72 hours. Lipid Profile No results for input(s): CHOL, HDL, LDLCALC, TRIG, CHOLHDL, LDLDIRECT in the last 72 hours. Thyroid function studies No results for input(s): TSH, T4TOTAL, T3FREE, THYROIDAB in the last 72 hours.  Invalid input(s): FREET3 Anemia work up No results for input(s): VITAMINB12, FOLATE, FERRITIN, TIBC, IRON, RETICCTPCT in the last 72 hours. Microbiology Recent Results (from the past 240 hour(s))  Blood culture (routine x 2)     Status: None   Collection Time: 07/24/20  8:10 AM   Specimen: BLOOD RIGHT HAND  Result Value Ref Range  Status   Specimen Description   Final    BLOOD RIGHT HAND Performed at [facility] [address] [place], [place] [address]    Special Requests   Final    BOTTLES DRAWN AEROBIC AND ANAEROBIC Blood Culture results may not be optimal due to an inadequate volume of blood received in culture bottles Performed at [facility] [address], [place], [place] [address]    Culture   Final    NO GROWTH 5 DAYS Performed at [facility], [place] [address] [place], [place] [address]    Report Status 07/29/2020 FINAL  Final  Blood culture (routine x 2)     Status: None   Collection Time: 07/24/20  8:15 AM   Specimen: BLOOD  Result Value Ref Range Status   Specimen Description   Final    BLOOD LEFT WRIST Performed at [facility] [address] [place], [place] [address]    Special Requests   Final    BOTTLES DRAWN AEROBIC ONLY Blood Culture adequate volume Performed at [facility] [address], [place], [place] [address]    Culture   Final    NO GROWTH 5 DAYS Performed at [facility], [place] [address], [place],  [address]    Report Status 07/29/2020 FINAL  Final  SARS Coronavirus 2 by RT PCR (hospital order, performed in [facility] hospital lab) Nasopharyngeal Nasopharyngeal Swab     Status: None   Collection Time: 07/24/20  8:17 AM   Specimen: Nasopharyngeal Swab  Result Value Ref Range Status   SARS Coronavirus 2 NEGATIVE NEGATIVE Final    Comment: (NOTE) SARS-CoV-2 target nucleic acids are NOT DETECTED.  The SARS-CoV-2 RNA is generally detectable in upper and lower respiratory specimens during the acute phase  of infection. The lowest concentration of SARS-CoV-2 viral copies this assay can detect is 250 copies / mL. A negative result does not preclude SARS-CoV-2 infection and should not be used as the sole basis for treatment or other patient management decisions.  A negative  result may occur with improper specimen collection / handling, submission of specimen other than nasopharyngeal swab, presence of viral mutation(s) within the areas targeted by this assay, and inadequate number of viral copies (<250 copies / mL). A negative result must be combined with clinical observations, patient history, and epidemiological information.  Fact Sheet for Patients:   StrictlyIdeas.no  Fact Sheet for Healthcare Providers: BankingDealers.co.za  This test is not yet approved or  cleared by the [place] FDA and has been authorized for detection and/or diagnosis of SARS-CoV-2 by FDA under an Emergency Use Authorization (EUA).  This EUA will remain in effect (meaning this test can be used) for the duration of the COVID-19 declaration under Section 564(b)(1) of the Act, 21 U.S.C. section 360bbb-3(b)(1), unless the authorization is terminated or revoked sooner.  Performed at [facility], [place] [address] [place], [place] [address]   MRSA PCR Screening     Status: None   Collection Time: 07/24/20  5:19 PM   Specimen: Nasopharyngeal  Result Value Ref Range Status   MRSA by PCR NEGATIVE NEGATIVE Final    Comment:        The GeneXpert MRSA Assay (FDA approved for NASAL specimens only), is one component of a comprehensive MRSA colonization surveillance program. It is not intended to diagnose MRSA infection nor to guide or monitor treatment for MRSA infections. Performed at [facility], [place] [address], [place], [place] [address]       Studies:  DG Chest Port 1 View  Result Date: 07/28/2020 CLINICAL DATA:  Shortness of breath and productive cough. 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 EXAM: PORTABLE CHEST 1 VIEW COMPARISON:  None. FINDINGS: No pneumothorax. Mild pulmonary venous congestion. Small bilateral pleural effusions with underlying atelectasis.  No change in the cardiomediastinal silhouette. No other abnormalities. IMPRESSION: 1. Probable mild pulmonary venous congestion and small effusions. No other changes. Electronically Signed   By: Dorise Bullion III M.D   On: 07/28/2020 10:56    Assessment: 58 y.o.  Male   1.  Anemia secondary to blood loss 2.  Intra-abdominal hematoma 3.  Recurrent PE, on eliquis  4.  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  Liver cirrhosis and hepatitis C 7. AKI   Plan:  -S/p embolization and IVC filter placement on 07/26/2020. -Denies any evidence of bleeding.  Hemoglobin improved to 10.2 this morning.  -Continue to hold anticoagulation due to severe bleeding.  We will likely not restart due to high risk of bleeding. -He has recurrence of acute PE, likely related to his underlying malignancy.  Bilateral Doppler ultrasound did not show evidence of DVT.  Status post IVC filter placement. -We recently discussed switching his cancer treatment to immunotherapy and bevacizumab due to his recent cancer progression.  Unfortunately bevacizumab will be contraindicated due to his PE and bleeding.  Renal function is back to baseline.  Liver function has still not improved. If the patient is able to recover well enough, may consider immunotherapy as a single agent. -I will continue f/u in hospital.    Mikey Bussing, NP 07/29/2020    Addendum  I have seen the patient, examined him. I agree with the assessment and and plan and have edited the notes.   Vernon [name] is clinically stable, hemoglobin has been stable since the embolization 3 days ago.  He may be able to go home tomorrow.  I reviewed his lab, AKI resolved, however his liver function is getting worse.  We discussed that his liver cancer treatment is limited to immunotherapy alone, which usually does not shrink the tumor rapidly, and the response  rate is only about 20%.  We discussed palliative care and hospice if he is not able to get on treatment due to worsening liver function at home performance status.  He voiced good understanding, would like to see how he does at home after discharge, and follow-up with me next week to see if he can start Tecentriq. I will set up his appointments. OK to discharge home tomorrow from my standpoint.   Truitt Merle  07/29/2020

## 2020-07-30 DIAGNOSIS — C7951 Secondary malignant neoplasm of bone: Secondary | ICD-10-CM

## 2020-07-30 DIAGNOSIS — K759 Inflammatory liver disease, unspecified: Secondary | ICD-10-CM

## 2020-07-30 DIAGNOSIS — D500 Iron deficiency anemia secondary to blood loss (chronic): Secondary | ICD-10-CM

## 2020-07-30 DIAGNOSIS — D62 Acute posthemorrhagic anemia: Secondary | ICD-10-CM

## 2020-07-30 DIAGNOSIS — M25551 Pain in right hip: Secondary | ICD-10-CM

## 2020-07-30 DIAGNOSIS — I2782 Chronic pulmonary embolism: Secondary | ICD-10-CM

## 2020-07-30 DIAGNOSIS — B1920 Unspecified viral hepatitis C without hepatic coma: Secondary | ICD-10-CM

## 2020-07-30 LAB — COMPREHENSIVE METABOLIC PANEL
ALT: 108 U/L — ABNORMAL HIGH (ref 0–44)
AST: 189 U/L — ABNORMAL HIGH (ref 15–41)
Albumin: 2.9 g/dL — ABNORMAL LOW (ref 3.5–5.0)
Alkaline Phosphatase: 249 U/L — ABNORMAL HIGH (ref 38–126)
Anion gap: 11 (ref 5–15)
BUN: 18 mg/dL (ref 8–23)
CO2: 23 mmol/L (ref 22–32)
Calcium: 9.3 mg/dL (ref 8.9–10.3)
Chloride: 103 mmol/L (ref 98–111)
Creatinine, Ser: 0.9 mg/dL (ref 0.61–1.24)
GFR calc Af Amer: >60 mL/min (ref >60)
GFR calc non Af Amer: >60 mL/min (ref >60)
Glucose, Bld: 102 mg/dL — ABNORMAL HIGH (ref 70–99)
Potassium: 4.2 mmol/L (ref 3.5–5.1)
Sodium: 137 mmol/L (ref 135–145)
Total Bilirubin: 2.9 mg/dL — ABNORMAL HIGH (ref 0.3–1.2)
Total Protein: 7.6 g/dL (ref 6.5–8.1)

## 2020-07-30 LAB — CBC
HCT: 34 % — ABNORMAL LOW (ref 39.0–52.0)
Hemoglobin: 10.9 g/dL — ABNORMAL LOW (ref 13.0–17.0)
MCH: 30.2 pg (ref 26.0–34.0)
MCHC: 32.1 g/dL (ref 30.0–36.0)
MCV: 94.2 fL (ref 80.0–100.0)
Platelets: 256 10*3/uL (ref 150–400)
RBC: 3.61 MIL/uL — ABNORMAL LOW (ref 4.22–5.81)
RDW: 19.5 % — ABNORMAL HIGH (ref 11.5–15.5)
WBC: 7.1 10*3/uL (ref 4.0–10.5)
nRBC: 0 % (ref 0.0–0.2)

## 2020-07-30 MED ORDER — DOCUSATE SODIUM 100 MG PO CAPS: 100.0000 mg | ORAL_CAPSULE | Freq: Two times a day (BID) | ORAL | 0 refills | Status: AC | PRN

## 2020-07-30 NOTE — TOC Transition Note (Signed)
Transition of Care [facility]) - CM/SW Discharge Note   Patient Details  Name: Vernon [name] MRN: [id number] Date of Birth: [birth year]  Transition of Care [facility]) CM/SW Contact:  Shade Flood, LCSW Phone Number: 07/30/2020, 4:45 PM   Clinical Narrative:     Pt stable for dc today per MD. Pt with orders for RW and HH PT. Arranged RW with [employer] and it will be delivered to pt's room prior to dc. Contacted reps from [place], [facility], [facility], [facility], [facility], [facility] for HHPT. No agency will accept pt either due to being out of network with pt's insurance or due to "staffing issues".  Updated pt and offered outpatient PT referral which pt declined. Informed pt that if he changes his mind, he can contact his PCP and request a referral. Pt verbalized understanding.  Updated pt's RN and MD. There are no other TOC needs for dc.  Final next level of care: Home/Self Care Barriers to Discharge: No [place] will accept this patient   Patient Goals and CMS Choice Patient states their goals for this hospitalization and ongoing recovery are:: to go home CMS Medicare.gov Compare Post Acute Care list provided to:: Patient    Discharge Placement                       Discharge Plan and Services   Discharge Planning Services: CM Consult            DME Arranged: Gilford Rile rolling DME Agency: [employer] Date DME Agency Contacted: 07/30/20   Representative spoke with at DME Agency: [place] ([place]) Interventions     Readmission Risk Interventions No flowsheet data found.

## 2020-07-30 NOTE — Progress Notes (Signed)
Pt to be discharged to home this afternoon. Pt and Pt's family given discharge instructions including all Medications and schedules for these Medications reviewed with the Pt. Understanding verbalized of all discharge teaching. Discharge packet with the Pt at time of discharge

## 2020-07-30 NOTE — Discharge Summary (Signed)
Physician Discharge Summary  Vernon [name] JSE:[id number] DOB: 1961-10-08 DOA: 07/24/2020  PCP: Lorelee Market, MD  Admit date: 07/24/2020 Discharge date: 07/30/2020 Consultations: Hematology/Oncology , Interventional radiology Admitted From: home Disposition: home   Discharge Diagnoses:  Principal Problem:   Intraabdominal hemorrhage Active Problems:   Acute blood loss anemia   Hepatocellular carcinoma ([place])   Hepatitis C virus infection without hepatic coma   Hepatitis   Pulmonary embolism without acute cor pulmonale (HCC)   Fatigue   Iron deficiency anemia due to chronic blood loss   Palliative care by specialist   Right hip pain   Bone metastasis (HCC)   Abdominal pain   AKI (acute kidney injury) [facility])   Hospital Course Summary: 59 year old male with 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 On 8/20, patient was seen in ED for worsening shortness of breath, found to have acute PE, Eliquis was increased to 10 mg twice daily. ED Course: Afebrile,creatinine 3.03, anion gap 16, alk phos 282, lactic acid 3, AST 204, hemoglobin 6.5,platelets 241.CT abdomen pelvis showed interval development of high density fluid in the pelvis consistent with hemorrhage complex hypodense abnormality anteriorly in the central portion of the abdomen consistent with intraperitoneal hematoma or hemorrhage.Multiple lesions throughout the liver consistent with metastatic disease, large lytic destructive lesion in left sacrum L1 L4 vertebral bodies consistent with metastatic disease.Patient was hypotensive in ED with SBP of 83, was transfused 2 units packed RBC, BP improved, IR was consulted for possible embolization. Patient was admitted by CCM to ICU.  Hospital course: Patient underwent embolization by IR , was evaluated by Hematology during the hospital course . On 8/26, had  vasovagal episode, stat H&H stable. On 8/27, hemoglobin down to 6.5, CT abdomen pelvis showed increase in moderate volume hemoperitoneum. Underwent emergent mesenteric angiogram, embolization of the left hepatic arteries, subsequently retrievable IVC filter placed per Hematology recommendations. On 8/28 LFTs trending up.  1.  Hemorrhagic shock due to intraperitoneal hemorrhage: In the setting of high-dose anticoagulation.  Patient has required multiple blood transfusions during the hospital course.  Initially admitted to ICU and underwent mesenteric angiogram/embolization of left hepatic arteries by IR.  Anticoagulation obviously discontinued.  Hemoglobin stable now around 8-9.  2.  Acute blood loss anemia:Hemoglobin 6.5 at the time of admission, baseline 8- 10, received 2 units pRBC's on admission, 1 unit on 8/27, 2 units on 8/28.  H&H currently stable off anticoagulation, continue to monitor periodically as outpatient.  3. History of recent recurrent PE while on Eliquis-patient had Eliquis dosage increased recently but given above events, Eliquis now held, status post Kcentra reversal in the ED.  Doppler lower extremity negative for DVT.  Recurrent clots likely related to malignancy.  Hematology following along.  Patient underwent IVC filter placement and per heme-onc hold off on any anticoagulation as of now.  4.  AKI with lactic acidosis: Patient's creatinine on presentation was 3.0, baseline around 1.2 and now improved with fluid resuscitation.  Nephrology also evaluated patient during the hospital course as there was concern for possible contrast nephropathy.  5.  Acute hypoxic respiratory failure: Patient complained of dyspnea over the weekend and was hypoxic requiring 2 L of O2.  BNP 433 and chest x-ray suggestive of pulmonary venous congestion and small effusions.  I's and O's showed net positive and started on Lasix 20 mg IV twice daily. Patient feels better after  2 days of diuretics and did  well on walking desat study today by PT.   6. Metastatic hepatocellular carcinoma-Followed by Dr. Burr Medico, initial diagnosis in 06/2018, metastatic, status post XRT, on chemotherapy, (lenvatinib), intermittently. Oncology consulted, seen by Dr. Burr Medico, had recently discussed switching his treatment to immunotherapy and bevacizumab, contraindicated due to PE and bleeding.   May consider single dose immunotherapy.  Overall prognosis poor  7. Chronic pain secondary to malignancy:Continue pain control Palliative medicinefollowing as outpatient. Resumed home opiate regimen.   8.  Transaminitis: Secondary to shock liver versus malignancy.  Improving today. Follow up oncology on discharge  Discharge Exam:   Vitals:   07/29/20 2206 07/30/20 0500 07/30/20 0600 07/30/20 1322  BP: 130/81  (!) 141/89 129/73  Pulse: 95  95 91  Resp: 18  (!) 21 18  Temp: 98.3 F (36.8 C)  98 F (36.7 C) 99.1 F (37.3 C)  TempSrc: Oral  Oral Oral  SpO2: 96%  99% 97%  Weight:  71.2 kg    Height:        General: Pt is alert, awake, not in acute distress Cardiovascular: RRR, S1/S2 +, no rubs, no gallops Respiratory: CTA bilaterally, no wheezing, no rhonchi Abdominal: Soft, NT, ND, bowel sounds + Extremities: no edema, no cyanosis  Discharge Condition:Stable CODE STATUS: Full code Diet recommendation: low salt Recommendations for Outpatient Follow-up:  1. Follow up with PCP: 5 days 2. Follow up with consultants: Oncology and IR as scheduled 3. Please obtain follow up labs including: CBC, BMP  Home Health services upon discharge: HH PT Equipment/Devices upon discharge: Walker   Discharge Instructions:  Discharge Instructions    Call MD for:  difficulty breathing, headache or visual disturbances   Complete by: As directed    Call MD for:  extreme fatigue   Complete by: As directed    Call MD for:  persistant dizziness or light-headedness   Complete by: As directed    Call MD for:  persistant nausea and  vomiting   Complete by: As directed    Call MD for:  severe uncontrolled pain   Complete by: As directed    Call MD for:  temperature >100.4   Complete by: As directed    Diet - low sodium heart healthy   Complete by: As directed    Increase activity slowly   Complete by: As directed    No wound care   Complete by: As directed      Allergies as of 07/30/2020      Reactions   Ms Contin [morphine Sulfate Er] Nausea And Vomiting      Medication List    STOP taking these medications   apixaban 5 MG Tabs tablet Commonly known as: Eliquis   dronabinol 2.5 MG capsule Commonly known as: MARINOL   Lenvatinib 12 mg daily dose 3 x 4 MG capsule Commonly known as: [place] these medications   albuterol 108 (90 Base) MCG/ACT inhaler Commonly known as: VENTOLIN HFA Take 90 mcg by mouth every 4 (four) hours as needed.   docusate sodium 100 MG capsule Commonly known as: COLACE Take 1 capsule (100 mg total) by mouth 2 (two) times daily as needed for mild constipation.   gabapentin 300 MG capsule Commonly known as: NEURONTIN Take 1 capsule (300 mg total) by mouth 3 (three) times daily.   ondansetron 8 MG tablet Commonly known as: ZOFRAN Take 1 tablet (8 mg total) by mouth every 8 (eight) hours as  needed for nausea or vomiting.   Oxycodone HCl 10 MG Tabs Take 1 tablet (10 mg total) by mouth every 6 (six) hours as needed. OK to refill on 05/18/2020   polyethylene glycol 17 g packet Commonly known as: MIRALAX / GLYCOLAX Take 17 g by mouth daily as needed for mild constipation.   prochlorperazine 10 MG tablet Commonly known as: COMPAZINE Take 1 tablet (10 mg total) by mouth every 6 (six) hours as needed for nausea or vomiting.   Xtampza ER 13.5 MG C12a Generic drug: oxyCODONE ER Take 13.5 mg by mouth every 12 (twelve) hours.            Durable Medical Equipment  (From admission, onward)         Start     Ordered   07/30/20 1420  DME Walker  Once        Question Answer Comment  Walker: With 5 Inch Wheels   Patient needs a walker to treat with the following condition Muscle weakness (generalized)      07/30/20 1433          Follow-up [facility] Follow up.   Why: Patient had IVC filter placed. Needs follow up appointment to discuss possible retrieval. A scheduler from our office will call you to arrange this at the appropriate time.  Contact information: [place] [address] [phone]              Allergies  Allergen Reactions   Ms Contin Cleone Slim Sulfate Er] Nausea And Vomiting      The results of significant diagnostics from this hospitalization (including imaging, microbiology, ancillary and laboratory) are listed below for reference.    Labs: BNP (last 3 results) Recent Labs    07/28/20 1006  BNP 202.5*   Basic Metabolic Panel: Recent Labs  Lab 07/25/20 0207 07/25/20 0207 07/26/20 0257 07/27/20 0256 07/28/20 0054 07/29/20 0648 07/30/20 0551  NA 137   < > 138 137 138 137 137  K 4.2   < > 4.9 4.3 4.0 3.7 4.2  CL 104   < > 105 107 108 105 103  CO2 21*   < > 21* 20* 20* 22 23  GLUCOSE 82   < > 108* 100* 103* 87 102*  BUN 24*   < > 32* 32* _0 CREATININE 1.75*   < > 2.21* 1.75* 1.24 0.92 0.90  CALCIUM 9.0   < > 8.8* 8.5* 8.6* 9.3 9.3  MG 1.5*  --  2.6*  --   --   --   --   PHOS 3.4  --   --   --   --   --   --    < > = values in this interval not displayed.   Liver Function Tests: Recent Labs  Lab 07/26/20 0257 07/27/20 0734 07/28/20 0054 07/29/20 0648 07/30/20 0551  AST 252* 343* 367* 239* 189*  ALT 96* 143* 166* 131* 108*  ALKPHOS 230* 244* 269* 257* 249*  BILITOT 2.7* 2.1* 2.2* 3.1* 2.9*  PROT 6.6 6.6 6.5 7.2 7.6  ALBUMIN 2.7* 2.8* 2.7* 2.7* 2.9*   Recent Labs  Lab 07/24/20 0709  LIPASE 39   No results for input(s): AMMONIA in the last 168 hours. CBC: Recent Labs  Lab 07/24/20 0709 07/24/20 4270 07/26/20 0257  07/26/20 1107 07/27/20 0256 07/27/20 0734 07/27/20 2133 07/28/20 0054 07/28/20 0611 07/29/20 0648 07/30/20 0551  WBC 9.5   < >  7.7  --  7.2  --  7.0  --   --  6.7 7.1  NEUTROABS 6.9  --   --   --   --   --  5.2  --   --   --   --   HGB 8.2*   < > 6.5*   < > 6.8*   < > 9.2* 9.2* 8.9* 10.2* 10.9*  HCT 26.9*   < > 20.5*   < > 20.7*   < > 28.0* 28.0* 27.8* 31.8* 34.0*  MCV 96.8   < > 94.5  --  90.0  --  90.6  --   --  93.0 94.2  PLT 260   < > 210  --  209  --  201  --   --  235 256   < > = values in this interval not displayed.   Cardiac Enzymes: No results for input(s): CKTOTAL, CKMB, CKMBINDEX, TROPONINI in the last 168 hours. BNP: Invalid input(s): POCBNP CBG: Recent Labs  Lab 07/25/20 1726  GLUCAP 108*   D-Dimer No results for input(s): DDIMER in the last 72 hours. Hgb A1c No results for input(s): HGBA1C in the last 72 hours. Lipid Profile No results for input(s): CHOL, HDL, LDLCALC, TRIG, CHOLHDL, LDLDIRECT in the last 72 hours. Thyroid function studies No results for input(s): TSH, T4TOTAL, T3FREE, THYROIDAB in the last 72 hours.  Invalid input(s): FREET3 Anemia work up No results for input(s): VITAMINB12, FOLATE, FERRITIN, TIBC, IRON, RETICCTPCT in the last 72 hours. Urinalysis    Component Value Date/Time   COLORURINE YELLOW (A) 05/25/2018 1119   APPEARANCEUR CLEAR (A) 05/25/2018 1119   LABSPEC 1.016 05/25/2018 1119   PHURINE 5.0 05/25/2018 1119   GLUCOSEU NEGATIVE 05/25/2018 1119   HGBUR NEGATIVE 05/25/2018 1119   BILIRUBINUR NEGATIVE 05/25/2018 1119   KETONESUR NEGATIVE 05/25/2018 1119   PROTEINUR NEGATIVE 05/25/2018 1119   NITRITE NEGATIVE 05/25/2018 1119   LEUKOCYTESUR NEGATIVE 05/25/2018 1119   Sepsis Labs Invalid input(s): PROCALCITONIN,  WBC,  LACTICIDVEN Microbiology Recent Results (from the past 240 hour(s))  Blood culture (routine x 2)     Status: None   Collection Time: 07/24/20  8:10 AM   Specimen: BLOOD RIGHT HAND  Result Value Ref Range  Status   Specimen Description   Final    BLOOD RIGHT HAND Performed at [facility], [place] [address], [place], [place] [address]    Special Requests   Final    BOTTLES DRAWN AEROBIC AND ANAEROBIC Blood Culture results may not be optimal due to an inadequate volume of blood received in culture bottles Performed at [place] [address], [place], [place] [address]    Culture   Final    NO GROWTH 5 DAYS Performed at [facility], [place] [address] [place], [place] [address]    Report Status 07/29/2020 FINAL  Final  Blood culture (routine x 2)     Status: None   Collection Time: 07/24/20  8:15 AM   Specimen: BLOOD  Result Value Ref Range Status   Specimen Description   Final    BLOOD LEFT WRIST Performed at [facility] [address] [place], [place] [address]    Special Requests   Final    BOTTLES DRAWN AEROBIC ONLY Blood Culture adequate volume Performed at [place] [address], [place], [place] [address]    Culture   Final    NO GROWTH 5 DAYS  Performed at [facility], [place] [address], [place], [place] [address]    Report Status 07/29/2020 FINAL  Final  SARS Coronavirus 2 by RT PCR (hospital order, performed in [facility] hospital lab) Nasopharyngeal Nasopharyngeal Swab     Status: None   Collection Time: 07/24/20  8:17 AM   Specimen: Nasopharyngeal Swab  Result Value Ref Range Status   SARS Coronavirus 2 NEGATIVE NEGATIVE Final    Comment: (NOTE) SARS-CoV-2 target nucleic acids are NOT DETECTED.  The SARS-CoV-2 RNA is generally detectable in upper and lower respiratory specimens during the acute phase of infection. The lowest concentration of SARS-CoV-2 viral copies this assay can detect is 250 copies / mL. A negative result does not preclude SARS-CoV-2 infection and should not be used as the sole basis for treatment or other patient management decisions.  A negative  result may occur with improper specimen collection / handling, submission of specimen other than nasopharyngeal swab, presence of viral mutation(s) within the areas targeted by this assay, and inadequate number of viral copies (<250 copies / mL). A negative result must be combined with clinical observations, patient history, and epidemiological information.  Fact Sheet for Patients:   StrictlyIdeas.no  Fact Sheet for Healthcare Providers: BankingDealers.co.za  This test is not yet approved or  cleared by the [place] FDA and has been authorized for detection and/or diagnosis of SARS-CoV-2 by FDA under an Emergency Use Authorization (EUA).  This EUA will remain in effect (meaning this test can be used) for the duration of the COVID-19 declaration under Section 564(b)(1) of the Act, 21 U.S.C. section 360bbb-3(b)(1), unless the authorization is terminated or revoked sooner.  Performed at [facility], [place] [address], [place], [place] [address]   MRSA PCR Screening     Status: None   Collection Time: 07/24/20  5:19 PM   Specimen: Nasopharyngeal  Result Value Ref Range Status   MRSA by PCR NEGATIVE NEGATIVE Final    Comment:        The GeneXpert MRSA Assay (FDA approved for NASAL specimens only), is one component of a comprehensive MRSA colonization surveillance program. It is not intended to diagnose MRSA infection nor to guide or monitor treatment for MRSA infections. Performed at [facility], [place] [address], [place], [place] [address]     Procedures/Studies: CT ABDOMEN PELVIS WO CONTRAST  Addendum Date: 07/26/2020   ADDENDUM REPORT: 07/26/2020 12:31 ADDENDUM: Critical Value/emergent results were called by telephone at the time of interpretation on 07/26/2020 at 1226 hours to Dr. Nira Conn RAI , who verbally acknowledged these results. Electronically Signed   By: Genevie Ann M.D.   On:  07/26/2020 12:31   Result Date: 07/26/2020 CLINICAL DATA:  59 year old male with left upper quadrant abdominal pain. Metastatic hepatocellular carcinoma. On Eliquis for pulmonary embolus, status post abdominal/retroperitoneal hemorrhage. Hemoglobin continues to trend down, query worsening hematoma. EXAM: CT ABDOMEN AND PELVIS WITHOUT CONTRAST TECHNIQUE: Multidetector CT imaging of the abdomen and pelvis was performed following the standard protocol without IV contrast. COMPARISON:  CT Abdomen and Pelvis 07/24/2020 without contrast. CT Abdomen and Pelvis with contrast 07/18/2020. FINDINGS: Lower chest: Stable lung bases with bronchiectasis, fibrosis and scarring. No pericardial or pleural effusion. Hepatobiliary: Increased perihepatic complex fluid compatible with hemoperitoneum. Additional details are in the Other section below. Nodular liver contour related to infiltrative tumor in both the right and left hepatic lobes again noted. And there is more hyperdense hematoma located along the undersurface of the liver a  especially at the left lobe and porta hepatis as seen on coronal image 30 today. Stable noncontrast gallbladder. Pancreas: Stable pancreatic atrophy. Spleen: Increased, moderate complex free fluid about the spleen compatible with hemoperitoneum. Layering hematocrit is evident on series 2, image 29. Stable small splenic size. Adrenals/Urinary Tract: Negative noncontrast appearance. Stomach/Bowel: No free air. No dilated large or small bowel. Some bowel loops are difficult to delineate from the moderate volume of hemoperitoneum, further detailed below. The stomach is decompressed. Vascular/Lymphatic: Vascular patency is not evaluated in the absence of IV contrast. Aortoiliac calcified atherosclerosis. Stable dense calcification in the deep pelvis which might be chronically calcified lymph node. No abdominal or pelvic lymphadenopathy. Reproductive: Negative. Other: Complex free fluid in the pelvis in the  and abdominal peritoneal cavity has increased since 07/24/2020. See series 2, image 53 today compared to series 2, image 45 previously, series 2, image 75 today compared to series 2, image 68 previously). The volume is now moderate. Musculoskeletal: Lytic lesions of the right T9 vertebral posterior elements, T12 vertebral body, right L4 vertebral body and pedicle (with stable pathologic fracture as well as extraosseous and epidural extension - see series 2, image 57)), left sacral ala. Stable visualized osseous structures. IMPRESSION: 1. Increased and now moderate volume Hemoperitoneum throughout the abdomen and pelvis. Given hyperdense clot along the undersurface of the liver, favor bleeding hepatic tumor as the source of hemorrhage. 2. Grossly stable infiltrative liver tumor and complicated spinal metastatic disease since 07/24/2020. 3. Lung base Bronchiectasis and scarring. Aortic Atherosclerosis (ICD10-I70.0). Electronically Signed: By: Genevie Ann M.D. On: 07/26/2020 12:24   CT ABDOMEN PELVIS WO CONTRAST  Result Date: 07/24/2020 CLINICAL DATA:  Acute generalized abdominal pain and distention. History of hepatocellular carcinoma. EXAM: CT ABDOMEN AND PELVIS WITHOUT CONTRAST TECHNIQUE: Multidetector CT imaging of the abdomen and pelvis was performed following the standard protocol without IV contrast. COMPARISON:  July 18, 2020. FINDINGS: Lower chest: Mild bibasilar subsegmental atelectasis is noted. Hepatobiliary: No gallstones or biliary dilatation is noted. Multiple rounded hypoechoic lesions are noted throughout the liver consistent with metastatic disease. Pancreas: Unremarkable. No pancreatic ductal dilatation or surrounding inflammatory changes. Spleen: Normal in size without focal abnormality. Adrenals/Urinary Tract: Adrenal glands are unremarkable. Kidneys are normal, without renal calculi, focal lesion, or hydronephrosis. Bladder is unremarkable. Stomach/Bowel: The stomach is unremarkable. There is no  evidence of bowel obstruction or inflammation the appendix appears normal. Vascular/Lymphatic: Aortic atherosclerosis. No enlarged abdominal or pelvic lymph nodes. Reproductive: Prostate is unremarkable. Other: There is the interval development of high density fluid in the pelvis most consistent with hemorrhage. There also appears to be complex hyperdense abnormality seen anteriorly in the central portion of the abdomen, also consistent with intraperitoneal hematoma or hemorrhage. No hernia is noted. Hemorrhage is also noted inferior to the right hepatic lobe. Musculoskeletal: Large lytic destructive lesions are noted in the left sacrum, L1 and L4 vertebral bodies. The L4 lesion appears to extend into the right paraspinal soft tissues. IMPRESSION: 1. Interval development of high density fluid in the pelvis most consistent with hemorrhage. There also appears to be complex hyperdense abnormality seen anteriorly in the central portion of the abdomen, also consistent with intraperitoneal hematoma or hemorrhage. Hemorrhage is also noted inferior to the right hepatic lobe. Critical Value/emergent results were called by telephone at the time of interpretation on 07/24/2020 at 8:46 am to provider ADAM CURATOLO , who verbally acknowledged these results. 2. Multiple rounded hypoechoic lesions are noted throughout the liver consistent with metastatic disease. 3. Large  lytic destructive lesions are noted in the left sacrum, L1 and L4 vertebral bodies consistent with metastatic disease. The L4 lesion appears to extend into the right paraspinal soft tissues. 4. Aortic atherosclerosis. Aortic Atherosclerosis (ICD10-I70.0). Electronically Signed   By: Marijo Conception M.D.   On: 07/24/2020 08:46   DG Chest 2 View  Result Date: 07/19/2020 CLINICAL DATA:  59 year old male with shortness of breath. EXAM: CHEST - 2 VIEW COMPARISON:  Chest radiograph dated 07/19/2020. FINDINGS: No focal consolidation, pleural effusion, or  pneumothorax. The cardiac silhouette is within limits. Atherosclerotic calcification of the aorta. No acute osseous pathology. Degenerative changes of the spine and bilateral AC joints. IMPRESSION: No active cardiopulmonary disease. Electronically Signed   By: Anner Crete M.D.   On: 07/19/2020 21:59   DG Chest 2 View  Result Date: 07/19/2020 CLINICAL DATA:  Shortness of breath EXAM: CHEST - 2 VIEW COMPARISON:  Chest CT 04/24/2020 FINDINGS: Normal heart size and mediastinal contours. No acute infiltrate or edema. No effusion or pneumothorax. Known osseous metastatic disease by CT. No acute osseous findings. IMPRESSION: No acute finding. Electronically Signed   By: Monte Fantasia M.D.   On: 07/19/2020 07:25   CT Angio Chest PE W and/or Wo Contrast  Result Date: 07/19/2020 CLINICAL DATA:  Shortness of breath. Possible PE seen on CT 1 day ago. Stage IV multifocal hepatocellular carcinoma. EXAM: CT ANGIOGRAPHY CHEST WITH CONTRAST TECHNIQUE: Multidetector CT imaging of the chest was performed using the standard protocol during bolus administration of intravenous contrast. Multiplanar CT image reconstructions and MIPs were obtained to evaluate the vascular anatomy. CONTRAST:  3m OMNIPAQUE IOHEXOL 350 MG/ML SOLN COMPARISON:  CT abdomen-pelvis 07/18/2020.  CT chest 04/24/2020 FINDINGS: Cardiovascular: Satisfactory opacification of the pulmonary arteries to the segmental level. Chronic nonocclusive filling defect within the lobar and segmental branches of the right lower lobe pulmonary artery (series 4, images 50-52). The thrombus burden has decreased compared to prior CT 04/24/2020. No additional filling defects are identified. No central PE. Normal RV to LV ratio without evidence of right heart strain. Thoracic aorta is nonaneurysmal. Atherosclerotic calcification of the aortic arch. Normal heart size. No pericardial effusion. Mediastinum/Nodes: No enlarged mediastinal, hilar, or axillary lymph nodes.  Thyroid gland, trachea, and esophagus demonstrate no significant findings. Lungs/Pleura: Similar mild-moderate emphysematous lung changes. No focal airspace consolidation. No pulmonary nodules or masses. No pleural effusion or pneumothorax. Upper Abdomen: Numerous confluent heterogeneous enhancing liver masses, as detailed on CT abdomen pelvis 07/18/2020. No new or acute findings within the visualized portion of the upper abdomen. Musculoskeletal: Lytic metastatic lesion within the left portion of the T12 vertebral body. Expansile lytic lesion centered within the right T9 transverse process (series 4, image 61). No definite new areas of osseous metastasis are identified. No acute fracture. Review of the MIP images confirms the above findings. IMPRESSION: 1. Chronic nonocclusive filling defect within the lobar and segmental branches of the right lower lobe pulmonary artery. The thrombus burden has decreased compared to prior CT 04/24/2020. No additional filling defects are identified. No evidence of right heart strain. 2. Lungs are clear. 3. Osseous metastatic disease as described above. 4. Numerous confluent heterogeneous enhancing liver masses, as detailed on CT abdomen pelvis 07/18/2020. Aortic Atherosclerosis (ICD10-I70.0) and Emphysema (ICD10-J43.9). Electronically Signed   By: NDavina PokeD.O.   On: 07/19/2020 09:06   NM Bone Scan Whole Body  Result Date: 07/18/2020 CLINICAL DATA:  Hepatocellular carcinoma.  Known bony metastasis EXAM: NUCLEAR MEDICINE WHOLE BODY BONE SCAN TECHNIQUE: Whole  body anterior and posterior images were obtained approximately 3 hours after intravenous injection of radiopharmaceutical. RADIOPHARMACEUTICALS:  19.6 mCi Technetium-25mMDP IV COMPARISON:  Bone scan 05/20/2020 FINDINGS: Physiologic distribution of radiotracer with activity in the bilateral kidneys and urinary bladder. Faint uptake is seen within the right T9 pedicle. Faint uptake is also seen within the T12  vertebral body. No appreciable radiotracer accumulation within the L4 vertebrae or left hemi sacrum at sites of known metastatic disease. Similar distribution of degenerative uptake including within the bilateral shoulders and knees. IMPRESSION: 1. Faint uptake within the T9 and T12 vertebrae at sites of known metastatic disease. 2. No appreciable focal uptake is seen within the L4 vertebrae or left hemi sacrum at sites of known osseous metastatic disease. These lesions are more evident on CT. Electronically Signed   By: NDavina PokeD.O.   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 History of palliative radiation at T9, T10 and L4. Mild going medical therapy. Restaging. EXAM: CT ABDOMEN AND PELVIS WITH CONTRAST TECHNIQUE: Multidetector CT imaging of the abdomen and pelvis was performed using the standard protocol following bolus administration of intravenous contrast. CONTRAST:  1036mOMNIPAQUE IOHEXOL 300 MG/ML  SOLN COMPARISON:  04/24/2020 CT chest, abdomen and pelvis. FINDINGS: Lower chest: Centrilobular emphysema at the lung bases. Low signal filling defect centrally in the right lower lobe pulmonary artery branch on the uppermost slice (series 2/image 1), potentially acute pulmonary embolus. Hepatobiliary: Numerous bulky confluent heterogeneous hyperenhancing liver masses throughout the liver, increased since 04/24/2020 CT. Representative liver masses as follows: -segment 3 left liver lobe 4.6 x 4.4 cm mass (series 2/image 51), previously 4.3 x 3.7 cm -segment 2 left liver lobe 2.7 x 2.2 cm mass (series 2/image 31), previously 2.0 x 1.5 cm -segment 5 right liver 3.6 x 3.2 cm mass (series 2/image 62), previously 3.0 x 2.5 cm Normal gallbladder with no radiopaque cholelithiasis. No biliary ductal dilatation. Pancreas: Normal, with no mass or duct dilation. Spleen: Normal size. No mass.  Adrenals/Urinary Tract: Normal adrenals. Normal kidneys with no hydronephrosis and no renal mass. Normal bladder. Stomach/Bowel: Normal non-distended stomach. Normal caliber small bowel with no small bowel wall thickening. Normal appendix. Normal large bowel with no diverticulosis, large bowel wall thickening or pericolonic fat stranding. Vascular/Lymphatic: Atherosclerotic nonaneurysmal abdominal aorta. Patent portal, splenic, hepatic and renal veins. No pathologically enlarged lymph nodes in the abdomen or pelvis. Reproductive: Normal size prostate. Other: No pneumoperitoneum, ascites or focal fluid collection. Musculoskeletal: Multiple lytic bone metastases scattered in the visualized spine and sacrum, increased. Representative expansile right L4 vertebral 6.4 x 5.7 cm lesion (series 12/image 48), increased from 6.0 x 4.9 cm. Representative left upper sacral 3.4 x 3.0 cm lesion (series 12/image 63), not previously imaged. Represent of left T12 2.9 x 2.8 cm lesion (series 12/image 23), increased from 2.4 x 2.3 cm. Marked lumbar spondylosis. IMPRESSION: 1. Probable acute pulmonary embolus partially visualized at the right lung base. Further evaluation with PE protocol CT chest angiogram may be obtained as clinically warranted. 2. Widespread liver metastases, increased. 3. Multiple lytic bone metastases, increased. 4. Aortic Atherosclerosis (ICD10-I70.0) and Emphysema (ICD10-J43.9). Critical Value/emergent results were called by telephone at the time of interpretation on 07/18/2020 at 12:54 pm to provider YATruitt Merle who verbally acknowledged these results. Electronically Signed   By: JaIlona Sorrel.D.   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 Despite acute renal dysfunction, angiogram is indicated for life-saving embolization. IVC filter is also indicated given the  patient's thromboembolic disease, and inability to anticoagulate. EXAM: ULTRASOUND-GUIDED ACCESS RIGHT COMMON FEMORAL ARTERY ULTRASOUND GUIDED ACCESS RIGHT COMMON FEMORAL VEIN MESENTERIC ANGIOGRAM EMBOLIZATION OF HEMORRHAGIC LEFT HEPATIC [place] RETRIEVABLE IVC FILTER PLACEMENT. EXOSEAL DEPLOYED FOR HEMOSTASIS MEDICATIONS: 4 mg Zofran. The antibiotic was administered within 1 hour of the procedure ANESTHESIA/SEDATION: Moderate (conscious) sedation was employed during this procedure. A total of Versed 2.0 mg and Fentanyl 100 mcg was administered intravenously. Moderate Sedation Time: 60 minutes. The patient's level of consciousness and vital signs were monitored continuously by radiology nursing throughout the procedure under my direct supervision. CONTRAST:  46 cc FLUOROSCOPY TIME:  Fluoroscopy Time: 5 minutes 0 seconds COMPLICATIONS: None PROCEDURE: Informed consent was obtained from the patient following explanation of the procedure, risks, benefits and alternatives, regarding angiogram and embolization. Specifically, the chance of contrast induced nephropathy was reviewed. The patient understands, agrees and consents for the procedure. All questions were addressed. A time out was performed prior to the initiation of the procedure. Maximal barrier sterile technique utilized including caps, mask, sterile gowns, sterile gloves, large sterile drape, hand hygiene, and Betadine prep. The procedure, risks, benefits, and alternatives of IVC filter placement were explained to the patient. Specific risks discussed include bleeding, infection, contrast reaction, renal failure, IVC filter fracture, migration, iliocaval thrombus (3-4% incidence), need for further procedure, need for further surgery, pulmonary embolism, cardiopulmonary collapse, death. Questions regarding the procedure were encouraged and answered. The patient understands and consents to the procedure. Ultrasound survey of the right inguinal region was performed  with images stored and sent to PACs, confirming patency of the vessel. A micropuncture needle was used access the right common femoral artery under ultrasound. With excellent arterial blood flow returned, and an .018 micro wire was passed through the needle, observed enter the abdominal aorta under fluoroscopy. The needle was removed, and a micropuncture sheath was placed over the wire. The inner dilator and wire were removed, and an 035 Bentson wire was advanced under fluoroscopy into the abdominal aorta. The sheath was removed and a standard 5 [place] vascular sheath was placed. The dilator was removed and the sheath was flushed. Mickelson catheter was advanced on the Bentson wire. Catheter recur for was formed in the thoracic aorta. Catheter was withdrawn to the abdominal aorta and the wire was removed. Catheter was used to select the celiac artery. Angiogram was performed. High-flow catheter and a 14 fathom wire were advanced into the left gastric artery. Wire and the catheter were advanced into the replaced left hepatic artery. Angiogram was performed. Catheter was advanced to the division of segment 2 and segment 3. Angiogram was performed to confirm location. Embolization was then performed with a single vial of 500-700 micron embosphere to stasis. Repeat angiogram was performed. Microcatheter and the catheter were then removed. Ultrasound survey was performed of the common femoral vein with images stored and sent to PACs. Local anesthesia was provided with 1% Lidocaine. A single wall needle was used access the right common femoral vein under ultrasound. With excellent blood flow returned, a Bentson wire was passed through the needle, observed to enter the IVC under fluoroscopy. The needle was removed, and 10 [place] dilator was passed. The delivery sheath for a retrievable Bard Denali filter was passed over the Bentson wire into the IVC. The wire  was removed and IVC cavagram performed. Dilator was removed, and  the IVC filter was then delivered, positioned below the lowest renal vein. Repeat cavagram was not performed. The delivery sheath was removed. Manual pressure was used for hemostasis. Exoseal was deployed at this point at the common femoral artery access point. Patient tolerated the procedure well and remained hemodynamically stable throughout. No complications were encountered and no significant blood loss was encounter. FINDINGS: Ultrasound demonstrates patent common femoral vein and patent common femoral artery on the right. Angiogram demonstrates the expected arrangement of the celiac artery, with patent common hepatic artery, splenic artery, and left gastric artery. There is a replaced left hepatic artery from the left gastric artery. 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 A sentinel clot was identified on the prior CT. Embolization was performed with 500-700 micron embospheres to stasis. After embolization was performed, retrievable IVC filter was placed below the lowest renal vein. This was deployed at the L1-L2 level. IMPRESSION: Status post ultrasound guided access right common femoral artery for hepatic angiogram and bland embolization of hemorrhagic left HCC to stasis, with Exoseal deployed for hemostasis at the common femoral artery puncture site. Status post ultrasound guided access right common femoral vein for deployment of retrievable IVC filter. Signed, Dulcy Fanny. Dellia Nims, RPVI [facility] Radiology Electronically Signed   By: Corrie Mckusick D.O.   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 Despite acute renal dysfunction, angiogram is indicated for life-saving embolization. IVC  filter is also indicated given the patient's thromboembolic disease, and inability to anticoagulate. EXAM: ULTRASOUND-GUIDED ACCESS RIGHT COMMON FEMORAL ARTERY ULTRASOUND GUIDED ACCESS RIGHT COMMON FEMORAL VEIN MESENTERIC ANGIOGRAM EMBOLIZATION OF HEMORRHAGIC LEFT HEPATIC [place] RETRIEVABLE IVC FILTER PLACEMENT. EXOSEAL DEPLOYED FOR HEMOSTASIS MEDICATIONS: 4 mg Zofran. The antibiotic was administered within 1 hour of the procedure ANESTHESIA/SEDATION: Moderate (conscious) sedation was employed during this procedure. A total of Versed 2.0 mg and Fentanyl 100 mcg was administered intravenously. Moderate Sedation Time: 60 minutes. The patient's level of consciousness and vital signs were monitored continuously by radiology nursing throughout the procedure under my direct supervision. CONTRAST:  46 cc FLUOROSCOPY TIME:  Fluoroscopy Time: 5 minutes 0 seconds COMPLICATIONS: None PROCEDURE: Informed consent was obtained from the patient following explanation of the procedure, risks, benefits and alternatives, regarding angiogram and embolization. Specifically, the chance of contrast induced nephropathy was reviewed. The patient understands, agrees and consents for the procedure. All questions were addressed. A time out was performed prior to the initiation of the procedure. Maximal barrier sterile technique utilized including caps, mask, sterile gowns, sterile gloves, large sterile drape, hand hygiene, and Betadine prep. The procedure, risks, benefits, and alternatives of IVC filter placement were explained to the patient. Specific risks discussed include bleeding, infection, contrast reaction, renal failure, IVC filter fracture, migration, iliocaval thrombus (3-4% incidence), need for further procedure, need for further surgery, pulmonary embolism, cardiopulmonary collapse, death. Questions regarding the procedure were encouraged and answered. The patient understands and consents to the procedure. Ultrasound survey of the  right inguinal region was performed with images stored and sent to PACs, confirming patency of the vessel. A micropuncture needle was used access the right common femoral artery under ultrasound. With excellent arterial blood flow returned, and an .018 micro wire  was passed through the needle, observed enter the abdominal aorta under fluoroscopy. The needle was removed, and a micropuncture sheath was placed over the wire. The inner dilator and wire were removed, and an 035 Bentson wire was advanced under fluoroscopy into the abdominal aorta. The sheath was removed and a standard 5 [place] vascular sheath was placed. The dilator was removed and the sheath was flushed. Mickelson catheter was advanced on the Bentson wire. Catheter recur for was formed in the thoracic aorta. Catheter was withdrawn to the abdominal aorta and the wire was removed. Catheter was used to select the celiac artery. Angiogram was performed. High-flow catheter and a 14 fathom wire were advanced into the left gastric artery. Wire and the catheter were advanced into the replaced left hepatic artery. Angiogram was performed. Catheter was advanced to the division of segment 2 and segment 3. Angiogram was performed to confirm location. Embolization was then performed with a single vial of 500-700 micron embosphere to stasis. Repeat angiogram was performed. Microcatheter and the catheter were then removed. Ultrasound survey was performed of the common femoral vein with images stored and sent to PACs. Local anesthesia was provided with 1% Lidocaine. A single wall needle was used access the right common femoral vein under ultrasound. With excellent blood flow returned, a Bentson wire was passed through the needle, observed to enter the IVC under fluoroscopy. The needle was removed, and 10 [place] dilator was passed. The delivery sheath for a retrievable Bard Denali filter was passed over the Bentson wire into the IVC. The wire was removed and IVC cavagram  performed. Dilator was removed, and the IVC filter was then delivered, positioned below the lowest renal vein. Repeat cavagram was not performed. The delivery sheath was removed. Manual pressure was used for hemostasis. Exoseal was deployed at this point at the common femoral artery access point. Patient tolerated the procedure well and remained hemodynamically stable throughout. No complications were encountered and no significant blood loss was encounter. FINDINGS: Ultrasound demonstrates patent common femoral vein and patent common femoral artery on the right. Angiogram demonstrates the expected arrangement of the celiac artery, with patent common hepatic artery, splenic artery, and left gastric artery. There is a replaced left hepatic artery from the left gastric artery. 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 A sentinel clot was identified on the prior CT. Embolization was performed with 500-700 micron embospheres to stasis. After embolization was performed, retrievable IVC filter was placed below the lowest renal vein. This was deployed at the L1-L2 level. IMPRESSION: Status post ultrasound guided access right common femoral artery for hepatic angiogram and bland embolization of hemorrhagic left HCC to stasis, with Exoseal deployed for hemostasis at the common femoral artery puncture site. Status post ultrasound guided access right common femoral vein for deployment of retrievable IVC filter. Signed, Dulcy Fanny. Dellia Nims, RPVI [facility] Radiology Electronically Signed   By: Corrie Mckusick D.O.   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 Despite acute renal dysfunction, angiogram is indicated for  life-saving embolization. 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 EXAM: ULTRASOUND-GUIDED ACCESS RIGHT COMMON FEMORAL ARTERY ULTRASOUND GUIDED ACCESS RIGHT COMMON FEMORAL VEIN MESENTERIC ANGIOGRAM EMBOLIZATION OF HEMORRHAGIC LEFT HEPATIC  [place] RETRIEVABLE IVC FILTER PLACEMENT. EXOSEAL DEPLOYED FOR HEMOSTASIS MEDICATIONS: 4 mg Zofran. The antibiotic was administered within 1 hour of the procedure ANESTHESIA/SEDATION: Moderate (conscious) sedation was employed during this procedure. A total of Versed 2.0 mg and Fentanyl 100 mcg was administered intravenously. Moderate Sedation Time: 60 minutes. The patient's level of consciousness and vital signs were monitored continuously by radiology nursing throughout the procedure under my direct supervision. CONTRAST:  46 cc FLUOROSCOPY TIME:  Fluoroscopy Time: 5 minutes 0 seconds COMPLICATIONS: None PROCEDURE: Informed consent was obtained from the patient following explanation of the procedure, risks, benefits and alternatives, regarding angiogram and embolization. Specifically, the chance of contrast induced nephropathy was reviewed. The patient understands, agrees and consents for the procedure. All questions were addressed. A time out was performed prior to the initiation of the procedure. Maximal barrier sterile technique utilized including caps, mask, sterile gowns, sterile gloves, large sterile drape, hand hygiene, and Betadine prep. The procedure, risks, benefits, and alternatives of IVC filter placement were explained to the patient. Specific risks discussed include bleeding, infection, contrast reaction, renal failure, IVC filter fracture, migration, iliocaval thrombus (3-4% incidence), need for further procedure, need for further surgery, pulmonary embolism, cardiopulmonary collapse, death. Questions regarding the procedure were encouraged and answered. The patient understands and consents to the  procedure. Ultrasound survey of the right inguinal region was performed with images stored and sent to PACs, confirming patency of the vessel. A micropuncture needle was used access the right common femoral artery under ultrasound. With excellent arterial blood flow returned, and an .018 micro wire was passed through the needle, observed enter the abdominal aorta under fluoroscopy. The needle was removed, and a micropuncture sheath was placed over the wire. The inner dilator and wire were removed, and an 035 Bentson wire was advanced under fluoroscopy into the abdominal aorta. The sheath was removed and a standard 5 [place] vascular sheath was placed. The dilator was removed and the sheath was flushed. Mickelson catheter was advanced on the Bentson wire. Catheter recur for was formed in the thoracic aorta. Catheter was withdrawn to the abdominal aorta and the wire was removed. Catheter was used to select the celiac artery. Angiogram was performed. High-flow catheter and a 14 fathom wire were advanced into the left gastric artery. Wire and the catheter were advanced into the replaced left hepatic artery. Angiogram was performed. Catheter was advanced to the division of segment 2 and segment 3. Angiogram was performed to confirm location. Embolization was then performed with a single vial of 500-700 micron embosphere to stasis. Repeat angiogram was performed. Microcatheter and the catheter were then removed. Ultrasound survey was performed of the common femoral vein with images stored and sent to PACs. Local anesthesia was provided with 1% Lidocaine. A single wall needle was used access the right common femoral vein under ultrasound. With excellent blood flow returned, a Bentson wire was passed through the needle, observed to enter the IVC under fluoroscopy. The needle was removed, and 10 [place] dilator was passed. The delivery sheath for a retrievable Bard Denali filter was passed over the Bentson wire into the IVC.  The wire was removed and IVC cavagram performed. Dilator was removed, and the IVC filter was then delivered, positioned below the lowest renal vein. Repeat cavagram was not performed. The delivery sheath was removed. Manual pressure was used for hemostasis. Exoseal was deployed at this point at the common femoral artery access point. Patient tolerated the procedure well and remained hemodynamically stable throughout. No complications were  encountered and no significant blood loss was encounter. FINDINGS: Ultrasound demonstrates patent common femoral vein and patent common femoral artery on the right. Angiogram demonstrates the expected arrangement of the celiac artery, with patent common hepatic artery, splenic artery, and left gastric artery. There is a replaced left hepatic artery from the left gastric artery. 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 A sentinel clot was identified on the prior CT. Embolization was performed with 500-700 micron embospheres to stasis. After embolization was performed, retrievable IVC filter was placed below the lowest renal vein. This was deployed at the L1-L2 level. IMPRESSION: Status post ultrasound guided access right common femoral artery for hepatic angiogram and bland embolization of hemorrhagic left HCC to stasis, with Exoseal deployed for hemostasis at the common femoral artery puncture site. Status post ultrasound guided access right common femoral vein for deployment of retrievable IVC filter. Signed, Dulcy Fanny. Dellia Nims, RPVI [facility] Radiology Electronically Signed   By: Corrie Mckusick D.O.   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 Despite acute renal dysfunction,  angiogram is indicated for life-saving embolization. 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 EXAM: ULTRASOUND-GUIDED ACCESS RIGHT COMMON FEMORAL ARTERY ULTRASOUND GUIDED ACCESS RIGHT COMMON FEMORAL VEIN MESENTERIC ANGIOGRAM EMBOLIZATION OF HEMORRHAGIC LEFT HEPATIC [place] RETRIEVABLE IVC FILTER PLACEMENT. EXOSEAL DEPLOYED FOR HEMOSTASIS MEDICATIONS: 4 mg Zofran. The antibiotic was administered within 1 hour of the procedure ANESTHESIA/SEDATION: Moderate (conscious) sedation was employed during this procedure. A total of Versed 2.0 mg and Fentanyl 100 mcg was administered intravenously. Moderate Sedation Time: 60 minutes. The patient's level of consciousness and vital signs were monitored continuously by radiology nursing throughout the procedure under my direct supervision. CONTRAST:  46 cc FLUOROSCOPY TIME:  Fluoroscopy Time: 5 minutes 0 seconds COMPLICATIONS: None PROCEDURE: Informed consent was obtained from the patient following explanation of the procedure, risks, benefits and alternatives, regarding angiogram and embolization. Specifically, the chance of contrast induced nephropathy was reviewed. The patient understands, agrees and consents for the procedure. All questions were addressed. A time out was performed prior to the initiation of the procedure. Maximal barrier sterile technique utilized including caps, mask, sterile gowns, sterile gloves, large sterile drape, hand hygiene, and Betadine prep. The procedure, risks, benefits, and alternatives of IVC filter placement were explained to the patient. Specific risks discussed include bleeding, infection, contrast reaction, renal failure, IVC filter fracture, migration, iliocaval thrombus (3-4% incidence), need for further procedure, need for further surgery, pulmonary embolism, cardiopulmonary collapse, death. Questions regarding the procedure were encouraged and answered. The patient understands  and consents to the procedure. Ultrasound survey of the right inguinal region was performed with images stored and sent to PACs, confirming patency of the vessel. A micropuncture needle was used access the right common femoral artery under ultrasound. With excellent arterial blood flow returned, and an .018 micro wire was passed through the needle, observed enter the abdominal aorta under fluoroscopy. The needle was removed, and a micropuncture sheath was placed over the wire. The inner dilator and wire were removed, and an 035 Bentson wire was advanced under fluoroscopy into the abdominal aorta. The sheath was removed and a standard 5 [place] vascular sheath was placed. The dilator was removed and the sheath  was flushed. Mickelson catheter was advanced on the Bentson wire. Catheter recur for was formed in the thoracic aorta. Catheter was withdrawn to the abdominal aorta and the wire was removed. Catheter was used to select the celiac artery. Angiogram was performed. High-flow catheter and a 14 fathom wire were advanced into the left gastric artery. Wire and the catheter were advanced into the replaced left hepatic artery. Angiogram was performed. Catheter was advanced to the division of segment 2 and segment 3. Angiogram was performed to confirm location. Embolization was then performed with a single vial of 500-700 micron embosphere to stasis. Repeat angiogram was performed. Microcatheter and the catheter were then removed. Ultrasound survey was performed of the common femoral vein with images stored and sent to PACs. Local anesthesia was provided with 1% Lidocaine. A single wall needle was used access the right common femoral vein under ultrasound. With excellent blood flow returned, a Bentson wire was passed through the needle, observed to enter the IVC under fluoroscopy. The needle was removed, and 10 [place] dilator was passed. The delivery sheath for a retrievable Bard Denali filter was passed over the Bentson  wire into the IVC. The wire was removed and IVC cavagram performed. Dilator was removed, and the IVC filter was then delivered, positioned below the lowest renal vein. Repeat cavagram was not performed. The delivery sheath was removed. Manual pressure was used for hemostasis. Exoseal was deployed at this point at the common femoral artery access point. Patient tolerated the procedure well and remained hemodynamically stable throughout. No complications were encountered and no significant blood loss was encounter. FINDINGS: Ultrasound demonstrates patent common femoral vein and patent common femoral artery on the right. Angiogram demonstrates the expected arrangement of the celiac artery, with patent common hepatic artery, splenic artery, and left gastric artery. There is a replaced left hepatic artery from the left gastric artery. 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 A sentinel clot was identified on the prior CT. Embolization was performed with 500-700 micron embospheres to stasis. After embolization was performed, retrievable IVC filter was placed below the lowest renal vein. This was deployed at the L1-L2 level. IMPRESSION: Status post ultrasound guided access right common femoral artery for hepatic angiogram and bland embolization of hemorrhagic left HCC to stasis, with Exoseal deployed for hemostasis at the common femoral artery puncture site. Status post ultrasound guided access right common femoral vein for deployment of retrievable IVC filter. Signed, Dulcy Fanny. Dellia Nims, RPVI [facility] Electronically Signed   By: Corrie Mckusick D.O.   On: 07/26/2020 17:29   DG Chest Port 1 View  Result Date: 07/28/2020 CLINICAL DATA:  Shortness of breath and productive cough. 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 EXAM:  PORTABLE CHEST 1 VIEW COMPARISON:  None. FINDINGS: No pneumothorax. Mild pulmonary venous congestion. Small bilateral pleural effusions with underlying atelectasis. No change in the cardiomediastinal silhouette. No other abnormalities. IMPRESSION: 1. Probable mild pulmonary venous congestion and small effusions. No other changes. Electronically Signed   By: Dorise Bullion III M.D   On: 07/28/2020 10:56   DG Chest Port 1 View  Result Date: 07/25/2020 CLINICAL DATA:  Abdominal hemorrhage EXAM: PORTABLE CHEST 1 VIEW COMPARISON:  Abdominal CT from yesterday. FINDINGS: Streaky density at the bases attributed atelectasis superimposed on chronic interstitial coarsening. There is no edema, consolidation, effusion, or pneumothorax. Normal heart size and mediastinal  contours. IMPRESSION: Atelectasis at the bases as seen on preceding abdominal CT. Electronically Signed   By: Monte Fantasia M.D.   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 Despite acute renal dysfunction, angiogram is indicated for life-saving embolization. 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 EXAM: ULTRASOUND-GUIDED ACCESS RIGHT COMMON FEMORAL ARTERY ULTRASOUND GUIDED ACCESS RIGHT COMMON FEMORAL VEIN MESENTERIC ANGIOGRAM EMBOLIZATION OF HEMORRHAGIC LEFT HEPATIC [place] RETRIEVABLE IVC FILTER PLACEMENT. EXOSEAL DEPLOYED FOR HEMOSTASIS MEDICATIONS: 4 mg Zofran. The antibiotic was administered within 1 hour of the procedure ANESTHESIA/SEDATION: Moderate (conscious) sedation was employed during this procedure. A total of Versed 2.0 mg and Fentanyl 100 mcg was administered intravenously. Moderate Sedation Time: 60 minutes. The patient's level of consciousness and vital signs were monitored continuously by radiology nursing  throughout the procedure under my direct supervision. CONTRAST:  46 cc FLUOROSCOPY TIME:  Fluoroscopy Time: 5 minutes 0 seconds COMPLICATIONS: None PROCEDURE: Informed consent was obtained from the patient following explanation of the procedure, risks, benefits and alternatives, regarding angiogram and embolization. Specifically, the chance of contrast induced nephropathy was reviewed. The patient understands, agrees and consents for the procedure. All questions were addressed. A time out was performed prior to the initiation of the procedure. Maximal barrier sterile technique utilized including caps, mask, sterile gowns, sterile gloves, large sterile drape, hand hygiene, and Betadine prep. The procedure, risks, benefits, and alternatives of IVC filter placement were explained to the patient. Specific risks discussed include bleeding, infection, contrast reaction, renal failure, IVC filter fracture, migration, iliocaval thrombus (3-4% incidence), need for further procedure, need for further surgery, pulmonary embolism, cardiopulmonary collapse, death. Questions regarding the procedure were encouraged and answered. The patient understands and consents to the procedure. Ultrasound survey of the right inguinal region was performed with images stored and sent to PACs, confirming patency of the vessel. A micropuncture needle was used access the right common femoral artery under ultrasound. With excellent arterial blood flow returned, and an .018 micro wire was passed through the needle, observed enter the abdominal aorta under fluoroscopy. The needle was removed, and a micropuncture sheath was placed over the wire. The inner dilator and wire were removed, and an 035 Bentson wire was advanced under fluoroscopy into the abdominal aorta. The sheath was removed and a standard 5 [place] vascular sheath was placed. The dilator was removed and the sheath was flushed. Mickelson catheter was advanced on the Bentson wire. Catheter  recur for was formed in the thoracic aorta. Catheter was withdrawn to the abdominal aorta and the wire was removed. Catheter was used to select the celiac artery. Angiogram was performed. High-flow catheter and a 14 fathom wire were advanced into the left gastric artery. Wire and the catheter were advanced into the replaced left hepatic artery. Angiogram was performed. Catheter was advanced to the division of segment 2 and segment 3. Angiogram was performed to confirm location. Embolization was then performed with a single vial of 500-700 micron embosphere to stasis. Repeat angiogram was performed. Microcatheter and the catheter were then removed. Ultrasound survey was performed of the common femoral vein with images stored and sent to PACs. Local anesthesia was provided with 1% Lidocaine. A single wall needle was used access the right common femoral vein under ultrasound. With excellent blood flow returned, a Bentson wire was  passed through the needle, observed to enter the IVC under fluoroscopy. The needle was removed, and 10 [place] dilator was passed. The delivery sheath for a retrievable Bard Denali filter was passed over the Bentson wire into the IVC. The wire was removed and IVC cavagram performed. Dilator was removed, and the IVC filter was then delivered, positioned below the lowest renal vein. Repeat cavagram was not performed. The delivery sheath was removed. Manual pressure was used for hemostasis. Exoseal was deployed at this point at the common femoral artery access point. Patient tolerated the procedure well and remained hemodynamically stable throughout. No complications were encountered and no significant blood loss was encounter. FINDINGS: Ultrasound demonstrates patent common femoral vein and patent common femoral artery on the right. Angiogram demonstrates the expected arrangement of the celiac artery, with patent common hepatic artery, splenic artery, and left gastric artery. There is a replaced  left hepatic artery from the left gastric artery. 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 A sentinel clot was identified on the prior CT. Embolization was performed with 500-700 micron embospheres to stasis. After embolization was performed, retrievable IVC filter was placed below the lowest renal vein. This was deployed at the L1-L2 level. IMPRESSION: Status post ultrasound guided access right common femoral artery for hepatic angiogram and bland embolization of hemorrhagic left HCC to stasis, with Exoseal deployed for hemostasis at the common femoral artery puncture site. Status post ultrasound guided access right common femoral vein for deployment of retrievable IVC filter. Signed, Dulcy Fanny. Dellia Nims, RPVI [facility] Radiology Electronically Signed   By: Corrie Mckusick D.O.   On: 07/26/2020 17:29   VAS [place] LOWER EXTREMITY VENOUS (DVT)  Result Date: 07/25/2020  Lower Venous DVTStudy Indications: Pulmonary embolism.  Risk Factors: Confirmed PE. Comparison Study: No prior studies. Performing Technologist: Oliver Hum RVT  Examination Guidelines: A complete evaluation includes B-mode imaging, spectral Doppler, color Doppler, and power Doppler as needed of all accessible portions of each vessel. Bilateral testing is considered an integral part of a complete examination. Limited examinations for reoccurring indications may be performed as noted. The reflux portion of the exam is performed with the patient in reverse Trendelenburg.  +---------+---------------+---------+-----------+----------+--------------+  RIGHT     Compressibility Phasicity Spontaneity Properties Thrombus Aging  +---------+---------------+---------+-----------+----------+--------------+  CFV       Full            Yes       Yes                                     +---------+---------------+---------+-----------+----------+--------------+  SFJ       Full                                                             +---------+---------------+---------+-----------+----------+--------------+  FV Prox   Full                                                             +---------+---------------+---------+-----------+----------+--------------+  FV Mid    Full                                                             +---------+---------------+---------+-----------+----------+--------------+  FV Distal Full                                                             +---------+---------------+---------+-----------+----------+--------------+  PFV       Full                                                             +---------+---------------+---------+-----------+----------+--------------+  POP       Full            Yes       Yes                                    +---------+---------------+---------+-----------+----------+--------------+  PTV       Full                                                             +---------+---------------+---------+-----------+----------+--------------+  PERO      Full                                                             +---------+---------------+---------+-----------+----------+--------------+   +---------+---------------+---------+-----------+----------+--------------+  LEFT      Compressibility Phasicity Spontaneity Properties Thrombus Aging  +---------+---------------+---------+-----------+----------+--------------+  CFV       Full            Yes       Yes                                    +---------+---------------+---------+-----------+----------+--------------+  SFJ       Full                                                             +---------+---------------+---------+-----------+----------+--------------+  FV Prox   Full                                                              +---------+---------------+---------+-----------+----------+--------------+  FV Mid    Full                                                             +---------+---------------+---------+-----------+----------+--------------+  FV Distal Full                                                             +---------+---------------+---------+-----------+----------+--------------+  PFV       Full                                                             +---------+---------------+---------+-----------+----------+--------------+  POP       Full            Yes       Yes                                    +---------+---------------+---------+-----------+----------+--------------+  PTV       Full                                                             +---------+---------------+---------+-----------+----------+--------------+  PERO      Full                                                             +---------+---------------+---------+-----------+----------+--------------+     Summary: RIGHT: - There is no evidence of deep vein thrombosis in the lower extremity.  - No cystic structure found in the popliteal fossa.  LEFT: - There is no evidence of deep vein thrombosis in the lower extremity.  - No cystic structure found in the popliteal fossa.  *See table(s) above for measurements and observations. Electronically signed by Monica Martinez MD on 07/25/2020 at 3:24:16 PM.    Final    CT Angio Abd/Pel w/ and/or w/o  Result Date: 07/26/2020 CLINICAL DATA:  History of extensive hepatocellular carcinoma, now with concern for hepatic hemorrhage. EXAM: CTA ABDOMEN AND PELVIS WITHOUT AND WITH CONTRAST TECHNIQUE: Multidetector CT imaging of the abdomen and pelvis was performed using the standard protocol during bolus administration of intravenous contrast. Multiplanar reconstructed images and MIPs were obtained and reviewed to evaluate the vascular anatomy. CONTRAST:  32m OMNIPAQUE IOHEXOL 350 MG/ML SOLN COMPARISON:  CT  abdomen and pelvis-earlier same day; 07/24/2020; 07/18/2020; chest CT - 07/19/2020; 04/24/2020 FINDINGS: VASCULAR Aorta: Moderate amount of mixed calcified and noncalcified atherosclerotic plaque within normal caliber abdominal aorta, not resulting in a hemodynamically significant  stenosis. No abdominal aortic dissection or periaortic stranding. Celiac: There is a minimal amount of eccentric calcified and noncalcified atherosclerotic plaque involving the origin the celiac artery, not definitely resulting in hemodynamically significant stenosis. Redemonstrated non conventional celiac arterial anatomy including an accessory though dominant left hepatic artery arising from the left gastric artery. Additionally, there is a separate origin of the GDA which arises directly from the celiac artery. SMA: Widely patent without hemodynamically significant narrowing. Renals: Solitary bilaterally; the bilateral renal arteries are widely patent without a hemodynamically significant narrowing. No vessel irregularity to suggest FMD. IMA: Remains patent. Inflow: Moderate amount of mixed calcified and noncalcified atherosclerotic plaque involves the bilateral common and external iliac arteries, not definitely resulting in hemodynamically significant stenosis. The bilateral internal iliac arteries are patent and of normal caliber. Proximal Outflow: The bilateral common and imaged portions of the bilateral superficial and deep femoral arteries appear patent and without hemodynamically significant narrowing, though note, evaluation degraded secondary to patient motion. Veins: The IVC and pelvic venous systems appear widely patent. Review of the MIP images confirms the above findings. _________________________________________________________ NON-VASCULAR Lower chest: Limited visualization of the lower thorax demonstrates minimal bibasilar subsegmental atelectasis, right greater than left. Note is made of a near occlusive filling defect  within the image right lower lobe pulmonary artery (image 10, series 4), compatible with pulmonary embolism, unchanged compared to the dedicated chest CT performed 07/19/2020. Hepatobiliary: Redemonstrated extensive multifocal hepatocellular carcinoma including near complete involvement of the posterior segment of the right lobe of the liver with infiltrative tumor. Active extravasation is seen about the caudal aspect of the lateral segment of the left lobe of the liver with subsequent pooling of extravasated contrast on acquired delayed phase images (image 79, series 4; image 79, series 6; image 82, series 7). The area of active extravasation appears to be predominantly supplied via the accessory (though dominant) left hepatic artery which arises from the left gastric artery. Portal vein remains patent. Normal appearance of the gallbladder. No radiopaque gallstones. No intra or extrahepatic biliary ductal dilatation. Redemonstrated hematoma within the ventral aspect of the upper abdominal omentum with dominant hematoma measuring approximately 10.1 x 4.5 cm (image 121, series 4), with associated moderate volume hemoperitoneum and ascites. Pancreas: Normal appearance of the pancreas. Spleen: Normal appearance of the spleen. No evidence of splenomegaly. Adrenals/Urinary Tract: There is symmetric enhancement of the bilateral kidneys. No definite renal stones on this postcontrast examination. No discrete renal lesions. No urinary obstruction or perinephric stranding. Normal appearance the bilateral adrenal glands. Normal appearance of the urinary bladder given degree distention. Stomach/Bowel: No evidence of enteric obstruction. No pneumoperitoneum, pneumatosis or portal venous gas. Lymphatic: Scattered porta hepatis lymph nodes are not enlarged by size criteria. No bulky retroperitoneal, mesenteric, pelvic or inguinal lymphadenopathy. Reproductive: Normal appearance of the prostate gland. Other: Mild diffuse body wall  anasarca. Musculoskeletal: Redemonstrated osseous metastatic disease including ill-defined lytic lesion involving the posterior elements of the right-side of the T9 vertebral body (image 34, series 4), an approximately 2.6 x 2.6 cm lytic lesion involving the T12 vertebral body (image 80, series 4), an at least 5.3 x 4.3 cm lytic lesion involving the right-side of L4 vertebral body and an approximately 3.6 x 3.5 cm lytic lesion involving the left side of the sacral ala (image 186, series). IMPRESSION: VASCULAR 1. The examination is positive for active extravasation about the caudal aspect of the lateral segment of the left lobe of the liver secondary to bleeding hepatocellular carcinoma with dominant arterial supply  via an accessory (though dominant) left hepatic artery arising from the left gastric artery. 2. Redemonstrated hematoma within the ventral aspect of the upper abdominal omentum with associated moderate volume hemoperitoneum and ascites. 3. 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 NON-VASCULAR 1. Similar appearance of known multifocal hepatocellular carcinoma. The portal vein remains patent. 2. Redemonstrated extensive osseous metastatic disease as above. Above findings reviewed with Dr. Earleen Newport at the time of examination completion. Electronically Signed   By: Sandi Mariscal M.D.   On: 07/26/2020 15:38    Time coordinating discharge: Over 30 minutes  SIGNED:   Guilford Shi, MD  [facility] 07/30/2020, 2:35 PM

## 2020-07-30 NOTE — Evaluation (Signed)
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  On 8/20, patient was seen in ED for worsening shortness of breath, found to have acute PE. admitted with Hemorrhagic shock due to intraperitoneal hemorrhage. underwent IVC filter placement  Clinical Impression  Patient evaluated by Physical Therapy with no further acute PT needs identified. All education has been completed and the patient has no further questions.  PT feels slightly more unsteady than his baseline, intermittent reliance on RW.  amb hallway distance, SpO2= 96-100% on RA.  Given 2 recent admissions and pt fully independent at his baseline--recommend HHPT and RW for use as needed. Pt is very pleasant and cooperative  See below for any follow-up Physical Therapy or equipment needs. PT is signing off. Thank you for this referral.     Follow Up Recommendations Home health PT    Equipment Recommendations  Rolling walker with 5" wheels    Recommendations for Other Services       Precautions / Restrictions Precautions Precautions: Fall Precaution Comments: O2 Restrictions Weight Bearing Restrictions: No      Mobility  Bed Mobility Overal bed mobility: Modified Independent                Transfers Overall transfer level: Needs assistance Equipment used: Rolling walker (2 wheeled) Transfers: Sit to/from Stand Sit to Stand: Supervision            Ambulation/Gait Ambulation/Gait assistance: Supervision Gait Distance (Feet): 300 Feet Assistive device: Rolling walker (2 wheeled);None Gait Pattern/deviations: Step-through pattern     General Gait Details: intermittently amb without RW support (lifting RW), supervision for  safety, no overt LOB. SpO2= 96-100% on RA  Stairs            Wheelchair Mobility    Modified Rankin (Stroke Patients Only)       Balance Overall balance assessment: Needs assistance   Sitting balance-Leahy Scale: Good       Standing balance-Leahy Scale: Fair                               Pertinent Vitals/Pain Pain Assessment: Faces Faces Pain Scale: Hurts a little bit Pain Location: R uper thigh intermittently Pain Descriptors / Indicators: Sore Pain Intervention(s): Limited activity within patient's tolerance;Monitored during session    Home Living Family/patient expects to be discharged to:: Private residence Living Arrangements: Other relatives (niece)   Type of Home: House Home Access: Stairs to enter   Technical brewer of Steps: 3 Home Layout: One level Home Equipment: None      Prior Function Level of Independence: Independent         Comments: able to drive, independent in mobility and ADLs     Hand Dominance        Extremity/Trunk Assessment   Upper Extremity Assessment Upper Extremity Assessment: Overall WFL for tasks assessed    Lower Extremity Assessment Lower Extremity Assessment: Overall WFL for tasks assessed       Communication   Communication: No difficulties  Cognition Arousal/Alertness: Awake/alert Behavior During Therapy: WFL for tasks assessed/performed Overall Cognitive Status: Within Functional Limits for tasks assessed  General Comments      Exercises     Assessment/Plan    PT Assessment All further PT needs can be met in the next venue of care (to d/c today)  PT Problem List         PT Treatment Interventions      PT Goals (Current goals can be found in the Care Plan section)  Acute Rehab PT Goals Patient Stated Goal: go home PT Goal Formulation: All assessment and education complete, DC therapy    Frequency     Barriers  to discharge        Co-evaluation               AM-PAC PT "6 Clicks" Mobility  Outcome Measure Help needed turning from your back to your side while in a flat bed without using bedrails?: None Help needed moving from lying on your back to sitting on the side of a flat bed without using bedrails?: None Help needed moving to and from a bed to a chair (including a wheelchair)?: None Help needed standing up from a chair using your arms (e.g., wheelchair or bedside chair)?: None Help needed to walk in hospital room?: A Little Help needed climbing 3-5 steps with a railing? : A Little 6 Click Score: 22    End of Session   Activity Tolerance: Patient tolerated treatment well Patient left: in bed;with call bell/phone within reach Nurse Communication: Mobility status PT Visit Diagnosis: Difficulty in walking, not elsewhere classified (R26.2)    Time: 4540-9811 PT Time Calculation (min) (ACUTE ONLY): 20 min   Charges:   PT Evaluation $PT Eval Low Complexity: Wilder, PT  Acute Rehab Dept ([place]) [phone] Pager [phone]  07/30/2020   [facility] 07/30/2020, 12:40 PM

## 2020-08-01 ENCOUNTER — Telehealth: Payer: Self-pay | Admitting: Hematology

## 2020-08-01 ENCOUNTER — Telehealth: Payer: Self-pay

## 2020-08-01 NOTE — Telephone Encounter (Signed)
Scheduled appt per 9/2 sch msg - pt is aware of apt date and time  ° °

## 2020-08-01 NOTE — Telephone Encounter (Signed)
Vernon [name] called asking if he could pick up his oxycodone from the pharmacy because he had not picked it up on 8/23 when prescribed.  I referred him to the [facility] pharmacy where the prescription had been sent.

## 2020-08-13 ENCOUNTER — Inpatient Hospital Stay: Payer: Medicaid Other | Attending: Hematology | Admitting: Hematology

## 2020-08-13 ENCOUNTER — Inpatient Hospital Stay: Payer: Medicaid Other

## 2020-08-20 ENCOUNTER — Telehealth: Payer: Self-pay

## 2020-08-20 NOTE — Telephone Encounter (Signed)
Vernon [name] called requesting refill for pain medication.  He stated he wasn't sure if he should called [place] or hospice.  I phoned [name] at [facility].  She states that their hospice physician is the attending physician.  She states she will take care of his refill.

## 2020-08-23 ENCOUNTER — Ambulatory Visit: Payer: Medicaid Other | Admitting: Physician Assistant

## 2020-09-17 ENCOUNTER — Other Ambulatory Visit: Payer: Self-pay | Admitting: Interventional Radiology

## 2020-09-17 DIAGNOSIS — I2699 Other pulmonary embolism without acute cor pulmonale: Secondary | ICD-10-CM

## 2020-09-26 ENCOUNTER — Other Ambulatory Visit: Payer: Medicaid Other

## 2020-10-03 ENCOUNTER — Other Ambulatory Visit: Payer: Medicaid Other

## 2020-10-03 ENCOUNTER — Inpatient Hospital Stay: Admission: RE | Admit: 2020-10-03 | Payer: Medicaid Other | Source: Ambulatory Visit

## 2020-10-30 DEATH — deceased
